# Patient Record
Sex: Female | Born: 1990 | Race: Black or African American | Hispanic: No | Marital: Married | Smoking: Never smoker
Health system: Southern US, Community
[De-identification: ages and names within clinical notes are randomized; demographics above are authoritative.]

## PROBLEM LIST (undated history)

## (undated) ENCOUNTER — Emergency Department: Admission: EM | Payer: Managed Care, Other (non HMO) | Source: Home / Self Care

## (undated) DIAGNOSIS — I10 Essential (primary) hypertension: Secondary | ICD-10-CM

## (undated) DIAGNOSIS — F32A Depression, unspecified: Secondary | ICD-10-CM

## (undated) DIAGNOSIS — F329 Major depressive disorder, single episode, unspecified: Secondary | ICD-10-CM

## (undated) DIAGNOSIS — N390 Urinary tract infection, site not specified: Secondary | ICD-10-CM

## (undated) DIAGNOSIS — K759 Inflammatory liver disease, unspecified: Secondary | ICD-10-CM

## (undated) HISTORY — DX: Major depressive disorder, single episode, unspecified: F32.9

## (undated) HISTORY — DX: Depression, unspecified: F32.A

## (undated) HISTORY — DX: Essential (primary) hypertension: I10

## (undated) HISTORY — DX: Urinary tract infection, site not specified: N39.0

## (undated) HISTORY — PX: TONSILLECTOMY: SUR1361

---
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
## 2011-12-12 ENCOUNTER — Other Ambulatory Visit (HOSPITAL_COMMUNITY): Payer: Self-pay | Admitting: Obstetrics & Gynecology

## 2011-12-12 DIAGNOSIS — R102 Pelvic and perineal pain: Secondary | ICD-10-CM

## 2011-12-12 DIAGNOSIS — Z975 Presence of (intrauterine) contraceptive device: Secondary | ICD-10-CM

## 2011-12-17 ENCOUNTER — Ambulatory Visit (HOSPITAL_COMMUNITY)
Admission: RE | Admit: 2011-12-17 | Discharge: 2011-12-17 | Disposition: A | Discharge status: Home / Self Care | Payer: BC Managed Care – PPO | Source: Ambulatory Visit | Attending: Obstetrics & Gynecology | Admitting: Obstetrics & Gynecology

## 2011-12-17 DIAGNOSIS — Z975 Presence of (intrauterine) contraceptive device: Secondary | ICD-10-CM

## 2011-12-17 DIAGNOSIS — N949 Unspecified condition associated with female genital organs and menstrual cycle: Secondary | ICD-10-CM | POA: Insufficient documentation

## 2011-12-17 DIAGNOSIS — N83209 Unspecified ovarian cyst, unspecified side: Secondary | ICD-10-CM | POA: Insufficient documentation

## 2011-12-17 DIAGNOSIS — Z30431 Encounter for routine checking of intrauterine contraceptive device: Secondary | ICD-10-CM | POA: Insufficient documentation

## 2011-12-17 DIAGNOSIS — R102 Pelvic and perineal pain: Secondary | ICD-10-CM

## 2012-05-05 ENCOUNTER — Telehealth: Payer: Self-pay | Admitting: *Deleted

## 2012-05-05 NOTE — Telephone Encounter (Signed)
message

## 2012-05-05 NOTE — Telephone Encounter (Signed)
New patient will see you on 05/14/12 to have IUD removed, she had ultrasound done back in dec 2013( result in epic) due to pelvic pain and was told that her IUD was in placed but the strings are in her cervix? Patient is concerned if this will be a  problem with removal?  Please advise

## 2012-05-07 NOTE — Telephone Encounter (Signed)
Telephone call, states has had some problems with elevated blood pressure. Since she's had the IUD she is having more back pain, cramping, acne, and is generally not felt well. Would like removed. Will keep scheduled appointment.

## 2012-05-14 ENCOUNTER — Ambulatory Visit (INDEPENDENT_AMBULATORY_CARE_PROVIDER_SITE_OTHER): Payer: BC Managed Care – PPO | Admitting: Women's Health

## 2012-05-14 ENCOUNTER — Encounter: Payer: Self-pay | Admitting: Women's Health

## 2012-05-14 VITALS — BP 134/94 | Ht 71.0 in | Wt 250.0 lb

## 2012-05-14 DIAGNOSIS — L709 Acne, unspecified: Secondary | ICD-10-CM

## 2012-05-14 DIAGNOSIS — I10 Essential (primary) hypertension: Secondary | ICD-10-CM

## 2012-05-14 DIAGNOSIS — L708 Other acne: Secondary | ICD-10-CM

## 2012-05-14 DIAGNOSIS — Z309 Encounter for contraceptive management, unspecified: Secondary | ICD-10-CM

## 2012-05-14 DIAGNOSIS — Z975 Presence of (intrauterine) contraceptive device: Secondary | ICD-10-CM

## 2012-05-14 MED ORDER — CLINDAMYCIN PHOSPHATE 1 % EX GEL: Freq: Two times a day (BID) | CUTANEOUS | Status: DC

## 2012-05-14 NOTE — Progress Notes (Signed)
Patient ID: Samantha Glass, female   DOB: 10/17/1990, 22 y.o.   MRN: [id number] Presents requesting removal of Mirena due to cramping, back ache, fatigue, moodiness, weight gain and acne since placement in September 2013. Weight has been between 225 and 250 for past 5 years.  Blood pressure had elevations on combination birth control pills and since on Mirena. Started HCTZ one week ago,  some improvement. Sexually active with first partner, he had negative STD screen. Previously on Jolessa for menorrhagia, stopped due to increased blood pressure. Had a ultrasound at primary care that showed a IUD in proper place but strings inside os. Depression- Stress related. Managed by psychiatrist. 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  Exam: appears well, obese. Speculum exam: pink healthy cervix with no lesions, Mirena string visible at os.  Numerous complaints regarding Mirena IUD Depression Obesity Hypertension newly diagnosed  Plan: Discussed contraceptive options, reviewed cannot start combination birth control at this time due to blood pressure. Will continue Mirena until blood pressure is controlled with HCTZ and then consider switching to combination oral contraceptives. Clindamycin 1% gel apply topically twice daily for acne, and schedule appointment with dermatologist. Encouraged healthy diet and exercise for weight loss, blood pressure control, and overall health.  Will continue Wellbutrin XL 300 MG through transition to grad school then consider decreasing dose per psychiatrist.

## 2012-05-14 NOTE — Patient Instructions (Signed)

## 2012-06-04 ENCOUNTER — Ambulatory Visit (INDEPENDENT_AMBULATORY_CARE_PROVIDER_SITE_OTHER): Payer: BC Managed Care – PPO | Admitting: Women's Health

## 2012-06-04 ENCOUNTER — Encounter: Payer: Self-pay | Admitting: Women's Health

## 2012-06-04 VITALS — BP 134/90

## 2012-06-04 DIAGNOSIS — Z309 Encounter for contraceptive management, unspecified: Secondary | ICD-10-CM

## 2012-06-04 NOTE — Progress Notes (Signed)
Patient ID: Samantha Glass, female   DOB: 05/31/1990, 22 y.o.   MRN: [id number] Presents to have IUD removed. States has back pain week prior to her cycle, several days of light spotting with cramping. Has 2 weeks of feeling well. Currently on HCTZ for blood pressure which ranges from 130s/80s to 120s/70s. Today 134/90. One partner negative STD screen. 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  Exam: Appears well,  Contraception management Hypertension  Plan: Options reviewed, progestin only pill, nexplanon, condoms, after discussion in review of possible side effects declined having IUD removed. States may have more problems with other options. Instructed to call if would like to have removed and change contraception. Reviewed risks of blood clots and strokes with combination birth control pills with hypertension. Continue Motrin and Aleve as needed for discomfort.

## 2012-06-13 ENCOUNTER — Ambulatory Visit (INDEPENDENT_AMBULATORY_CARE_PROVIDER_SITE_OTHER): Payer: BC Managed Care – PPO | Admitting: Women's Health

## 2012-06-13 ENCOUNTER — Ambulatory Visit (INDEPENDENT_AMBULATORY_CARE_PROVIDER_SITE_OTHER): Payer: BC Managed Care – PPO

## 2012-06-13 ENCOUNTER — Other Ambulatory Visit: Payer: Self-pay | Admitting: Women's Health

## 2012-06-13 ENCOUNTER — Encounter: Payer: Self-pay | Admitting: Women's Health

## 2012-06-13 DIAGNOSIS — Z30431 Encounter for routine checking of intrauterine contraceptive device: Secondary | ICD-10-CM

## 2012-06-13 DIAGNOSIS — B373 Candidiasis of vulva and vagina: Secondary | ICD-10-CM

## 2012-06-13 DIAGNOSIS — N946 Dysmenorrhea, unspecified: Secondary | ICD-10-CM

## 2012-06-13 DIAGNOSIS — Z975 Presence of (intrauterine) contraceptive device: Secondary | ICD-10-CM

## 2012-06-13 MED ORDER — DOXYCYCLINE HYCLATE 100 MG PO CAPS: 100.0000 mg | ORAL_CAPSULE | Freq: Two times a day (BID) | ORAL | Status: DC

## 2012-06-13 MED ORDER — FLUCONAZOLE 150 MG PO TABS: 150.0000 mg | ORAL_TABLET | Freq: Once | ORAL | Status: DC

## 2012-06-13 MED ORDER — KETOROLAC TROMETHAMINE 30 MG/ML IJ SOLN
30.0000 mg | Freq: Once | INTRAMUSCULAR | Status: AC
  Administered 2012-06-13: 30 mg via INTRAVENOUS

## 2012-06-13 MED ORDER — DOXYCYCLINE HYCLATE 50 MG PO CAPS: 50.0000 mg | ORAL_CAPSULE | Freq: Two times a day (BID) | ORAL | Status: DC

## 2012-06-13 NOTE — Progress Notes (Signed)
Patient ID: Samantha Glass, female   DOB: 06-12-1990, 22 y.o.   MRN: [id number] Presents to have Mirena IUD removed. [facility]rena placed by [facility] and has had cramping, backache and spotting since placement for at least 2 weeks of every month. Hypertension on HCTZ per primary care. Denies vaginal discharge, urinary symptoms, abdominal pain or fever. Plans to use condoms for contraception.  Exam: Appears well, external genitalia within normal limits, has a 3 cm birthmark on right labia/unchanged. Speculum exam, IUD strings not visible attempted to grab strings in cervix unable. Dr. Lily Peer in and with ultrasound guidance removed Mirena IUD intact and discarded. Tolerated well,   Mirena IUD removed  Plan: Toradol 30 g IM for discomfort. Vibramycin 100 mg twice daily for 7 days #14, Diflucan 150 by mouth x1 dose if becomes symptomatic of yeast after antibiotic given. Plan B emergency contraception reviewed for condom failure.

## 2017-12-10 ENCOUNTER — Encounter (INDEPENDENT_AMBULATORY_CARE_PROVIDER_SITE_OTHER): Payer: Self-pay | Admitting: Family Medicine

## 2017-12-10 ENCOUNTER — Ambulatory Visit (FREE_STANDING_LABORATORY_FACILITY): Payer: BLUE CROSS/BLUE SHIELD | Admitting: Family Medicine

## 2017-12-10 VITALS — BP 129/84 | HR 88 | Temp 98.9°F | Ht 71.0 in | Wt 262.0 lb

## 2017-12-10 DIAGNOSIS — E6609 Other obesity due to excess calories: Secondary | ICD-10-CM

## 2017-12-10 DIAGNOSIS — I10 Essential (primary) hypertension: Secondary | ICD-10-CM

## 2017-12-10 DIAGNOSIS — Z6836 Body mass index (BMI) 36.0-36.9, adult: Secondary | ICD-10-CM

## 2017-12-10 DIAGNOSIS — N946 Dysmenorrhea, unspecified: Secondary | ICD-10-CM

## 2017-12-10 DIAGNOSIS — Z0000 Encounter for general adult medical examination without abnormal findings: Secondary | ICD-10-CM

## 2017-12-10 DIAGNOSIS — F329 Major depressive disorder, single episode, unspecified: Secondary | ICD-10-CM

## 2017-12-10 DIAGNOSIS — F32A Depression, unspecified: Secondary | ICD-10-CM

## 2017-12-10 DIAGNOSIS — D500 Iron deficiency anemia secondary to blood loss (chronic): Secondary | ICD-10-CM

## 2017-12-10 LAB — COMPREHENSIVE METABOLIC PANEL
ALT: 10 U/L (ref 0–55)
AST (SGOT): 11 U/L (ref 5–34)
Albumin/Globulin Ratio: 1 (ref 0.9–2.2)
Albumin: 3.7 g/dL (ref 3.5–5.0)
Alkaline Phosphatase: 62 U/L (ref 37–106)
BUN: 6 mg/dL — ABNORMAL LOW (ref 7.0–19.0)
Bilirubin, Total: 0.4 mg/dL (ref 0.2–1.2)
CO2: 27 mEq/L (ref 21–29)
Calcium: 9.3 mg/dL (ref 8.5–10.5)
Chloride: 102 mEq/L (ref 100–111)
Creatinine: 0.8 mg/dL (ref 0.4–1.5)
Globulin: 3.8 g/dL — ABNORMAL HIGH (ref 2.0–3.7)
Glucose: 88 mg/dL (ref 70–100)
Potassium: 3.9 mEq/L (ref 3.5–5.1)
Protein, Total: 7.5 g/dL (ref 6.0–8.3)
Sodium: 139 mEq/L (ref 136–145)

## 2017-12-10 LAB — CBC AND DIFFERENTIAL
Absolute NRBC: 0 10*3/uL (ref 0.00–0.00)
Basophils Absolute Automated: 0.03 10*3/uL (ref 0.00–0.08)
Basophils Automated: 0.3 %
Eosinophils Absolute Automated: 0.09 10*3/uL (ref 0.00–0.44)
Eosinophils Automated: 1 %
Hematocrit: 41.8 % (ref 34.7–43.7)
Hgb: 13.6 g/dL (ref 11.4–14.8)
Immature Granulocytes Absolute: 0.03 10*3/uL (ref 0.00–0.07)
Immature Granulocytes: 0.3 %
Lymphocytes Absolute Automated: 2.14 10*3/uL (ref 0.42–3.22)
Lymphocytes Automated: 24.3 %
MCH: 30 pg (ref 25.1–33.5)
MCHC: 32.5 g/dL (ref 31.5–35.8)
MCV: 92.1 fL (ref 78.0–96.0)
MPV: 9.9 fL (ref 8.9–12.5)
Monocytes Absolute Automated: 0.57 10*3/uL (ref 0.21–0.85)
Monocytes: 6.5 %
Neutrophils Absolute: 5.94 10*3/uL (ref 1.10–6.33)
Neutrophils: 67.6 %
Nucleated RBC: 0 /100 WBC (ref 0.0–0.0)
Platelets: 344 10*3/uL (ref 142–346)
RBC: 4.54 10*6/uL (ref 3.90–5.10)
RDW: 13 % (ref 11–15)
WBC: 8.8 10*3/uL (ref 3.10–9.50)

## 2017-12-10 LAB — HEMOLYSIS INDEX: Hemolysis Index: 3 (ref 0–18)

## 2017-12-10 LAB — TSH: TSH: 1.97 u[IU]/mL (ref 0.35–4.94)

## 2017-12-10 LAB — LIPID PANEL
Cholesterol / HDL Ratio: 2.5
Cholesterol: 165 mg/dL (ref 0–199)
HDL: 66 mg/dL (ref 40–9999)
LDL Calculated: 64 mg/dL (ref 0–99)
Triglycerides: 174 mg/dL — ABNORMAL HIGH (ref 34–149)
VLDL Calculated: 35 mg/dL (ref 10–40)

## 2017-12-10 LAB — GFR: EGFR: >60

## 2017-12-10 MED ORDER — AMLODIPINE BESYLATE 10 MG PO TABS
10.0000 mg | ORAL_TABLET | Freq: Every day | ORAL | 1 refills | Status: DC
Start: 2017-12-10 — End: 2018-05-27

## 2017-12-10 MED ORDER — BUPROPION HCL ER (XL) 300 MG PO TB24
300.00 mg | ORAL_TABLET | Freq: Every morning | ORAL | 1 refills | Status: DC
Start: 2017-12-10 — End: 2018-05-27

## 2017-12-10 MED ORDER — HYDROCHLOROTHIAZIDE 25 MG PO TABS
25.0000 mg | ORAL_TABLET | Freq: Every day | ORAL | 1 refills | Status: DC
Start: 2017-12-10 — End: 2018-05-27

## 2017-12-10 NOTE — Patient Instructions (Signed)
Eating Heart-Healthy Foods  Eating has a big impact on your heart health. In fact, eating healthier can improve several of your heart risks at once. For instance, it helps you manage weight, cholesterol, and blood pressure. Here are ideas to help you make heart-healthy changes without giving up allthe foods and flavors you love.  Getting started   Talk with your healthcare provider about eating plans, such as the DASH or Mediterranean diet. You may also be referred to a dietitian.   Change a few things at a time. Give yourself time to get used to a few eating changes before adding more.   Work to create a tasty, healthy eating plan that you can stick to for the rest of your life.    Goals for healthy eating  Below are some tips to improve your eating habits:   Limit saturated fats and trans fats. Saturated fats raise your levels of cholesterol, so keep these fats to a minimum. They are found in foods such as fatty meats, whole milk, cheese, and palm and coconut oils. Avoid trans fats because they lower good cholesterol as well as raise bad cholesterol. Trans fats are most often found in processed foods.   Reduce sodium (salt) intake. Eating too much salt may increase your blood pressure. Limit your sodium intake to 2,300 milligrams (mg) per day(the amount in 1 teaspoon of salt), or less if your healthcare provider recommends it. Dining out less often and eating fewer processed foods are two great ways to decrease the amount of salt you consume.   Managing calories. A calorie is a unit of energy. Your body burns calories for fuel, but if you eat more calories than your body burns, the extras are stored as fat. Your healthcare provider can help you create a diet plan to manage your calories. This will likely include eating healthier foods as well as exercising regularly. To help you track your progress, keep a diary to record what you eat and how often you exercise.  Choose the right foods  Aim to make these  foods staples of your diet. If you have diabetes, you may have different recommendations than what is listed here:   Fruits and vegetables provide plenty of nutrients without a lot of calories. At meals, fill half your plate with these foods. Split the other half of your plate between whole grains and lean protein.   Whole grains are high in fiber and rich in vitamins and nutrients. Good choices include whole-wheat bread, pasta, and brown rice.   Lean proteins give you nutrition with less fat. Good choices include fish, skinless chicken, and beans.   Low-fat or nonfat dairy provides nutrients without a lot of fat. Try low-fat or nonfat milk, cheese, or yogurt.   Healthy fats can be good for you in small amounts. These are unsaturated fats, such as olive oil, nuts, and fish. Try to have at least 2 servings per week of fatty fish, such as salmon, sardines, mackerel, rainbow trout, and albacore tuna. These contain omega-3 fatty acids, which are good for your heart. Flaxseed is another source of a heart-healthy fat.  More on heart-healthy eating  Read food labels  Healthy eating starts at the grocery store. Be sure to pay attention to food labels on packaged foods. Look for products that are high in fiber and protein, and low in saturated fat, cholesterol, and sodium. Avoid products that contain trans fat. And pay close attention to serving size. For instance, if you plan   to eat two servings, double all the numbers on the label.  Prepare food right  A key part of healthy cooking is cutting down on added fat and salt. Look on the internet for lower-fat, lower-sodium recipes. Also, try these tips:   Remove fat from meat and skin from poultry before cooking.   Skim fat from the surface of soups and sauces.   Broil, boil, bake, steam, grill, and microwave food without added fats.   Choose ingredients that spice up your food without adding calories, fat, or sodium. Try these items: horseradish, hot sauce, lemon,  mustard, nonfat salad dressings, and vinegar. For salt-free herbs and spices, try basil, cilantro, cinnamon, pepper, and rosemary.  [employer]Well last reviewed this educational content on 10/02/2015   2000-2019 [employer] [address], [place] [address]. All rights reserved. This information is not intended as a substitute for professional medical care. Always follow your healthcare professional's instructions.        Aerobic Exercise for a Healthy Heart  Exercise is a lot more than an energy booster and a stress reliever. It also strengthens your heart muscle, lowers your blood pressure and cholesterol, and burns calories. It can also improve your resting muscle tone, and your mood.  Choose an aerobic activity  Choose an activity that makes your heart and lungs work harder than they do when you rest or walk normally. This aerobic exercise can improve the way your heart and other muscles use oxygen. Make it fun by exercising with a friend and choosing an activity you enjoy. Here are some ideas:   Walking   Swimming   Bicycling   Stair climbing   Dancing   Jogging   Gardening  Exercise regularly  If you haven't been exercising regularly, get your doctor's OK first. Then start slowly. Here are some tips:   Begin exercising 3 times a week for 5 to 10minutes at a time.   When you feel comfortable, add a few minutes each session.   Slowly build up to exercising 3 to 4 times each week. Each session should last for 40 minutes, on average, and involve moderate- to vigorous-intensity physical activity.   Your goal should be at least 30 minutes of moderate-intensity aerobic activity at least 5 days per week for a total of 150 or at least 25 minutes of vigorous aerobic activity at least 3 days per week for a total of 75 minutes   If you have been given nitroglycerin, be sure to carry it when you exercise.   If you get chest pain (angina) when you're exercising, stop what you're doing, take  your nitroglycerin, and call your doctor.  StayWell last reviewed this educational content on 06/03/2014   2000-2019 [employer] [address], [place] [address]. All rights reserved. This information is not intended as a substitute for professional medical care. Always follow your healthcare professional's instructions.

## 2017-12-10 NOTE — Progress Notes (Signed)
Have you seen any specialists/other providers since your last visit with [place]?    No    Arm preference verified?   Yes, no preference    Health Maintenance Due   Topic Date Due   . PAP SMEAR  Jun 28, 1990

## 2017-12-10 NOTE — Progress Notes (Signed)
Subjective:      Patient ID: Ashley Brock is a 27 y.o. female.    Chief Complaint:  Chief Complaint   Patient presents with   . Annual Exam     fasting       HPI:  Patient presents to clinic to establish care.     Was previously a Consulting civil engineer at [employer] and was receiving her primary care there.     Her last gynecologist visit was in [place] and was seen in 2016. She has a PMHx of dysmenorrhea since 2011 and eczema that she treats with OTC steroid ointment. She does take birth control and has been for 11 years, to control her menstrual cycle and accompanying pain. She still experiences mild cramping but otherwise her symptoms are controlled with the birth control.       Hypertension   The patient is being seen for a routine follow-up of hypertension. Hypertension is classified as chronic, essential and stage 1 hypertension. Comorbid illnesses include hyperlipidemia, diabetes and obesity. There are no recent interval events.  There is no interval history of chest pain, dyspnea and lower extremity edema.  Current therapy includes calcium channel blockers and diuretics. Patient is compliant with the current regimen.   The patient's blood pressure response to medications is at goal. Lifestyle changes have included improved nutrition. The patient reports exercising 60 min/day, 1-2 days/week. kicking She's been doing low impact exercises the last few weeks since injuring her knee        The following sections were reviewed this encounter by the provider:   Tobacco  Allergies  Meds  Problems  Med Hx  Surg Hx  Fam Hx  Soc Hx          Allergies:  No Known Allergies    Current Medications:  Current Outpatient Medications   Medication Sig Dispense Refill   . amLODIPine (NORVASC) 10 MG tablet Take 1 tablet (10 mg total) by mouth daily 90 tablet 1   . buPROPion XL (WELLBUTRIN XL) 300 MG 24 hr tablet Take 1 tablet (300 mg total) by mouth every morning 90 tablet 1   . hydroCHLOROthiazide (HYDRODIURIL) 25 MG tablet  Take 1 tablet (25 mg total) by mouth daily 90 tablet 1   . iron-vitamin C (VITRON-C) 65-125 MG Tab Take by mouth     . Norgestim-Eth Estrad Triphasic (TRI-LO-MARZIA) 0.18/0.215/0.25 MG-25 MCG Tab        No current facility-administered medications for this visit.        Past Medical History:  Past Medical History:   Diagnosis Date   . Depression    . Hypertension        Past Surgical History:  Past Surgical History:   Procedure Laterality Date   . TONSILLECTOMY  2009   . TONSILLECTOMY Bilateral 2009       Family History:  Family History   Problem Relation Age of Onset   . Hypertension Mother    . Hypertension Father    . No known problems Brother    . No known problems Maternal Grandmother    . Alcohol abuse Maternal Grandfather    . Hyperlipidemia Maternal Grandfather    . Hypertension Paternal Grandmother    . Mental illness Paternal Grandmother    . No known problems Paternal Grandfather        Social History:  Social History     Socioeconomic History   . Marital status: Married     Spouse name: Not on  file   . Number of children: Not on file   . Years of education: 21 years   . Highest education level: Doctorate   Occupational History   . Occupation: Office manager    Social Needs   . Financial resource strain: Not hard at all   . Food insecurity:     Worry: Never true     Inability: Never true   . Transportation needs:     Medical: No     Non-medical: No   Tobacco Use   . Smoking status: Never Smoker   . Smokeless tobacco: Never Used   Substance and Sexual Activity   . Alcohol use: Yes     Alcohol/week: 2.0 standard drinks     Types: 2 Glasses of wine per week   . Drug use: Not Currently   . Sexual activity: Yes     Partners: Male     Birth control/protection: OCP   Lifestyle   . Physical activity:     Days per week: Not on file     Minutes per session: Not on file   . Stress: Not on file   Relationships   . Social connections:     Talks on phone: Not on file     Gets together: Not on file     Attends  religious service: Not on file     Active member of club or organization: Not on file     Attends meetings of clubs or organizations: Not on file     Relationship status: Not on file   . Intimate partner violence:     Fear of current or ex partner: Not on file     Emotionally abused: Not on file     Physically abused: Not on file     Forced sexual activity: Not on file   Other Topics Concern   . Not on file   Social History Narrative    She was doing kick boxing for 1 hr per session. 2 days/week         ROS:  Review of Systems   Constitutional: Negative for activity change, appetite change, fever and unexpected weight change.   HENT: Negative for rhinorrhea.    Respiratory: Negative for cough and shortness of breath.    Cardiovascular: Negative for chest pain.   Genitourinary: Positive for menstrual problem (dysmenorrhea since 2011).   Skin: Negative for rash.   All other systems reviewed and are negative.       Vitals:  BP 129/84 (BP Site: Right arm, Patient Position: Sitting, Cuff Size: Large)   Pulse 88   Temp 98.9 F (37.2 C) (Oral)   Ht 1.803 m (5\' 11" )   Wt 118.8 kg (262 lb)   LMP 11/21/2017   BMI 36.54 kg/m     Objective:     Physical Exam:  Physical Exam  Vitals signs reviewed.   Constitutional:       General: She is not in acute distress.     Appearance: Normal appearance. She is obese.   HENT:      Head: Atraumatic.      Right Ear: Tympanic membrane, ear canal and external ear normal.      Left Ear: Tympanic membrane, ear canal and external ear normal.      Nose: Nose normal.      Mouth/Throat:      Mouth: Mucous membranes are moist.      Pharynx: Oropharynx is clear.   Eyes:  General: No scleral icterus.     Conjunctiva/sclera: Conjunctivae normal.      Pupils: Pupils are equal, round, and reactive to light.   Neck:      Musculoskeletal: Neck supple.   Cardiovascular:      Rate and Rhythm: Normal rate and regular rhythm.      Pulses: Normal pulses.           Radial pulses are 2+ on the right  side and 2+ on the left side.        Dorsalis pedis pulses are 2+ on the right side and 2+ on the left side.      Heart sounds: Normal heart sounds. No murmur.   Pulmonary:      Effort: Pulmonary effort is normal. No respiratory distress.      Breath sounds: Normal breath sounds.   Abdominal:      General: Bowel sounds are normal.      Palpations: Abdomen is soft.      Tenderness: There is no tenderness.   Musculoskeletal:      Right lower leg: No edema.      Left lower leg: No edema.   Skin:     General: Skin is warm and dry.      Comments: Per patient, notes eczema      Neurological:      Mental Status: She is alert and oriented to person, place, and time.      GCS: GCS eye subscore is 4. GCS verbal subscore is 5. GCS motor subscore is 6.   Psychiatric:         Attention and Perception: Attention normal.         Mood and Affect: Mood normal.         Behavior: Behavior normal. Behavior is cooperative.         Assessment:       1. Annual physical exam  - CBC and differential  - Comprehensive Metabolic Panel  - Hemoglobin A1c  - Lipid panel  - TSH    2. Essential hypertension  - amLODIPine (NORVASC) 10 MG tablet; Take 1 tablet (10 mg total) by mouth daily  Dispense: 90 tablet; Refill: 1  - hydroCHLOROthiazide (HYDRODIURIL) 25 MG tablet; Take 1 tablet (25 mg total) by mouth daily  Dispense: 90 tablet; Refill: 1    3. Dysmenorrhea    4. Iron deficiency anemia due to chronic blood loss    5. Depression, unspecified depression type  - buPROPion XL (WELLBUTRIN XL) 300 MG 24 hr tablet; Take 1 tablet (300 mg total) by mouth every morning  Dispense: 90 tablet; Refill: 1    6. Class 2 obesity due to excess calories without serious comorbidity with body mass index (BMI) of 36.0 to 36.9 in adult      Plan:     Cardiovascular risk assessment labs ordered today. She didn't want to have her pap today, she'll return for that in the next few months. She reports a h/o normal pap smears in the past.    New to me, but a chronic  condition. Relatively controlled on amlodipine 10mg  and 25mg  HCTZ daily. Continue heart healthy lifestyle habits and gradually return to moderate intensity exercise as knee pain resolves.     New to me, chronic to patient. Persisting. Continue current OCP regimen (Tri-lo-marzia). She gets this script filled by a remote provider.     New to me, but chronic. CBC ordered to evaluate H/H. Takes vitron C. Continue to  monitor.     New to me, but a chronic condition. Stable and improved on wellbutrin 300mg  once daily.     New to me, but a chronic problem. Persisting. Heart healthy habits and weight loss efforts reviewed. Advised to reduced intake of carbohydrates (starches, sugars), decrease the consumption of sweetened beverages, and monitor portion control. Increase physical activity.     Flu vaccine was declined in office today. Universal precautions to reduce infections reviewed.        Return in about 3 months (around 03/11/2018) for pap smear.    By signing my name below, I, Takia Runyon, Scribe, attest that this documentation has been prepared under the direction and in the presence of Geradine Girt, MD.    I have reviewed the chart and agree that the record accurately reflects my personal performance of the history, physical exam, discussion and plan.

## 2017-12-11 ENCOUNTER — Encounter (INDEPENDENT_AMBULATORY_CARE_PROVIDER_SITE_OTHER): Payer: Self-pay | Admitting: Family Medicine

## 2017-12-11 LAB — HEMOGLOBIN A1C
Average Estimated Glucose: 96.8 mg/dL
Hemoglobin A1C: 5 % (ref 4.6–5.9)

## 2017-12-16 ENCOUNTER — Telehealth (INDEPENDENT_AMBULATORY_CARE_PROVIDER_SITE_OTHER): Payer: Self-pay | Admitting: Family Medicine

## 2017-12-16 NOTE — Telephone Encounter (Signed)
Patient called regarding recent results for labs. Please return call at noted phone number below and okay to leave detailed message with information.      Patient Preferred Callback Number: [phone]

## 2017-12-16 NOTE — Telephone Encounter (Signed)
Sp with Pt. Pt made aware.See result note

## 2018-02-12 ENCOUNTER — Encounter (INDEPENDENT_AMBULATORY_CARE_PROVIDER_SITE_OTHER): Payer: Self-pay | Admitting: Family Medicine

## 2018-02-12 ENCOUNTER — Ambulatory Visit (INDEPENDENT_AMBULATORY_CARE_PROVIDER_SITE_OTHER): Payer: Commercial Managed Care - POS | Admitting: Family Medicine

## 2018-02-12 VITALS — BP 133/84 | HR 106 | Temp 98.4°F | Ht 71.0 in | Wt 262.4 lb

## 2018-02-12 DIAGNOSIS — R1013 Epigastric pain: Secondary | ICD-10-CM

## 2018-02-12 LAB — CBC
Absolute NRBC: 0 10*3/uL (ref 0.00–0.00)
Hematocrit: 42.2 % (ref 34.7–43.7)
Hgb: 13.8 g/dL (ref 11.4–14.8)
MCH: 29.8 pg (ref 25.1–33.5)
MCHC: 32.7 g/dL (ref 31.5–35.8)
MCV: 91.1 fL (ref 78.0–96.0)
MPV: 10.3 fL (ref 8.9–12.5)
Nucleated RBC: 0 /100 WBC (ref 0.0–0.0)
Platelets: 357 10*3/uL — ABNORMAL HIGH (ref 142–346)
RBC: 4.63 10*6/uL (ref 3.90–5.10)
RDW: 12 % (ref 11–15)
WBC: 7.46 10*3/uL (ref 3.10–9.50)

## 2018-02-12 LAB — LIPASE: Lipase: 23 U/L (ref 8–78)

## 2018-02-12 NOTE — Patient Instructions (Signed)
Gastritis (Adult)    Gastritis isinflammation andirritation of the stomach lining. You can have it for a short time (acute) or be long lasting (chronic). Infection with bacteria calledH pylori most often causes gastritis.More than a third of people in the [place] have these bacteria in their bodies. In many cases,H pyloricauses no problems or symptoms. In some people, though, the infection irritates the stomach lining and causes gastritis. H. pylori may be diagnosed through blood, stool, or breath tests, we well as through biopsy during an endoscopy. Other causes of stomach irritation include drinking alcohol, smoking or chewing tobacco, or taking pain-relieving medicines called NSAIDs (such as aspirin or ibuprofen). Certain drugs (such as cocaine) and immune conditions can also cause gastritis.  Symptoms of gastritis can include:   Belly pain or bloating   Feeling full quickly   Loss of appetite   Nausea or vomiting   Vomiting blood or having black stools   Feeling more tired than usual  An inflamed and irritated stomach lining is more likely to develop a sore called an ulcer. To help prevent this, gastritis should be treated.  Home care  If needed, our healthcare provider may prescribe medicines. If you haveH pyloriinfection, treating it will likely relieve your symptoms. Other changes can help reduce stomach irritation and help it heal.   If you have been prescribed medicines forH pyloriinfection, take them as directed. Take all of the medicine until it is finished or your healthcare provider tells you to stop, even if you feel better.   Your healthcare provider may advise you not to take NSAIDs. If you take daily aspirin for your heart or other medical reasons, do not stop without talking to your healthcare provider first.   Don't drink alcohol.   Stop smoking. Smoking can irritate the stomach and delay healing. As much as possible, stay away from second hand smoke.  Follow-up care  Follow up  with your healthcare provider, or as advised by our staff. You may need testing to check for inflammation or an ulcer.  When to seek medical advice  Call your healthcare provider for any of the following:   Stomach pain that gets worse or moves to the lower right belly (appendix area)   Chest pain that appears or gets worse, or spreads to the back, neck, shoulder, or arm   Frequent vomiting (can't keep down liquids)   Blood in the stool or vomit (red or black in color)   Feeling weak or dizzy   Shortness of breath   Unexplained weight loss   Fever of 100.4F (38C) or higher, or as directed by your healthcare provider  [employer]Well last reviewed this educational content on 03/01/2016   2000-2019 [employer] [address], [place] [address]. All rights reserved. This information is not intended as a substitute for professional medical care. Always follow your healthcare professional's instructions.

## 2018-02-12 NOTE — Progress Notes (Signed)
Have you seen any specialists/other providers since your last visit with [place]?    Yes-ortho    Arm preference verified?   Yes, no preference    Health Maintenance Due   Topic Date Due   . PAP SMEAR  1990-09-06   . [facility] CARE PLAN LETTER  11-02-90

## 2018-02-12 NOTE — Progress Notes (Signed)
Subjective:      Patient ID: Ashley Brock is a 28 y.o. female.    Chief Complaint:  Chief Complaint   Patient presents with   . Abdominal Pain     Pt c/o upper mid abd pain x 2 weeks.        HPI:  Abdominal Pain   This is a new problem. Episode onset: 2 weeks ago. Episode frequency: Consistent pain, with slight gaps in between. The problem has been unchanged. The pain is located in the epigastric region. The abdominal pain does not radiate. Associated symptoms include nausea (slight). Pertinent negatives include no diarrhea, fever, hematuria or vomiting. Treatments tried: She has tried OTC treatments like Tums and ginger tea, which has provided mild relief. She reports having a similar episode a couple months ago, and believes her current episode may be due to a recent dietary modification with a lower caloric intake. There is no history of pancreatitis or ulcerative colitis.   Of note she also had an ankle sprain recently and had taken a high-dose ibuprofen regimen five weeks ago. She currently drinks two alcoholic beverages a week, and does not drink coffee.    The following sections were reviewed this encounter by the provider:        Current Medications:  Current Outpatient Medications   Medication Sig Dispense Refill   . amLODIPine (NORVASC) 10 MG tablet Take 1 tablet (10 mg total) by mouth daily 90 tablet 1   . buPROPion XL (WELLBUTRIN XL) 300 MG 24 hr tablet Take 1 tablet (300 mg total) by mouth every morning 90 tablet 1   . hydroCHLOROthiazide (HYDRODIURIL) 25 MG tablet Take 1 tablet (25 mg total) by mouth daily 90 tablet 1   . iron-vitamin C (VITRON-C) 65-125 MG Tab Take by mouth     . Norgestim-Eth Estrad Triphasic (TRI-LO-MARZIA) 0.18/0.215/0.25 MG-25 MCG Tab        No current facility-administered medications for this visit.        Allergies:  No Known Allergies      ROS:  Review of Systems   Constitutional: Negative for activity change, fever and unexpected weight change.   Respiratory: Negative for  cough and shortness of breath.    Cardiovascular: Negative for chest pain and palpitations.   Gastrointestinal: Positive for abdominal distention, abdominal pain, blood in stool (only a few times) and nausea (slight). Negative for diarrhea and vomiting.   Genitourinary: Negative for hematuria.        Objective:     Vitals:  BP 133/84 (BP Site: Left arm, Patient Position: Sitting, Cuff Size: Large)   Pulse (!) 106   Temp 98.4 F (36.9 C) (Oral)   Ht 1.803 m (5\' 11" )   Wt 119 kg (262 lb 6.4 oz)   LMP 01/12/2018   BMI 36.60 kg/m     Physical Exam:  Physical Exam  Vitals signs reviewed.   Constitutional:       Appearance: Normal appearance.   HENT:      Head: Atraumatic.      Nose: Nose normal.   Eyes:      Conjunctiva/sclera: Conjunctivae normal.   Neck:      Musculoskeletal: Neck supple.   Cardiovascular:      Rate and Rhythm: Normal rate and regular rhythm.      Pulses: Normal pulses.      Heart sounds: Normal heart sounds.   Pulmonary:      Effort: Pulmonary effort is normal.  Breath sounds: Normal breath sounds.   Skin:     General: Skin is warm and dry.   Neurological:      Mental Status: She is alert and oriented to person, place, and time.   Psychiatric:         Mood and Affect: Mood normal.         Behavior: Behavior normal.         Assessment:       1. Epigastric abdominal pain  - CBC without differential  - Lipase      Plan:     New. Likely gastritis. Start omeprazole therapy combined with dietary changes. Continue to avoid acidic/spicy foods, alcohol, and coffee to reduce inflammation. Continue taking Tums prn. CBC and lipase labs ordered to evaluate for pancreatitis, infection/inflammation. Continue to monitor.    Return in about 1 month (around 03/13/2018) for if symptoms worsen or fail to improve or sooner.    By signing my name below, I, Kayleanna Lorman Pandora Leiter, Scribe, attest that this documentation has been prepared under the direction and in the presence of Geradine Girt, MD.    I have reviewed the chart  and agree that the record accurately reflects my personal performance of the history, physical exam, discussion and plan.    Emelda Brothers, MD

## 2018-05-25 ENCOUNTER — Other Ambulatory Visit (INDEPENDENT_AMBULATORY_CARE_PROVIDER_SITE_OTHER): Payer: Self-pay | Admitting: Family Medicine

## 2018-05-25 DIAGNOSIS — I10 Essential (primary) hypertension: Secondary | ICD-10-CM

## 2018-05-25 DIAGNOSIS — F32A Depression, unspecified: Secondary | ICD-10-CM

## 2018-06-11 ENCOUNTER — Encounter (INDEPENDENT_AMBULATORY_CARE_PROVIDER_SITE_OTHER): Payer: Self-pay | Admitting: Family Medicine

## 2018-06-11 ENCOUNTER — Telehealth (INDEPENDENT_AMBULATORY_CARE_PROVIDER_SITE_OTHER): Payer: Commercial Managed Care - POS | Admitting: Family Medicine

## 2018-06-11 DIAGNOSIS — R0789 Other chest pain: Secondary | ICD-10-CM

## 2018-06-11 NOTE — Progress Notes (Signed)
Have you seen any specialists/other providers since your last visit with [place]?    No    Do you agree to telemedicine visit?  Yes    Arm preference verified?   No    Health Maintenance Due   Topic Date Due   . PAP SMEAR  08/22/1990   . [facility] CARE PLAN LETTER  1990/02/03

## 2018-06-13 NOTE — Progress Notes (Signed)
Subjective:      Patient ID: Ashley Brock is a 28 y.o. female.    Chief Complaint:  Chief Complaint   Patient presents with   . Arm Pain     Pt c/o left under arm pain x 3 weeks.        HPI:  Verbal consent has been obtained from the patient to conduct a video and telephone visit to minimize exposure to COVID-19: yes.    HPI    Left underam pain  New. Started a few weeks ago. No redness, heat or tenderness to palpation. She denies injury. No pain with range of motion. Hasn't tried anything to make her symptoms go away. She's unable to feel a lymph node.   .   The following sections were reviewed this encounter by the provider:   Allergies  Meds  Problems         Current Medications:  Current Outpatient Medications   Medication Sig Dispense Refill   . amLODIPine (NORVASC) 10 MG tablet TAKE 1 TABLET BY MOUTH EVERY DAY 90 tablet 0   . buPROPion XL (WELLBUTRIN XL) 300 MG 24 hr tablet TAKE 1 TABLET BY MOUTH EVERY DAY IN THE MORNING 90 tablet 0   . hydroCHLOROthiazide (HYDRODIURIL) 25 MG tablet TAKE 1 TABLET BY MOUTH EVERY DAY 90 tablet 0   . iron-vitamin C (VITRON-C) 65-125 MG Tab Take by mouth     . Norgestim-Eth Estrad Triphasic (TRI-LO-MARZIA) 0.18/0.215/0.25 MG-25 MCG Tab        No current facility-administered medications for this visit.        Allergies:  No Known Allergies      ROS:  Review of Systems   Constitutional: Negative for activity change and fever.   Respiratory: Negative for cough and shortness of breath.    Cardiovascular: Negative for chest pain.   Musculoskeletal:        Left axillary pain        Objective:     Vitals:  LMP 06/04/2018     Physical Exam:  Physical Exam  Constitutional:       General: She is not in acute distress.     Appearance: She is not ill-appearing.   HENT:      Right Ear: External ear normal.      Left Ear: External ear normal.   Eyes:      General: No scleral icterus.  Neck:      Musculoskeletal: Neck supple.   Pulmonary:      Effort: Pulmonary effort is normal.  No respiratory distress.   Chest:       Neurological:      Mental Status: She is alert and oriented to person, place, and time.         Assessment:       1. Left-sided chest wall pain      Plan:     New. Etiology unclear. Possibly muscle strain. Recommended she try heating the area, can ice it a swell and alternate it with heat. Take tylenol or ibuprofen prn. If symptoms persist, will have her come into the office, perform breast and chest exam. Imaging may be warranted. Continue to monitor.     Time spent in discussion: 5 minutes    Return in about 1 month (around 07/11/2018) for if symptoms worsen or fail to improve or sooner.    Emelda Brothers, MD

## 2018-08-21 ENCOUNTER — Other Ambulatory Visit (INDEPENDENT_AMBULATORY_CARE_PROVIDER_SITE_OTHER): Payer: Self-pay | Admitting: Family Medicine

## 2018-08-21 DIAGNOSIS — I10 Essential (primary) hypertension: Secondary | ICD-10-CM

## 2018-08-21 DIAGNOSIS — F32A Depression, unspecified: Secondary | ICD-10-CM

## 2018-08-25 ENCOUNTER — Other Ambulatory Visit (INDEPENDENT_AMBULATORY_CARE_PROVIDER_SITE_OTHER): Payer: Self-pay | Admitting: Family Medicine

## 2018-08-25 DIAGNOSIS — I10 Essential (primary) hypertension: Secondary | ICD-10-CM

## 2018-08-25 DIAGNOSIS — F32A Depression, unspecified: Secondary | ICD-10-CM

## 2018-08-26 ENCOUNTER — Other Ambulatory Visit (INDEPENDENT_AMBULATORY_CARE_PROVIDER_SITE_OTHER): Payer: Self-pay | Admitting: Family Medicine

## 2018-08-26 DIAGNOSIS — F32A Depression, unspecified: Secondary | ICD-10-CM

## 2018-08-26 DIAGNOSIS — I10 Essential (primary) hypertension: Secondary | ICD-10-CM

## 2018-11-22 ENCOUNTER — Other Ambulatory Visit (INDEPENDENT_AMBULATORY_CARE_PROVIDER_SITE_OTHER): Payer: Self-pay | Admitting: Family Medicine

## 2018-11-22 DIAGNOSIS — I10 Essential (primary) hypertension: Secondary | ICD-10-CM

## 2018-11-22 DIAGNOSIS — F32A Depression, unspecified: Secondary | ICD-10-CM

## 2018-12-24 ENCOUNTER — Telehealth: Payer: Self-pay | Admitting: Family Medicine

## 2018-12-24 NOTE — Telephone Encounter (Signed)
Pt stated that she took a double dosage of her medication (amlodipine and iron pill) and stated that she feels a drop in blood pressure.

## 2018-12-24 NOTE — Telephone Encounter (Signed)
Spoke with pt, she usually take Amlodipine 10 mg in the evening but this morning she accidentally took one tablet. Pt has been monitoring her BP 125/80, 113/78, most recent reading about 10 min ago 116/75. Per Dr. Everardo Beals pt advised to continue monitoring BP, hydrate well and pt can take her evening dose as normal. Pt verbalized understanding.

## 2018-12-24 NOTE — Telephone Encounter (Signed)
Sign

## 2019-01-02 HISTORY — PX: GALLBLADDER SURGERY: SHX652

## 2019-02-25 ENCOUNTER — Other Ambulatory Visit (INDEPENDENT_AMBULATORY_CARE_PROVIDER_SITE_OTHER): Payer: Self-pay | Admitting: Family Medicine

## 2019-02-25 DIAGNOSIS — I10 Essential (primary) hypertension: Secondary | ICD-10-CM

## 2019-02-25 DIAGNOSIS — F32A Depression, unspecified: Secondary | ICD-10-CM

## 2019-02-27 ENCOUNTER — Other Ambulatory Visit (INDEPENDENT_AMBULATORY_CARE_PROVIDER_SITE_OTHER): Payer: Self-pay | Admitting: Family Medicine

## 2019-02-27 DIAGNOSIS — I10 Essential (primary) hypertension: Secondary | ICD-10-CM

## 2019-03-20 ENCOUNTER — Ambulatory Visit: Payer: BC Managed Care – PPO | Attending: Internal Medicine

## 2019-03-20 DIAGNOSIS — Z23 Encounter for immunization: Secondary | ICD-10-CM

## 2019-03-20 NOTE — Progress Notes (Signed)
   Covid-19 Vaccination Clinic  Name:  JAVIONNA LEDER    MRN: [id number] DOB: 06-18-1990  03/20/2019  Ms. Pecina was observed post Covid-19 immunization for 15 minutes without incident. She was provided with Vaccine Information Sheet and instruction to access the V-Safe system.   Ms. Stormer was instructed to call 911 with any severe reactions post vaccine: Marland Kitchen Difficulty breathing  . Swelling of face and throat  . A fast heartbeat  . A bad rash all over body  . Dizziness and weakness   Immunizations Administered    Name Date Dose VIS Date Route   Pfizer COVID-19 Vaccine 03/20/2019  4:12 PM 0.3 mL 12/12/2018 Intramuscular   Manufacturer: [employer], [employer]   Lot: [id number]   NDC: [address]-7

## 2019-04-01 ENCOUNTER — Other Ambulatory Visit (INDEPENDENT_AMBULATORY_CARE_PROVIDER_SITE_OTHER): Payer: Self-pay | Admitting: Family Medicine

## 2019-04-01 DIAGNOSIS — I10 Essential (primary) hypertension: Secondary | ICD-10-CM

## 2019-04-01 NOTE — Telephone Encounter (Signed)
This electronic refill request was received for Dr. King who appears to be inactive in Epic. Your office have assisted this patient in the past.  Can you please review and reply to the pharmacy for this medication or refer to another provider?    Thank you,  Dolores Ouedraogo  Epic Ambulatory Certified System Analyst, Sr

## 2019-04-14 ENCOUNTER — Encounter: Payer: Self-pay | Admitting: Nurse Practitioner

## 2019-04-14 ENCOUNTER — Other Ambulatory Visit: Payer: Self-pay

## 2019-04-14 ENCOUNTER — Ambulatory Visit: Payer: Managed Care, Other (non HMO) | Admitting: Nurse Practitioner

## 2019-04-14 VITALS — BP 112/80 | HR 102 | Temp 97.9°F | Ht 71.0 in | Wt 245.2 lb

## 2019-04-14 DIAGNOSIS — I10 Essential (primary) hypertension: Secondary | ICD-10-CM

## 2019-04-14 DIAGNOSIS — E669 Obesity, unspecified: Secondary | ICD-10-CM

## 2019-04-14 DIAGNOSIS — E66811 Obesity, class 1: Secondary | ICD-10-CM

## 2019-04-14 DIAGNOSIS — F325 Major depressive disorder, single episode, in full remission: Secondary | ICD-10-CM | POA: Diagnosis not present

## 2019-04-14 DIAGNOSIS — D509 Iron deficiency anemia, unspecified: Secondary | ICD-10-CM

## 2019-04-14 DIAGNOSIS — D500 Iron deficiency anemia secondary to blood loss (chronic): Secondary | ICD-10-CM

## 2019-04-14 HISTORY — DX: Obesity, unspecified: E66.9

## 2019-04-14 HISTORY — DX: Iron deficiency anemia, unspecified: D50.9

## 2019-04-14 HISTORY — DX: Obesity, class 1: E66.811

## 2019-04-14 HISTORY — DX: Major depressive disorder, single episode, in full remission: F32.5

## 2019-04-14 MED ORDER — HYDROCHLOROTHIAZIDE 25 MG PO TABS: 25.0000 mg | ORAL_TABLET | Freq: Every day | ORAL | 3 refills | Status: DC

## 2019-04-14 MED ORDER — BUPROPION HCL ER (XL) 300 MG PO TB24: 300.0000 mg | ORAL_TABLET | Freq: Every day | ORAL | 3 refills | Status: DC

## 2019-04-14 MED ORDER — AMLODIPINE BESYLATE 10 MG PO TABS: 10.0000 mg | ORAL_TABLET | Freq: Every day | ORAL | 3 refills | Status: DC

## 2019-04-14 NOTE — Progress Notes (Addendum)
New Patient Office Visit  Subjective:  Patient ID: Samantha Glass, female    DOB: May 08, 1990  Age: 29 y.o. MRN: [id number]  CC:  Chief Complaint  Patient presents with  . Establish Care    HPI Samantha Glass is a 29 year old who presents to establish care.   HTN: She is a history of hypertension Dx 04/2012 and started on HCTZ. She presents today on HCTZ  25 mg daily, amlodipine 10 mg daily.  She is doing well on this therapy and has had no chest pain, palpitations, ankle swelling, shortness of breath, dizziness or lightheadedness.  Blood pressure is well controlled at home.  BP Readings from Last 3 Encounters:  04/14/19 112/80  06/04/12 134/90  05/14/12 (!) 134/94    Depression: She presents on Wellbutrin XL 300 mg daily.  Not seen Psych in a while but plans to est care- Dx with depression as a sophomore in colleg. She reports she is doing fairly well, but has had some increased stressors recently with moving, new job in [employer] pandemic. Married and lives with her husband and in safe relationship.   IDA: Vitron C- couple years. Hx of heavier menstrual cycles- super tampon in 2-3 hours x 7 days. She tried IUD- gained 30 lbs, hair loss, terrible cramps, punctured the uterus and removed.  She thinks IUD contributed to HTN. Needs PAP test: normal prior  Contraception: [place] birth control on- line  Obesity: BMI 34 Lost 20 lbs goal weight  is 220 using Noom.  Lives with husband.  She feels well, eating a healthy diet.  [place] 03/2017 and gets second vaccine  tomorrow   Past Medical History:  Diagnosis Date  . Depression   . Depression, major, single episode, complete remission ([place]) 04/14/2019  . Hypertension   . IDA (iron deficiency anemia) 04/14/2019  . Obesity (BMI 30.0-34.9) 04/14/2019  . UTI (urinary tract infection)     Past Surgical History:  Procedure Laterality Date  . TONSILLECTOMY  age 17    Family History  Problem Relation Age of Onset  .  Hypertension Mother   . Depression Mother   . Hypertension Maternal Grandmother   . Arthritis Maternal Grandmother   . Diabetes Paternal Grandmother   . Hypertension Paternal Grandmother   . Hyperlipidemia Paternal Grandmother   . Hypertension Paternal Grandfather     Social History   Socioeconomic History  . Marital status: Married    Spouse name: Not on file  . Number of children: Not on file  . Years of education: Not on file  . Highest education level: Not on file  Occupational History  . Not on file  Tobacco Use  . Smoking status: Never Smoker  . Smokeless tobacco: Never Used  Substance and Sexual Activity  . Alcohol use: Yes    Comment: occassioanlly  . Drug use: No  . Sexual activity: Yes  Other Topics Concern  . Not on file  Social History Narrative  . Not on file   Social Determinants of Health   Financial Resource Strain:   . Difficulty of Paying Living Expenses:   Food Insecurity:   . Worried About Charity fundraiser in the Last Year:   . Arboriculturist in the Last Year:   Transportation Needs:   . Film/video editor (Medical):   Marland Kitchen Lack of Transportation (Non-Medical):   Physical Activity:   . Days of Exercise per Week:   . Minutes of Exercise per Session:  Stress:   . Feeling of Stress :   Social Connections:   . Frequency of Communication with Friends and Family:   . Frequency of Social Gatherings with Friends and Family:   . Attends Religious Services:   . Active Member of Clubs or Organizations:   . Attends Archivist Meetings:   Marland Kitchen Marital Status:   Intimate Partner Violence:   . Fear of Current or Ex-Partner:   . Emotionally Abused:   Marland Kitchen Physically Abused:   . Sexually Abused:     ROS Review of Systems  Constitutional: Negative for chills and fever.  HENT: Negative for congestion and sore throat.   Eyes: Negative.   Respiratory: Negative for cough and shortness of breath.   Cardiovascular: Negative for chest pain,  palpitations and leg swelling.  Gastrointestinal: Negative for abdominal pain, blood in stool, constipation, diarrhea and nausea.  Endocrine: Negative for cold intolerance and heat intolerance.  Genitourinary: Negative for difficulty urinating, frequency and urgency.  Musculoskeletal: Negative.  Negative for arthralgias.  Skin: Negative.   Allergic/Immunologic: Positive for environmental allergies.       Occasional - no treatment needed.   Neurological: Positive for headaches. Negative for dizziness and light-headedness.       Bad HA once a month never Dx with migraines. She turns off lights and closes her eyes. Not noticed a/w menses, maybe a/w computer.   Hematological: Negative for adenopathy. Does not bruise/bleed easily.  Psychiatric/Behavioral:       Some depression feelings of sadness, loss of interest, wakes up that way-x1-3 times a month, trouble focusing on work. No SI or HI.     Objective:   Today's Vitals: BP 112/80   Pulse (!) 102   Temp 97.9 F (36.6 C) (Temporal)   Ht '5\' 11"'  (1.803 m)   Wt 245 lb 3.2 oz (111.2 kg)   BMI 34.20 kg/m   Physical Exam Vitals reviewed.  Constitutional:      Appearance: Normal appearance.  HENT:     Head: Normocephalic and atraumatic.  Eyes:     Extraocular Movements: Extraocular movements intact.     Conjunctiva/sclera: Conjunctivae normal.     Pupils: Pupils are equal, round, and reactive to light.  Cardiovascular:     Rate and Rhythm: Normal rate and regular rhythm.     Pulses: Normal pulses.     Heart sounds: Normal heart sounds.  Pulmonary:     Effort: Pulmonary effort is normal.     Breath sounds: Normal breath sounds.  Abdominal:     Palpations: Abdomen is soft.     Tenderness: There is no abdominal tenderness.  Musculoskeletal:        General: Normal range of motion.     Cervical back: Normal range of motion and neck supple.  Skin:    General: Skin is warm and dry.  Neurological:     General: No focal deficit  present.     Mental Status: She is alert and oriented to person, place, and time.  Psychiatric:        Attention and Perception: Attention normal.        Mood and Affect: Mood and affect normal.        Thought Content: Thought content normal.        Cognition and Memory: Cognition normal.        Judgment: Judgment normal.     Comments: PHQ-9:4      Assessment & Plan:   Problem List Items Addressed This  Visit      Cardiovascular and Mediastinum   Essential hypertension, benign - Primary   Relevant Medications   amLODipine (NORVASC) 10 MG tablet   hydrochlorothiazide (HYDRODIURIL) 25 MG tablet   Other Relevant Orders   Comp Met (CMET)   Lipid Profile     Other   Depression, major, single episode, complete remission (HCC)   Relevant Medications   buPROPion (WELLBUTRIN XL) 300 MG 24 hr tablet   Other Relevant Orders   Ambulatory referral to Psychiatry   Obesity (BMI 30.0-34.9)   Relevant Orders   TSH   Lipid Profile   HgB A1c   IDA (iron deficiency anemia)   Relevant Orders   CBC with Differential/Platelet   IBC + Ferritin      Outpatient Encounter Medications as of 04/14/2019  Medication Sig  . amLODipine (NORVASC) 10 MG tablet Take 1 tablet (10 mg total) by mouth daily.  Marland Kitchen buPROPion (WELLBUTRIN XL) 300 MG 24 hr tablet Take 1 tablet (300 mg total) by mouth daily.  . hydrochlorothiazide (HYDRODIURIL) 25 MG tablet Take 1 tablet (25 mg total) by mouth daily.  . Norgestimate-Ethinyl Estradiol Triphasic (TRI-SPRINTEC) 0.18/0.215/0.25 MG-35 MCG tablet Take 1 tablet by mouth daily.  . [DISCONTINUED] amLODipine (NORVASC) 10 MG tablet Take 10 mg by mouth daily.  . [DISCONTINUED] buPROPion (WELLBUTRIN XL) 300 MG 24 hr tablet Take 300 mg by mouth daily.  . [DISCONTINUED] hydrochlorothiazide (HYDRODIURIL) 25 MG tablet Take 25 mg by mouth daily.  . [DISCONTINUED] doxycycline (VIBRAMYCIN) 100 MG capsule Take 1 capsule (100 mg total) by mouth 2 (two) times daily. (Patient not taking:  Reported on 04/14/2019)  . [DISCONTINUED] fluconazole (DIFLUCAN) 150 MG tablet Take 1 tablet (150 mg total) by mouth once. (Patient not taking: Reported on 04/14/2019)  . [DISCONTINUED] hydrochlorothiazide (MICROZIDE) 12.5 MG capsule Take 12.5 mg by mouth daily.   No facility-administered encounter medications on file as of 04/14/2019.  I have refilled the medications that we discussed.  Please go to the lab today for routine blood work.   I placed a referral into Dr. Cherene Altes in psychiatry.  Her office will contact you for an appointment.  Let me know if you have not heard back from them in a week.  Good job with weight loss and continue with Noom.  Healthy eating and activity.  Follow-up in June for preventative healthcare including Pap Test.  Follow-up: Return in about 2 months (around 06/14/2019).   Denice Paradise, NP

## 2019-04-14 NOTE — Patient Instructions (Addendum)
It was a pleasure to meet you today.  I have refilled the medications that we discussed.  Please go to the lab today for routine blood work.   I placed a referral into Dr. Arvilla Market in psychiatry.  Her office will contact you for an appointment.  Let me know if you have not heard back from them in a week.  Good job with weight loss and continue with Noom.  Healthy eating and activity.  Follow-up in June for preventative healthcare including Pap Test.

## 2019-04-14 NOTE — Telephone Encounter (Signed)
hydroCHLOROthiazide (HYDRODIURIL) 25 MG tablet    Last Refilled: 03/04/19 (30 day supply)  Last Appt: 06/11/2018    Called s/w patient who stated that she [place] and has sinced found a new PCP.

## 2019-04-15 ENCOUNTER — Encounter: Payer: Self-pay | Admitting: Nurse Practitioner

## 2019-04-15 ENCOUNTER — Ambulatory Visit: Payer: Managed Care, Other (non HMO) | Attending: Internal Medicine

## 2019-04-15 DIAGNOSIS — Z23 Encounter for immunization: Secondary | ICD-10-CM

## 2019-04-15 LAB — COMPREHENSIVE METABOLIC PANEL
ALT: 14 U/L (ref 0–35)
AST: 14 U/L (ref 0–37)
Albumin: 4.4 g/dL (ref 3.5–5.2)
Alkaline Phosphatase: 52 U/L (ref 39–117)
BUN: 7 mg/dL (ref 6–23)
CO2: 28 mEq/L (ref 19–32)
Calcium: 9.8 mg/dL (ref 8.4–10.5)
Chloride: 99 mEq/L (ref 96–112)
Creatinine, Ser: 0.76 mg/dL (ref 0.40–1.20)
GFR: 109.06 mL/min (ref >60.00)
Glucose, Bld: 78 mg/dL (ref 70–99)
Potassium: 3.4 mEq/L — ABNORMAL LOW (ref 3.5–5.1)
Sodium: 136 mEq/L (ref 135–145)
Total Bilirubin: 0.6 mg/dL (ref 0.2–1.2)
Total Protein: 7.8 g/dL (ref 6.0–8.3)

## 2019-04-15 LAB — CBC WITH DIFFERENTIAL/PLATELET
Basophils Absolute: 0.1 10*3/uL (ref 0.0–0.1)
Basophils Relative: 0.8 % (ref 0.0–3.0)
Eosinophils Absolute: 0.1 10*3/uL (ref 0.0–0.7)
Eosinophils Relative: 0.9 % (ref 0.0–5.0)
HCT: 40.6 % (ref 36.0–46.0)
Hemoglobin: 13.6 g/dL (ref 12.0–15.0)
Lymphocytes Relative: 32.7 % (ref 12.0–46.0)
Lymphs Abs: 2.6 10*3/uL (ref 0.7–4.0)
MCHC: 33.4 g/dL (ref 30.0–36.0)
MCV: 91.8 fl (ref 78.0–100.0)
Monocytes Absolute: 0.6 10*3/uL (ref 0.1–1.0)
Monocytes Relative: 7 % (ref 3.0–12.0)
Neutro Abs: 4.7 10*3/uL (ref 1.4–7.7)
Neutrophils Relative %: 58.6 % (ref 43.0–77.0)
Platelets: 365 10*3/uL (ref 150.0–400.0)
RBC: 4.42 Mil/uL (ref 3.87–5.11)
RDW: 12.7 % (ref 11.5–15.5)
WBC: 8 10*3/uL (ref 4.0–10.5)

## 2019-04-15 LAB — LIPID PANEL
Cholesterol: 149 mg/dL (ref 0–200)
HDL: 58.3 mg/dL (ref >39.00)
LDL Cholesterol: 71 mg/dL (ref 0–99)
NonHDL: 90.97
Total CHOL/HDL Ratio: 3
Triglycerides: 98 mg/dL (ref 0.0–149.0)
VLDL: 19.6 mg/dL (ref 0.0–40.0)

## 2019-04-15 LAB — TSH: TSH: 1.95 u[IU]/mL (ref 0.35–4.50)

## 2019-04-15 LAB — IBC + FERRITIN
Ferritin: 25 ng/mL (ref 10.0–291.0)
Iron: 105 ug/dL (ref 42–145)
Saturation Ratios: 19.6 % — ABNORMAL LOW (ref 20.0–50.0)
Transferrin: 382 mg/dL — ABNORMAL HIGH (ref 212.0–360.0)

## 2019-04-15 LAB — HEMOGLOBIN A1C: Hgb A1c MFr Bld: 5.1 % (ref 4.6–6.5)

## 2019-04-15 NOTE — Progress Notes (Signed)
   Covid-19 Vaccination Clinic  Name:  Samantha Glass    MRN: [id number] DOB: 07-04-1990  04/15/2019  [name] was observed post Covid-19 immunization for 15 minutes without incident. She was provided with Vaccine Information Sheet and instruction to access the V-Safe system.   [name] was instructed to call 911 with any severe reactions post vaccine: Marland Kitchen Difficulty breathing  . Swelling of face and throat  . A fast heartbeat  . A bad rash all over body  . Dizziness and weakness   Immunizations Administered    Name Date Dose VIS Date Route   Pfizer COVID-19 Vaccine 04/15/2019  4:04 PM 0.3 mL 12/12/2018 Intramuscular   Manufacturer: [employer], [employer]   Lot: [id number]   NDC: [address]-1

## 2019-04-15 NOTE — Assessment & Plan Note (Addendum)
Check labs. No longer having heavy menses. Gets Contraception: [facility] birth control on- line Follow-up in June for preventative healthcare including Pap Test.

## 2019-04-15 NOTE — Assessment & Plan Note (Signed)
  Good job with weight loss and continue with Noom.  Healthy eating and activity.

## 2019-04-15 NOTE — Assessment & Plan Note (Signed)
Referral to psychiatry.  She is Wellbutrin XL 300 mg, still has some depression feelings, loss of interest,  trouble focusing at work.  Negative SI or HI.

## 2019-04-15 NOTE — Assessment & Plan Note (Signed)
Check labs and continue same medications HCTZ 25 mg and amlodipine 10 mg daily.  She has been working on lifestyle changes.  She has lost weight.  Continue with healthy lifestyle and exercise.

## 2019-05-01 ENCOUNTER — Other Ambulatory Visit (INDEPENDENT_AMBULATORY_CARE_PROVIDER_SITE_OTHER): Payer: Self-pay | Admitting: Family Medicine

## 2019-05-07 ENCOUNTER — Other Ambulatory Visit: Payer: Self-pay | Admitting: Nurse Practitioner

## 2019-06-09 ENCOUNTER — Other Ambulatory Visit: Payer: Self-pay

## 2019-06-09 ENCOUNTER — Encounter: Payer: Self-pay | Admitting: Nurse Practitioner

## 2019-06-09 ENCOUNTER — Ambulatory Visit: Payer: Managed Care, Other (non HMO) | Admitting: Nurse Practitioner

## 2019-06-09 VITALS — BP 118/82 | HR 96 | Temp 98.0°F | Ht 71.0 in | Wt 244.2 lb

## 2019-06-09 DIAGNOSIS — R233 Spontaneous ecchymoses: Secondary | ICD-10-CM | POA: Diagnosis not present

## 2019-06-09 NOTE — Progress Notes (Signed)
Established Patient Office Visit  Subjective:  Patient ID: Samantha Glass, female    DOB: 14-Feb-1990  Age: 29 y.o. MRN: [id number]  CC:  Chief Complaint  Patient presents with  . Acute Visit    red dots on feet and ankles    HPI Samantha Glass presents for tiny red spots that started on her bilat feet and ankles and has climbed up to her lower shins. Onset about 1 month ago. They are not itchy or raised. Over the month, they have spread up the legs to lower calf. A large patch developed on the right lower anterior leg-see photos. The spots are faint.   She has had no tick exposure past or present  and does not participate in outdoor activities.  No pets. No fevers or chills, melalgias, or joint pain. She has chronic headaches normally. Currently, by the end of the day, she will have a mild frontal headache that she thinks is from looking at the computer.  These may be just a little more prominent than normal, and sometimes she gets little temporal ache.  She does not have migraine headaches and does not have a headache now.  She does not have a history of sinus problems, or allergic rhinitis.   Past Medical History:  Diagnosis Date  . Depression   . Depression, major, single episode, complete remission ([place]) 04/14/2019  . Hypertension   . IDA (iron deficiency anemia) 04/14/2019  . Obesity (BMI 30.0-34.9) 04/14/2019  . UTI (urinary tract infection)     Past Surgical History:  Procedure Laterality Date  . TONSILLECTOMY  age 80    Family History  Problem Relation Age of Onset  . Hypertension Mother   . Depression Mother   . Hypertension Maternal Grandmother   . Arthritis Maternal Grandmother   . Diabetes Paternal Grandmother   . Hypertension Paternal Grandmother   . Hyperlipidemia Paternal Grandmother   . Hypertension Paternal Grandfather     Social History   Socioeconomic History  . Marital status: Married    Spouse name: Not on file  . Number of children: Not on  file  . Years of education: Not on file  . Highest education level: Not on file  Occupational History  . Not on file  Tobacco Use  . Smoking status: Never Smoker  . Smokeless tobacco: Never Used  Substance and Sexual Activity  . Alcohol use: Yes    Comment: occassioanlly  . Drug use: No  . Sexual activity: Yes  Other Topics Concern  . Not on file  Social History Narrative  . Not on file   Social Determinants of Health   Financial Resource Strain:   . Difficulty of Paying Living Expenses:   Food Insecurity:   . Worried About Charity fundraiser in the Last Year:   . Arboriculturist in the Last Year:   Transportation Needs:   . Film/video editor (Medical):   Marland Kitchen Lack of Transportation (Non-Medical):   Physical Activity:   . Days of Exercise per Week:   . Minutes of Exercise per Session:   Stress:   . Feeling of Stress :   Social Connections:   . Frequency of Communication with Friends and Family:   . Frequency of Social Gatherings with Friends and Family:   . Attends Religious Services:   . Active Member of Clubs or Organizations:   . Attends Archivist Meetings:   Marland Kitchen Marital Status:   Intimate Production manager  Violence:   . Fear of Current or Ex-Partner:   . Emotionally Abused:   Marland Kitchen Physically Abused:   . Sexually Abused:     Outpatient Medications Prior to Visit  Medication Sig Dispense Refill  . amLODipine (NORVASC) 10 MG tablet Take 1 tablet (10 mg total) by mouth daily. 30 tablet 3  . buPROPion (WELLBUTRIN XL) 300 MG 24 hr tablet TAKE 1 TABLET BY MOUTH EVERY DAY 90 tablet 2  . ferrous sulfate 325 (65 FE) MG tablet Take 325 mg by mouth daily with breakfast.    . hydrochlorothiazide (HYDRODIURIL) 25 MG tablet Take 1 tablet (25 mg total) by mouth daily. 30 tablet 3  . loratadine (CLARITIN) 10 MG tablet Take 10 mg by mouth daily.    . Norgestimate-Ethinyl Estradiol Triphasic (TRI-SPRINTEC) 0.18/0.215/0.25 MG-35 MCG tablet Take 1 tablet by mouth daily.     No  facility-administered medications prior to visit.    No Known Allergies     Review of systems: Pertinent positives as noted in history of present illness otherwise negative.  Objective:    Physical Exam  Constitutional: She is oriented to person, place, and time. She appears well-developed and well-nourished.  Cardiovascular: Normal rate and regular rhythm.  Pulmonary/Chest: Effort normal and breath sounds normal.  Musculoskeletal:        General: Normal range of motion.     Cervical back: Normal range of motion.  Neurological: She is alert and oriented to person, place, and time.  Skin: Skin is warm and dry. Rash noted.  Fine petechiae rash noted bilateral feet, ankles, lower calf region.  Skin was surveyed, and no other rash noted on the rest of the body. See photos.  Her hand placement shows the extent of the rash on the legs as it is below her fingertips.   Vitals reviewed.   BP 118/82 (BP Location: Left Arm, Patient Position: Sitting, Cuff Size: Large)   Pulse 96   Temp 98 F (36.7 C) (Skin)   Ht '5\' 11"'  (1.803 m)   Wt 244 lb 3.2 oz (110.8 kg)   SpO2 99%   BMI 34.06 kg/m  Wt Readings from Last 3 Encounters:  06/09/19 244 lb 3.2 oz (110.8 kg)  04/14/19 245 lb 3.2 oz (111.2 kg)  05/14/12 250 lb (113.4 kg)        Health Maintenance Due  Topic Date Due  . Hepatitis C Screening  Never done  . HIV Screening  Never done  . TETANUS/TDAP  Never done  . PAP-Cervical Cytology Screening  Never done  . PAP SMEAR-Modifier  Never done    There are no preventive care reminders to display for this patient.  Lab Results  Component Value Date   TSH 1.95 04/14/2019   Lab Results  Component Value Date   WBC 7.8 06/10/2019   HGB 13.4 06/10/2019   HCT 40.4 06/10/2019   MCV 91.4 06/10/2019   PLT 338 06/10/2019   Lab Results  Component Value Date   NA 137 06/10/2019   K 3.7 06/10/2019   CO2 28 06/10/2019   GLUCOSE 92 06/10/2019   BUN 6 06/10/2019   CREATININE 0.79  06/10/2019   BILITOT 0.6 06/10/2019   ALKPHOS 48 06/10/2019   AST 15 06/10/2019   ALT 17 06/10/2019   PROT 7.9 06/10/2019   ALBUMIN 4.1 06/10/2019   CALCIUM 9.3 06/10/2019   ANIONGAP 9 06/10/2019   GFR 109.06 04/14/2019   Lab Results  Component Value Date   CHOL 149 04/14/2019  Lab Results  Component Value Date   HDL 58.30 04/14/2019   Lab Results  Component Value Date   LDLCALC 71 04/14/2019   Lab Results  Component Value Date   TRIG 98.0 04/14/2019   Lab Results  Component Value Date   CHOLHDL 3 04/14/2019   Lab Results  Component Value Date   HGBA1C 5.1 04/14/2019      Assessment & Plan:   Problem List Items Addressed This Visit    None    Visit Diagnoses    Petechiae    -  Primary   Relevant Orders   CBC with Differential/Platelet (Completed)   Comp Met (CMET)   PT and PTT   Urinalysis, Routine w reflex microscopic (Completed)     Patient has lower extremity fine, faint-red,  non pruritic, petechiae.  It involves the bilateral feet, ankles to the lower calf in symmetrical distribution.  Right lower anterior shin area has a more dense cluster than on the left.  She has no rash on her palms, or anywhere else on her body.  There has been no tick exposure, and she does not  participate in any outdoor activity.  No indoor pets. She denies any fevers or myalgias.  She has chronic mild headaches at the end of the day on the computer which may be  more frequent than her baseline, but not unusual.  She does not feel ill.  Question whether she had worn tight leggings or support socks that had stopped at this level.  Patient  denies.    We will check basic laboratory studies.   Further recommendations pending results.  06/10/2019: Addendum: Laboratory studies returned unremarkable.  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  We repeat the urine in a month.  Referral to dermatology placed in telephone message.   No orders of the defined types were placed in this  encounter.  Follow-up: No follow-ups on file.  This visit occurred during the SARS-CoV-2 public health emergency.  Safety protocols were in place, including screening questions prior to the visit, additional usage of staff PPE, and extensive cleaning of exam room while observing appropriate contact time as indicated for disinfecting solutions.    Denice Paradise, NP

## 2019-06-09 NOTE — Patient Instructions (Signed)
Please go to the [facility] entrance to the outpatient lab.  I will call you when the labs result.

## 2019-06-10 ENCOUNTER — Other Ambulatory Visit: Payer: Self-pay | Admitting: Nurse Practitioner

## 2019-06-10 ENCOUNTER — Other Ambulatory Visit
Admission: RE | Admit: 2019-06-10 | Discharge: 2019-06-10 | Disposition: A | Discharge status: Home / Self Care | Payer: Managed Care, Other (non HMO) | Source: Ambulatory Visit | Attending: *Deleted | Admitting: *Deleted

## 2019-06-10 ENCOUNTER — Encounter: Payer: Self-pay | Admitting: Nurse Practitioner

## 2019-06-10 DIAGNOSIS — R233 Spontaneous ecchymoses: Secondary | ICD-10-CM | POA: Diagnosis present

## 2019-06-10 DIAGNOSIS — R821 Myoglobinuria: Secondary | ICD-10-CM

## 2019-06-10 LAB — COMPREHENSIVE METABOLIC PANEL
ALT: 17 U/L (ref 0–44)
AST: 15 U/L (ref 15–41)
Albumin: 4.1 g/dL (ref 3.5–5.0)
Alkaline Phosphatase: 48 U/L (ref 38–126)
Anion gap: 9 (ref 5–15)
BUN: 6 mg/dL (ref 6–20)
CO2: 28 mmol/L (ref 22–32)
Calcium: 9.3 mg/dL (ref 8.9–10.3)
Chloride: 100 mmol/L (ref 98–111)
Creatinine, Ser: 0.79 mg/dL (ref 0.44–1.00)
GFR calc Af Amer: >60 mL/min (ref >60)
GFR calc non Af Amer: >60 mL/min (ref >60)
Glucose, Bld: 92 mg/dL (ref 70–99)
Potassium: 3.7 mmol/L (ref 3.5–5.1)
Sodium: 137 mmol/L (ref 135–145)
Total Bilirubin: 0.6 mg/dL (ref 0.3–1.2)
Total Protein: 7.9 g/dL (ref 6.5–8.1)

## 2019-06-10 LAB — CBC WITH DIFFERENTIAL/PLATELET
Abs Immature Granulocytes: 0.01 10*3/uL (ref 0.00–0.07)
Basophils Absolute: 0 10*3/uL (ref 0.0–0.1)
Basophils Relative: 0 %
Eosinophils Absolute: 0.2 10*3/uL (ref 0.0–0.5)
Eosinophils Relative: 3 %
HCT: 40.4 % (ref 36.0–46.0)
Hemoglobin: 13.4 g/dL (ref 12.0–15.0)
Immature Granulocytes: 0 %
Lymphocytes Relative: 27 %
Lymphs Abs: 2.1 10*3/uL (ref 0.7–4.0)
MCH: 30.3 pg (ref 26.0–34.0)
MCHC: 33.2 g/dL (ref 30.0–36.0)
MCV: 91.4 fL (ref 80.0–100.0)
Monocytes Absolute: 0.5 10*3/uL (ref 0.1–1.0)
Monocytes Relative: 7 %
Neutro Abs: 4.9 10*3/uL (ref 1.7–7.7)
Neutrophils Relative %: 63 %
Platelets: 338 10*3/uL (ref 150–400)
RBC: 4.42 MIL/uL (ref 3.87–5.11)
RDW: 12.2 % (ref 11.5–15.5)
WBC: 7.8 10*3/uL (ref 4.0–10.5)
nRBC: 0 % (ref 0.0–0.2)

## 2019-06-10 LAB — URINALYSIS, ROUTINE W REFLEX MICROSCOPIC
Bacteria, UA: NONE SEEN
Bilirubin Urine: NEGATIVE
Glucose, UA: NEGATIVE mg/dL
Ketones, ur: NEGATIVE mg/dL
Leukocytes,Ua: NEGATIVE
Nitrite: NEGATIVE
Protein, ur: NEGATIVE mg/dL
Specific Gravity, Urine: 1.003 — ABNORMAL LOW (ref 1.005–1.030)
pH: 7 (ref 5.0–8.0)

## 2019-06-10 LAB — APTT: aPTT: 29 seconds (ref 24–36)

## 2019-06-10 LAB — PROTIME-INR
INR: 1 (ref 0.8–1.2)
Prothrombin Time: 12.8 seconds (ref 11.4–15.2)

## 2019-06-10 NOTE — Progress Notes (Signed)
Ambul referral to [facility] placed for petechial rash.

## 2019-06-10 NOTE — Progress Notes (Unsigned)
Needs UA sometime next month to f/up on one event of myoglobinuria

## 2019-06-11 NOTE — Progress Notes (Signed)
Patient will scheduled when she comes in on 06/19/19

## 2019-06-19 ENCOUNTER — Ambulatory Visit (INDEPENDENT_AMBULATORY_CARE_PROVIDER_SITE_OTHER): Payer: Managed Care, Other (non HMO) | Admitting: Nurse Practitioner

## 2019-06-19 ENCOUNTER — Other Ambulatory Visit: Payer: Self-pay

## 2019-06-19 ENCOUNTER — Encounter: Payer: Self-pay | Admitting: Nurse Practitioner

## 2019-06-19 ENCOUNTER — Other Ambulatory Visit (HOSPITAL_COMMUNITY)
Admission: RE | Admit: 2019-06-19 | Discharge: 2019-06-19 | Disposition: A | Discharge status: Home / Self Care | Payer: Managed Care, Other (non HMO) | Source: Ambulatory Visit | Attending: Nurse Practitioner | Admitting: Nurse Practitioner

## 2019-06-19 VITALS — BP 142/100 | HR 94 | Temp 98.0°F | Ht 71.0 in | Wt 244.0 lb

## 2019-06-19 DIAGNOSIS — I10 Essential (primary) hypertension: Secondary | ICD-10-CM

## 2019-06-19 DIAGNOSIS — Z0000 Encounter for general adult medical examination without abnormal findings: Secondary | ICD-10-CM | POA: Insufficient documentation

## 2019-06-19 DIAGNOSIS — R233 Spontaneous ecchymoses: Secondary | ICD-10-CM

## 2019-06-19 DIAGNOSIS — Z7689 Persons encountering health services in other specified circumstances: Secondary | ICD-10-CM | POA: Insufficient documentation

## 2019-06-19 DIAGNOSIS — F325 Major depressive disorder, single episode, in full remission: Secondary | ICD-10-CM | POA: Diagnosis not present

## 2019-06-19 NOTE — Progress Notes (Signed)
Established Patient Office Visit  Subjective:  Patient ID: Samantha Glass, female    DOB: 1990-10-03  Age: 29 y.o. MRN: [id number]  CC:  Chief Complaint  Patient presents with  . Annual Exam    CPE with pap    HPI Samantha Glass is a 29 yo who presents for CPE.   Essential HTN: Her hypertension was diagnosed in  2014 and she presented on HCTZ 25 mg daily and amlodipine 10 mg daily.  She has done well on the medication.  She has noted no chest pain, lightheadedness, dizziness, ankle swelling and reports her blood pressure is well controlled at home.  She feels anxious today about getting her Pap test because she dislikes GYN exams after a bad experience in the past with IUD .   BP Readings from Last 3 Encounters:  06/19/19 (!) 142/100  06/09/19 118/82  04/14/19 112/80    Obesity BMI: 34.03. Noom still working on diet/exercise- lost 20 lbs and not re gaining. Home gym and is more active.   Wt Readings from Last 3 Encounters:  06/19/19 244 lb (110.7 kg)  06/09/19 244 lb 3.2 oz (110.8 kg)  04/14/19 245 lb 3.2 oz (111.2 kg)   Depression: Doing well on Wellbutrin XL 300 mg daily.  Diagnosed with depression as a sophomore in college. Today: PHQ-9: 4 and GAD-7: 2   Iron deficiency anemia: Hgb 13.4 She has been on iron supplements for a couple years.  Heavy menstrual cycles.  She is on Tri-Sprintec birth control and is thinking about coming off of birth control and using condoms. She would like to get pregnant in a year or so.   HA are better with glasses- gets eye strain HA at end of the day. Blue light protection.  Petechiae rash bilateral feet/ankles- DERM appt is not until Sept. No changes to the rash. Lab work-up with normal Pt-INR/PTT, CBC, CMet, UA- positive small Hgb- to recheck in July.   Immunizations:  Covid 03/20/19 and 04/15/19.Needs TDap. Recommend wait 3 mos after last Covid vaccine.  Diet:healthy Exercise: yes Pap Smear: today Mammogram: Average risk at this  time- to begin age 18 Dentist: UTD Vision: UTD Seatbelt: always Sunscreen: Yes  Past Medical History:  Diagnosis Date  . Depression   . 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 Hypertension   . IDA (iron deficiency anemia) 04/14/2019  . Obesity (BMI 30.0-34.9) 04/14/2019  . UTI (urinary tract infection)     Past Surgical History:  Procedure Laterality Date  . TONSILLECTOMY  age 36    Family History  Problem Relation Age of Onset  . Hypertension Mother   . Depression Mother   . Hypertension Maternal Grandmother   . Arthritis Maternal Grandmother   . Diabetes Paternal Grandmother   . Hypertension Paternal Grandmother   . Hyperlipidemia Paternal Grandmother   . Hypertension Paternal Grandfather     Social History   Socioeconomic History  . Marital status: Married    Spouse name: Not on file  . Number of children: Not on file  . Years of education: Not on file  . Highest education level: Not on file  Occupational History  . Not on file  Tobacco Use  . Smoking status: Never Smoker  . Smokeless tobacco: Never Used  Substance and Sexual Activity  . Alcohol use: Yes    Comment: occassioanlly  . Drug use: No  . Sexual activity: Yes  Other Topics Concern  . Not on file  Social History Narrative  . Not on file   Social Determinants of Health   Financial Resource Strain:   . Difficulty of Paying Living Expenses:   Food Insecurity:   . Worried About Programme researcher, broadcasting/film/video in the Last Year:   . Barista in the Last Year:   Transportation Needs:   . Freight forwarder (Medical):   Marland Kitchen Lack of Transportation (Non-Medical):   Physical Activity:   . Days of Exercise per Week:   . Minutes of Exercise per Session:   Stress:   . Feeling of Stress :   Social Connections:   . Frequency of Communication with Friends and Family:   . Frequency of Social Gatherings with Friends and Family:   . Attends Religious Services:   . Active  Member of Clubs or Organizations:   . Attends Banker Meetings:   Marland Kitchen Marital Status:   Intimate Partner Violence:   . Fear of Current or Ex-Partner:   . Emotionally Abused:   Marland Kitchen Physically Abused:   . Sexually Abused:     Outpatient Medications Prior to Visit  Medication Sig Dispense Refill  . amLODipine (NORVASC) 10 MG tablet Take 1 tablet (10 mg total) by mouth daily. 30 tablet 3  . buPROPion (WELLBUTRIN XL) 300 MG 24 hr tablet TAKE 1 TABLET BY MOUTH EVERY DAY 90 tablet 2  . ferrous sulfate 325 (65 FE) MG tablet Take 325 mg by mouth daily with breakfast.    . hydrochlorothiazide (HYDRODIURIL) 25 MG tablet Take 1 tablet (25 mg total) by mouth daily. 30 tablet 3  . loratadine (CLARITIN) 10 MG tablet Take 10 mg by mouth daily.    . Norgestimate-Ethinyl Estradiol Triphasic (TRI-SPRINTEC) 0.18/0.215/0.25 MG-35 MCG tablet Take 1 tablet by mouth daily.     No facility-administered medications prior to visit.    No Known Allergies  ROS Review of Systems  Constitutional: Negative for chills and fever.  HENT: Negative for congestion, sinus pressure and sore throat.   Respiratory: Negative for cough and shortness of breath.   Cardiovascular: Negative for chest pain and leg swelling.  Gastrointestinal: Negative for abdominal pain and blood in stool.  Endocrine: Negative.   Genitourinary: Negative for difficulty urinating, dysuria, frequency, hematuria, menstrual problem and pelvic pain.  Musculoskeletal: Negative for back pain.  Skin: Positive for rash.       petechaia rash unchanged   Allergic/Immunologic: Positive for environmental allergies.       Claritin started  Neurological: Negative.   Hematological: Negative.   Psychiatric/Behavioral:       No concerns about depression today and stable on the meds. She has reached out to Dr. Maryruth Bun.       Objective:    Physical Exam Vitals reviewed.  Constitutional:      Appearance: Normal appearance. She is obese.  HENT:       Head: Normocephalic and atraumatic.  Cardiovascular:     Rate and Rhythm: Normal rate and regular rhythm.     Pulses: Normal pulses.     Heart sounds: Normal heart sounds.  Pulmonary:     Effort: Pulmonary effort is normal.     Breath sounds: Normal breath sounds.  Abdominal:     General: Bowel sounds are normal.     Palpations: Abdomen is soft.     Tenderness: There is no abdominal tenderness.  Genitourinary:    General: Normal vulva.     Vagina: Normal.     Cervix: Normal.  Uterus: Normal.      Adnexa: Right adnexa normal and left adnexa normal.     Rectum: Normal.  Musculoskeletal:        General: Normal range of motion.     Cervical back: Normal range of motion and neck supple.  Skin:    General: Skin is warm and dry.  Neurological:     General: No focal deficit present.     Mental Status: She is alert and oriented to person, place, and time.  Psychiatric:        Behavior: Behavior normal.        Thought Content: Thought content normal.        Judgment: Judgment normal.     Comments: Mildly anxious at first, and then more relaxed after the pap test was over.      BP (!) 142/100 (BP Location: Left Arm, Patient Position: Sitting, Cuff Size: Normal)   Pulse 94   Temp 98 F (36.7 C) (Skin)   Ht 5\' 11"  (1.803 m)   Wt 244 lb (110.7 kg)   SpO2 97%   BMI 34.03 kg/m  Wt Readings from Last 3 Encounters:  06/19/19 244 lb (110.7 kg)  06/09/19 244 lb 3.2 oz (110.8 kg)  04/14/19 245 lb 3.2 oz (111.2 kg)    Health Maintenance Due  Topic Date Due  . Hepatitis C Screening  Never done  . HIV Screening  Never done  . PAP-Cervical Cytology Screening  Never done  . PAP SMEAR-Modifier  Never done  . TETANUS/TDAP  06/15/2018    There are no preventive care reminders to display for this patient.  Lab Results  Component Value Date   TSH 1.95 04/14/2019   Lab Results  Component Value Date   WBC 7.8 06/10/2019   HGB 13.4 06/10/2019   HCT 40.4 06/10/2019   MCV  91.4 06/10/2019   PLT 338 06/10/2019   Lab Results  Component Value Date   NA 137 06/10/2019   K 3.7 06/10/2019   CO2 28 06/10/2019   GLUCOSE 92 06/10/2019   BUN 6 06/10/2019   CREATININE 0.79 06/10/2019   BILITOT 0.6 06/10/2019   ALKPHOS 48 06/10/2019   AST 15 06/10/2019   ALT 17 06/10/2019   PROT 7.9 06/10/2019   ALBUMIN 4.1 06/10/2019   CALCIUM 9.3 06/10/2019   ANIONGAP 9 06/10/2019   GFR 109.06 04/14/2019   Lab Results  Component Value Date   CHOL 149 04/14/2019   Lab Results  Component Value Date   HDL 58.30 04/14/2019   Lab Results  Component Value Date   LDLCALC 71 04/14/2019   Lab Results  Component Value Date   TRIG 98.0 04/14/2019   Lab Results  Component Value Date   CHOLHDL 3 04/14/2019   Lab Results  Component Value Date   HGBA1C 5.1 04/14/2019      Assessment & Plan:   Problem List Items Addressed This Visit      Cardiovascular and Mediastinum   Essential hypertension     Other   Depression, major, single episode, complete remission ([place])   Routine physical examination - Primary   Relevant Orders   Cytology - PAP( [place])   Petechiae     Her blood pressure is always well controlled.  Today, she reports feeling stressed.  Consider changing her blood pressure medicine from amlodipine in case the vasodilation is playing a role in her intermittent headache and petechiae rash.  She has resting tachycardia, and may benefit from  a beta-blocker.  I will check with GYN regarding their preferred beta blocker anti hypertensive agents so that she does not have to switch when she becomes pregnant.   Petechia rash feet/ankles: No change in a month. Will try to get a sooner DERM appt. She needs a urine check next month to follow up on the myoglobinuria.   Anticipatory guidance: Patient counseled regarding regular dental exams q6 months, eye exams yearly. She would like to stop oral contraception and try to become pregnant next year. Advised to use  condoms so the pregnancy will be planned like she is desiring. She will need HCV, HIV screen  next blood draw.  She is due for a Tdap next month.   Risk factor reduction:  Advised patient of need for regular exercise and diet rich and fruits and vegetables  And she is already doing this. She is a non smoker.   No orders of the defined types were placed in this encounter.  Follow-up: Return in about 6 months (around 12/19/2019).  This visit occurred during the SARS-CoV-2 public health emergency.  Safety protocols were in place, including screening questions prior to the visit, additional usage of staff PPE, and extensive cleaning of exam room while observing appropriate contact time as indicated for disinfecting solutions.   Amedeo Kinsman, NP

## 2019-06-19 NOTE — Patient Instructions (Addendum)
Immunization Schedule, 29-29 Years Old  Vaccines are usually given at various ages, according to a schedule. Your health care provider will recommend vaccines for you based on your age, medical history, and lifestyle or other factors, such as travel or where you work. You may receive vaccines as individual doses or as more than one vaccine together in one shot (combination vaccines). Talk with your health care provider about the risks and benefits of combination vaccines. Recommended immunizations for 29-26 years old Influenza vaccine  You should get a dose of the influenza vaccine every year. Tetanus, diphtheria, and pertussis vaccine A vaccine that protects against tetanus, diphtheria, and pertussis is known as the Tdap vaccine. A vaccine that protects against tetanus and diphtheria is known as the Td vaccine.  You should only get the Td vaccine if you have had at least 1 dose of the Tdap vaccine.  You should get 1 dose of the Td or Tdap vaccine every 10 years, or you should get 1 dose of the Tdap vaccine if: ? You have not previously gotten a Tdap vaccine. ? You do not know if you have ever gotten a Tdap vaccine. ? You are between 27 and [redacted] weeks pregnant. Measles, mumps, and rubella vaccine This is also known as the MMR vaccine. You may need to get the MMR vaccine if:  You need to catch up on doses you missed in the past.  You have not been given the vaccine before.  You do not have evidence of immunity (by a blood test). Pregnant women should not get the MMR vaccine during pregnancy because it may be harmful to the unborn baby. However, if you are not immune to measles, mumps, or rubella, you should get a dose of MMR vaccine one month or more before pregnancy or within days after delivery. Varicella vaccine This is also known as the VAR vaccine. You may need to get the VAR vaccine if you were born in 1980 or later and:  You need to catch up on doses you missed in the past.  You  have not been given the vaccine before.  You do not have evidence of immunity (by a blood test).  You have certain high-risk conditions, such as HIV or AIDS. Pregnant women should not get the VAR vaccine during pregnancy because it may be harmful to the unborn baby. However, if you are not immune to chickenpox (varicella), you should get a dose of the VAR vaccine within days after delivery. Human papillomavirus vaccine This is also known as the HPV vaccine. If you have not gotten the vaccine before or you missed doses in the past, talk to your health care provider about whether it is appropriate for you to get the HPV vaccine. Pneumococcal conjugate vaccine This is also known as the PCV13 vaccine. You should get the PCV13 vaccine as recommended if you have certain high-risk conditions. These include:  Diabetes.  Chronic conditions of the heart, lungs, or liver.  Conditions that affect the body's disease-fighting system (immune system). Pneumococcal polysaccharide vaccine This is also known as the PPSV23 vaccine. You should get the PPSV23 vaccine as recommended if you have certain high-risk conditions. These include:  Diabetes.  Chronic conditions of the heart, lungs, or liver.  Conditions that affect the immune system. Hepatitis A vaccine This is also known as the HepA vaccine. If you did not get the HepA vaccine previously, you should get it if:  You are at risk for a hepatitis A infection. You  may be at risk for infection if you: ? Have chronic liver disease. ? Have HIV or AIDS. ? Are a man who has sex with men. ? Use drugs. ? Are homeless. ? May be exposed to hepatitis A through work. ? Travel to countries where hepatitis A is common. ? Are pregnant. ? Have or will have close contact with someone who was adopted from another country.  You are not at risk for infection but want protection from hepatitis A. Hepatitis B vaccine This is also known as the HepB vaccine. If you  did not get the HepB vaccine previously, you should get it if:  You are at risk for hepatitis B infection. You are at risk if you: ? Have chronic liver disease. ? Have HIV or AIDS. ? Have sex with a partner who has hepatitis B, or:  You have multiple sex partners.  You are a man who has sex with men. ? Use drugs. ? May be exposed to hepatitis B through work. ? Live with someone who has hepatitis B. ? Receive dialysis treatment. ? Have diabetes. ? Travel to countries where hepatitis B is common. ? Are pregnant.  You are not at risk of infection but want protection from hepatitis B. Meningococcal conjugate vaccine This is also known as the MenACWY vaccine. You may need to get the MenACWY vaccine if you:  Have not been given the vaccine before.  Need to catch up on doses you missed in the past. This vaccine is especially important if you:  Do not have a spleen.  Have sickle cell disease.  Have HIV.  Take medicines that suppress your immune system.  Travel to countries where meningococcal disease is common.  Are exposed to Neisseria meningitidis at work. Serogroup B meningococcal vaccine This is also known as the MenB vaccine. You may need to get the MenB vaccine if you:  Have not been given the vaccine before.  Need to catch up on doses you missed in the past. This vaccine is especially important if you:  Do not have a spleen.  Have sickle cell disease.  Take medicines that suppress your immune system.  Are exposed to Neisseria meningitidis at work. Haemophilus influenzae type b vaccine This is also known as the Hib vaccine. Anyone older than 29 years of age is usually not given the Hib vaccine. However, if you have certain high-risk conditions, you may need to get this vaccine. These conditions include:  Not having a spleen.  Having received a stem cell transplant. Before you get a vaccine: Talk with your health care provider about which vaccines are right for  you. This is especially important if:  You previously had a reaction after getting a vaccine.  You have a weakened immune system. You may have a weakened immune system if you: ? Are taking medicines that reduce (suppress) the activity of your immune system. ? Are taking medicines to treat cancer (chemotherapy). ? Have HIV or AIDS.  You work in an environment where you may be exposed to a disease.  You plan to travel outside of the country.  You have a chronic illness, such as heart disease, kidney disease, diabetes, or lung disease.  You are pregnant, think you may be pregnant, or are planning to become pregnant. Summary  Before you get a vaccine, tell your health care provider if you have reacted to vaccines in the past or have a condition that weakens your immune system.  At 27-49 years, you should get  a dose of the influenza vaccine every year and a dose of the Td or Tdap vaccine every 10 years.  Depending on your medical history and your risk factors, you may need other vaccines. Ask your health care provider whether you are up to date on all your vaccines.  Women who are pregnant may not receive certain vaccines. Ask your health care provider whether you should receive any vaccines soon after you deliver your baby. This information is not intended to replace advice given to you by your health care provider. Make sure you discuss any questions you have with your health care provider. Document Revised: 10/15/2018 Document Reviewed: 10/15/2018 Elsevier Patient Education  2020 [employer].  Preparing for Pregnancy If you are considering becoming pregnant, make an appointment to see your regular health care provider to learn how to prepare for a safe and healthy pregnancy (preconception care). During a preconception care visit, your health care provider will:  Do a complete physical exam, including a Pap test.  Take a complete medical history.  Give you information, answer your  questions, and help you resolve problems. Preconception checklist Medical history  Tell your health care provider about any current or past medical conditions. Your pregnancy or your ability to become pregnant may be affected by chronic conditions, such as diabetes, chronic hypertension, and thyroid problems.  Include your family's medical history as well as your partner's medical history.  Tell your health care provider about any history of STIs (sexually transmitted infections).These can affect your pregnancy. In some cases, they can be passed to your baby. Discuss any concerns that you have about STIs.  If indicated, discuss the benefits of genetic testing. This testing will show whether there are any genetic conditions that may be passed from you or your partner to your baby.  Tell your health care provider about: ? Any problems you have had with conception or pregnancy. ? Any medicines you take. These include vitamins, herbal supplements, and over-the-counter medicines. ? Your history of immunizations. Discuss any vaccinations that you may need. Diet  Ask your health care provider what to include in a healthy diet that has a balance of nutrients. This is especially important when you are pregnant or preparing to become pregnant.  Ask your health care provider to help you reach a healthy weight before pregnancy. ? If you are overweight, you may be at higher risk for certain complications, such as high blood pressure, diabetes, and preterm birth. ? If you are underweight, you are more likely to have a baby who has a low birth weight. Lifestyle, work, and home  Let your health care provider know: ? About any lifestyle habits that you have, such as alcohol use, drug use, or smoking. ? About recreational activities that may put you at risk during pregnancy, such as downhill skiing and certain exercise programs. ? Tell your health care provider about any international travel, especially  any travel to places with an active [place] virus outbreak. ? About harmful substances that you may be exposed to at work or at home. These include chemicals, pesticides, radiation, or even litter boxes. ? If you do not feel safe at home. Mental health  Tell your health care provider about: ? Any history of mental health conditions, including feelings of depression, sadness, or anxiety. ? Any medicines that you take for a mental health condition. These include herbs and supplements. Home instructions to prepare for pregnancy Lifestyle   Eat a balanced diet. This includes fresh fruits  and vegetables, whole grains, lean meats, low-fat dairy products, healthy fats, and foods that are high in fiber. Ask to meet with a nutritionist or registered dietitian for assistance with meal planning and goals.  Get regular exercise. Try to be active for at least 30 minutes a day on most days of the week. Ask your health care provider which activities are safe during pregnancy.  Do not use any products that contain nicotine or tobacco, such as cigarettes and e-cigarettes. If you need help quitting, ask your health care provider.  Do not drink alcohol.  Do not take illegal drugs.  Maintain a healthy weight. Ask your health care provider what weight range is right for you. General instructions  Keep an accurate record of your menstrual periods. This makes it easier for your health care provider to determine your baby's due date.  Begin taking prenatal vitamins and folic acid supplements daily as directed by your health care provider.  Manage any chronic conditions, such as high blood pressure and diabetes, as told by your health care provider. This is important. How do I know that I am pregnant? You may be pregnant if you have been sexually active and you miss your period. Symptoms of early pregnancy include:  Mild cramping.  Very light vaginal bleeding (spotting).  Feeling unusually tired.  Nausea  and vomiting (morning sickness). If you have any of these symptoms and you suspect that you might be pregnant, you can take a home pregnancy test. These tests check for a hormone in your urine (human chorionic gonadotropin, or hCG). A woman's body begins to make this hormone during early pregnancy. These tests are very accurate. Wait until at least the first day after you miss your period to take one. If the test shows that you are pregnant (you get a positive result), call your health care provider to make an appointment for prenatal care. What should I do if I become pregnant?      Make an appointment with your health care provider as soon as you suspect you are pregnant.  Do not use any products that contain nicotine, such as cigarettes, chewing tobacco, and e-cigarettes. If you need help quitting, ask your health care provider.  Do not drink alcoholic beverages. Alcohol is related to a number of birth defects.  Avoid toxic odors and chemicals.  You may continue to have sexual intercourse if it does not cause pain or other problems, such as vaginal bleeding. This information is not intended to replace advice given to you by your health care provider. Make sure you discuss any questions you have with your health care provider. Document Revised: 12/20/2016 Document Reviewed: 07/10/2015 Elsevier Patient Education  2020 [employer] 55-12 Years Old, Female Preventive care refers to visits with your health care provider and lifestyle choices that can promote health and wellness. This includes:  A yearly physical exam. This may also be called an annual well check.  Regular dental visits and eye exams.  Immunizations.  Screening for certain conditions.  Healthy lifestyle choices, such as eating a healthy diet, getting regular exercise, not using drugs or products that contain nicotine and tobacco, and limiting alcohol use. What can I expect for my preventive care  visit? Physical exam Your health care provider will check your:  Height and weight. This may be used to calculate body mass index (BMI), which tells if you are at a healthy weight.  Heart rate and blood pressure.  Skin for abnormal spots.  Counseling Your health care provider may ask you questions about your:  Alcohol, tobacco, and drug use.  Emotional well-being.  Home and relationship well-being.  Sexual activity.  Eating habits.  Work and work Astronomer.  Method of birth control.  Menstrual cycle.  Pregnancy history. What immunizations do I need?  Influenza (flu) vaccine  This is recommended every year. Tetanus, diphtheria, and pertussis (Tdap) vaccine  You may need a Td booster every 10 years. Varicella (chickenpox) vaccine  You may need this if you have not been vaccinated. Human papillomavirus (HPV) vaccine  If recommended by your health care provider, you may need three doses over 6 months. Measles, mumps, and rubella (MMR) vaccine  You may need at least one dose of MMR. You may also need a second dose. Meningococcal conjugate (MenACWY) vaccine  One dose is recommended if you are age 57-21 years and a first-year college student living in a residence hall, or if you have one of several medical conditions. You may also need additional booster doses. Pneumococcal conjugate (PCV13) vaccine  You may need this if you have certain conditions and were not previously vaccinated. Pneumococcal polysaccharide (PPSV23) vaccine  You may need one or two doses if you smoke cigarettes or if you have certain conditions. Hepatitis A vaccine  You may need this if you have certain conditions or if you travel or work in places where you may be exposed to hepatitis A. Hepatitis B vaccine  You may need this if you have certain conditions or if you travel or work in places where you may be exposed to hepatitis B. Haemophilus influenzae type b (Hib) vaccine  You may need  this if you have certain conditions. You may receive vaccines as individual doses or as more than one vaccine together in one shot (combination vaccines). Talk with your health care provider about the risks and benefits of combination vaccines. What tests do I need?  Blood tests  Lipid and cholesterol levels. These may be checked every 5 years starting at age 45.  Hepatitis C test.  Hepatitis B test. Screening  Diabetes screening. This is done by checking your blood sugar (glucose) after you have not eaten for a while (fasting).  Sexually transmitted disease (STD) testing.  BRCA-related cancer screening. This may be done if you have a family history of breast, ovarian, tubal, or peritoneal cancers.  Pelvic exam and Pap test. This may be done every 3 years starting at age 69. Starting at age 76, this may be done every 5 years if you have a Pap test in combination with an HPV test. Talk with your health care provider about your test results, treatment options, and if necessary, the need for more tests. Follow these instructions at home: Eating and drinking   Eat a diet that includes fresh fruits and vegetables, whole grains, lean protein, and low-fat dairy.  Take vitamin and mineral supplements as recommended by your health care provider.  Do not drink alcohol if: ? Your health care provider tells you not to drink. ? You are pregnant, may be pregnant, or are planning to become pregnant.  If you drink alcohol: ? Limit how much you have to 0-1 drink a day. ? Be aware of how much alcohol is in your drink. In the [place] one drink equals one 12 oz bottle of beer (355 mL), one 5 oz glass of wine (148 mL), or one 1 oz glass of hard liquor (44 mL). Lifestyle  Take daily care of your  teeth and gums.  Stay active. Exercise for at least 30 minutes on 5 or more days each week.  Do not use any products that contain nicotine or tobacco, such as cigarettes, e-cigarettes, and chewing tobacco.  If you need help quitting, ask your health care provider.  If you are sexually active, practice safe sex. Use a condom or other form of birth control (contraception) in order to prevent pregnancy and STIs (sexually transmitted infections). If you plan to become pregnant, see your health care provider for a preconception visit. What's next?  Visit your health care provider once a year for a well check visit.  Ask your health care provider how often you should have your eyes and teeth checked.  Stay up to date on all vaccines. This information is not intended to replace advice given to you by your health care provider. Make sure you discuss any questions you have with your health care provider. Document Revised: 08/29/2017 Document Reviewed: 08/29/2017 Elsevier Patient Education  2020 [employer].

## 2019-06-21 ENCOUNTER — Telehealth: Payer: Self-pay | Admitting: Nurse Practitioner

## 2019-06-21 DIAGNOSIS — R821 Myoglobinuria: Secondary | ICD-10-CM

## 2019-06-21 DIAGNOSIS — R233 Spontaneous ecchymoses: Secondary | ICD-10-CM | POA: Insufficient documentation

## 2019-06-21 NOTE — Telephone Encounter (Signed)
1. Please arrange a lab appt for UA to recheck for myoglobinuria.   2. It looks like her last Td was 06/14/2008. If that is correct, she is due for a Tdap and can get that in July (3 mos after her last Covid vaccine).   She can get the Tdap and the UA on the same day here in the clinic.  I'm not sure how to order a future Tdap for the clinic-may have to be placed when she comes.   3. Can you place a call into [facility] and see if her appt can be with someone other than Dr. Gwen Pounds to get it moved up sooner than Sept for petechiae rash feet/ankles?  Thank you.

## 2019-06-22 NOTE — Telephone Encounter (Signed)
Left message for patient to call back to scheduled lab appt and nurse visit appt for vaccine. Tried to call [facility] twice to see if appt can be moved but no one has answered the phone; will try again later

## 2019-06-22 NOTE — Telephone Encounter (Signed)
Patient returned office phone call. Patient stated she saw Arvilla Market on 06/19/2019. Arvilla Market told her to wait 3 months after getting the Covid vaccine before getting Tdap, patient will call office back to reschedule. Patient just had a urine lab On 06/10/2019, and Arvilla Market did not say anything about having another one. Patient is aware that office is trying to call [facility] for her.

## 2019-06-23 NOTE — Telephone Encounter (Signed)
LMTCB

## 2019-06-23 NOTE — Telephone Encounter (Signed)
When we did her  petechiae work-up, we had a urinalysis done.  It showed a little bit of blood in the urine, and we need to repeat the urinalysis to make sure this is resolved.  We do not want to do the UA anywhere close to menstrual cycle.  I forgot to talk to her about this at our last office visit.  Instead of waiting until  September to see [facility], perhaps Samantha Glass would like to try calling different dermatology offices in the area and see if she can get a sooner app.

## 2019-06-23 NOTE — Telephone Encounter (Signed)
I finally was able to speak to someone at [facility] and no one can see patient before sept. States they are down a doctor and all scheduled are completely full. Also patient is unsure about why she needs another UA done?

## 2019-06-24 NOTE — Telephone Encounter (Signed)
Spoke with patient and explained why another UA is needed; patient agreed and scheduled appt for 06/26/19 at 3:45pm. Patient will call other Derm practices to see if she can get in somewhere else. I advised patient to let us know if she needs a referral to see them.

## 2019-06-24 NOTE — Telephone Encounter (Signed)
LMTCB

## 2019-06-25 LAB — CYTOLOGY - PAP
Comment: NEGATIVE
Diagnosis: NEGATIVE
High risk HPV: NEGATIVE

## 2019-06-26 ENCOUNTER — Other Ambulatory Visit: Payer: Self-pay

## 2019-06-26 ENCOUNTER — Other Ambulatory Visit (INDEPENDENT_AMBULATORY_CARE_PROVIDER_SITE_OTHER): Payer: Managed Care, Other (non HMO)

## 2019-06-26 ENCOUNTER — Other Ambulatory Visit: Payer: Managed Care, Other (non HMO)

## 2019-06-26 DIAGNOSIS — R821 Myoglobinuria: Secondary | ICD-10-CM | POA: Diagnosis not present

## 2019-06-27 ENCOUNTER — Encounter: Payer: Self-pay | Admitting: Nurse Practitioner

## 2019-06-27 LAB — URINALYSIS, ROUTINE W REFLEX MICROSCOPIC
Bilirubin, UA: NEGATIVE
Glucose, UA: NEGATIVE
Ketones, UA: NEGATIVE
Nitrite, UA: NEGATIVE
Protein,UA: NEGATIVE
RBC, UA: NEGATIVE
Specific Gravity, UA: 1.007 (ref 1.005–1.030)
Urobilinogen, Ur: 0.2 mg/dL (ref 0.2–1.0)
pH, UA: 7.5 (ref 5.0–7.5)

## 2019-06-27 LAB — MICROSCOPIC EXAMINATION: Casts: NONE SEEN /lpf

## 2019-06-29 ENCOUNTER — Telehealth: Payer: Self-pay | Admitting: Nurse Practitioner

## 2019-06-29 ENCOUNTER — Other Ambulatory Visit: Payer: Self-pay

## 2019-06-29 ENCOUNTER — Other Ambulatory Visit (INDEPENDENT_AMBULATORY_CARE_PROVIDER_SITE_OTHER): Payer: Managed Care, Other (non HMO)

## 2019-06-29 DIAGNOSIS — R300 Dysuria: Secondary | ICD-10-CM

## 2019-06-29 LAB — URINALYSIS, ROUTINE W REFLEX MICROSCOPIC
Bilirubin Urine: NEGATIVE
Ketones, ur: NEGATIVE
Leukocytes,Ua: NEGATIVE
Nitrite: NEGATIVE
Specific Gravity, Urine: 1.01 (ref 1.000–1.030)
Total Protein, Urine: NEGATIVE
Urine Glucose: NEGATIVE
Urobilinogen, UA: 0.2 (ref 0.0–1.0)
WBC, UA: NONE SEEN (ref 0–?)
pH: 7 (ref 5.0–8.0)

## 2019-06-29 NOTE — Telephone Encounter (Signed)
Pt wants a call before visit on Wednesday about her lab results

## 2019-06-29 NOTE — Telephone Encounter (Signed)
Patient is scheduled to come in today for UA and culture

## 2019-06-29 NOTE — Telephone Encounter (Signed)
Ask her to come in today to give a  UA and urine culture.

## 2019-06-30 LAB — URINE CULTURE
MICRO NUMBER:: 10641931
SPECIMEN QUALITY:: ADEQUATE

## 2019-07-01 ENCOUNTER — Telehealth: Payer: Self-pay | Admitting: Nurse Practitioner

## 2019-07-01 ENCOUNTER — Other Ambulatory Visit: Payer: Self-pay

## 2019-07-01 ENCOUNTER — Ambulatory Visit: Payer: Managed Care, Other (non HMO) | Admitting: Nurse Practitioner

## 2019-07-01 ENCOUNTER — Encounter: Payer: Self-pay | Admitting: Nurse Practitioner

## 2019-07-01 VITALS — BP 122/84 | HR 94 | Temp 97.9°F | Ht 71.0 in | Wt 247.0 lb

## 2019-07-01 DIAGNOSIS — R1032 Left lower quadrant pain: Secondary | ICD-10-CM | POA: Diagnosis not present

## 2019-07-01 DIAGNOSIS — R7982 Elevated C-reactive protein (CRP): Secondary | ICD-10-CM | POA: Diagnosis not present

## 2019-07-01 DIAGNOSIS — R233 Spontaneous ecchymoses: Secondary | ICD-10-CM | POA: Diagnosis not present

## 2019-07-01 DIAGNOSIS — R821 Myoglobinuria: Secondary | ICD-10-CM

## 2019-07-01 LAB — POCT URINE PREGNANCY: Preg Test, Ur: NEGATIVE

## 2019-07-01 NOTE — Patient Instructions (Addendum)
To the lab today.   Will call with results and make further recommendations.  For now, cut out popcorn, nuts/seeds/raw garden veggies, Kombucha for 1-2 weeks and see if it helps. In case this is irritable bowel.  Referral to Urology for microscopic red blood cells in urine.      Abdominal Pain, Adult Pain in the abdomen (abdominal pain) can be caused by many things. Often, abdominal pain is not serious and it gets better with no treatment or by being treated at home. However, sometimes abdominal pain is serious. Your health care provider will ask questions about your medical history and do a physical exam to try to determine the cause of your abdominal pain. Follow these instructions at home:  Medicines  Take over-the-counter and prescription medicines only as told by your health care provider.  Do not take a laxative unless told by your health care provider. General instructions  Watch your condition for any changes.  Drink enough fluid to keep your urine pale yellow.  Keep all follow-up visits as told by your health care provider. This is important. Contact a health care provider if:  Your abdominal pain changes or gets worse.  You are not hungry or you lose weight without trying.  You are constipated or have diarrhea for more than 2-3 days.  You have pain when you urinate or have a bowel movement.  Your abdominal pain wakes you up at night.  Your pain gets worse with meals, after eating, or with certain foods.  You are vomiting and cannot keep anything down.  You have a fever.  You have blood in your urine. Get help right away if:  Your pain does not go away as soon as your health care provider told you to expect.  You cannot stop vomiting.  Your pain is only in areas of the abdomen, such as the right side or the left lower portion of the abdomen. Pain on the right side could be caused by appendicitis.  You have bloody or black stools, or stools that look  like tar.  You have severe pain, cramping, or bloating in your abdomen.  You have signs of dehydration, such as: ? Dark urine, very little urine, or no urine. ? Cracked lips. ? Dry mouth. ? Sunken eyes. ? Sleepiness. ? Weakness.  You have trouble breathing or chest pain. Summary  Often, abdominal pain is not serious and it gets better with no treatment or by being treated at home. However, sometimes abdominal pain is serious.  Watch your condition for any changes.  Take over-the-counter and prescription medicines only as told by your health care provider.  Contact a health care provider if your abdominal pain changes or gets worse.  Get help right away if you have severe pain, cramping, or bloating in your abdomen. This information is not intended to replace advice given to you by your health care provider. Make sure you discuss any questions you have with your health care provider. Document Revised: 04/28/2018 Document Reviewed: 04/28/2018 Elsevier Patient Education  2020 [employer].

## 2019-07-01 NOTE — Telephone Encounter (Signed)
Dr. Richardo Hanks reviewed the referral and determined that we did not need to see this patient. Please be advised that no referral needed for dipstick positive blood, needs to be 3 or greater RBCs on micro to be considered microscopic hematuria, thanks  msg from [facility] urology

## 2019-07-01 NOTE — Progress Notes (Signed)
Established Patient Office Visit  Subjective:  Patient ID: Samantha Glass, female    DOB: Aug 18, 1990  Age: 29 y.o. MRN: [id number]  CC:  Chief Complaint  Patient presents with  . Acute Visit    lower left abdominal pain   HPI Samantha Glass is a healthy 29 year old who reports new onset intermittent fleeting left lower quadrant discomfort that started one week ago.  She describes this as a dull ache or pressure rated at 3.5 out of 10 scale that lasts a few minutes and resolves spontaneously.  She is pain free in between random spells. She has found no triggers.  She does note that if she sits up straight it seems to be worse than when she is slouched over.  She will get a random sharp pain that lasts a second and then resolves.  She has normal bowel habits: formed once a day without blood or melena.  She eats a diet heavy in greens, popcorn, and has never had a problem with irritable bowel, diarrhea, diverticulitis, and has noted no vaginal complaints.  She experiences discomfort yesterday at 3 PM while working at her desk and lasted a few minutes and resolved.  She was pain-free until after supper when she was cleaning the dishes and felt a twinge again that lasted less than 5 minutes and resolved.  She presents today pain-free.  She has noted no urgency, frequency burning.  Recent urinalysis still showed trace blood, culture was negative.  She is at the end of her birth control packet and is going to stop them after this month. She would like to get pregnant next year. She has had no vaginal complaints her pelvic exam was normal on 06/19/2019. LMP- due this week. Periods on the pill and regular.In the past several months -added probiotic- Kefir, Kombucha, - no UGI upset. She is waiting on a DERM appt for fine petechiae bilat ankles and feet. Rash unchanged from April with normal  plts, Hgb-13.4, PT/INR, PTT, iron sat is a little low 19.6 (20-50%), TIBC 382 Serum iron 105.    Past Medical  History:  Diagnosis Date  . Depression   . Depression, major, single episode, complete remission ([place]) 04/14/2019  . Hypertension   . IDA (iron deficiency anemia) 04/14/2019  . Obesity (BMI 30.0-34.9) 04/14/2019  . UTI (urinary tract infection)     Past Surgical History:  Procedure Laterality Date  . TONSILLECTOMY  age 28    Family History  Problem Relation Age of Onset  . Hypertension Mother   . Depression Mother   . Hypertension Maternal Grandmother   . Arthritis Maternal Grandmother   . Diabetes Paternal Grandmother   . Hypertension Paternal Grandmother   . Hyperlipidemia Paternal Grandmother   . Hypertension Paternal Grandfather     Social History   Socioeconomic History  . Marital status: Married    Spouse name: Not on file  . Number of children: Not on file  . Years of education: Not on file  . Highest education level: Not on file  Occupational History  . Not on file  Tobacco Use  . Smoking status: Never Smoker  . Smokeless tobacco: Never Used  Substance and Sexual Activity  . Alcohol use: Yes    Comment: occassioanlly  . Drug use: No  . Sexual activity: Yes  Other Topics Concern  . Not on file  Social History Narrative  . Not on file   Social Determinants of Health   Financial Resource Strain:   .  Difficulty of Paying Living Expenses:   Food Insecurity:   . Worried About Charity fundraiser in the Last Year:   . Arboriculturist in the Last Year:   Transportation Needs:   . Film/video editor (Medical):   Marland Kitchen Lack of Transportation (Non-Medical):   Physical Activity:   . Days of Exercise per Week:   . Minutes of Exercise per Session:   Stress:   . Feeling of Stress :   Social Connections:   . Frequency of Communication with Friends and Family:   . Frequency of Social Gatherings with Friends and Family:   . Attends Religious Services:   . Active Member of Clubs or Organizations:   . Attends Archivist Meetings:   Marland Kitchen Marital Status:     Intimate Partner Violence:   . Fear of Current or Ex-Partner:   . Emotionally Abused:   Marland Kitchen Physically Abused:   . Sexually Abused:     Outpatient Medications Prior to Visit  Medication Sig Dispense Refill  . amLODipine (NORVASC) 10 MG tablet Take 1 tablet (10 mg total) by mouth daily. 30 tablet 3  . buPROPion (WELLBUTRIN XL) 300 MG 24 hr tablet TAKE 1 TABLET BY MOUTH EVERY DAY 90 tablet 2  . ferrous sulfate 325 (65 FE) MG tablet Take 325 mg by mouth daily with breakfast.    . hydrochlorothiazide (HYDRODIURIL) 25 MG tablet Take 1 tablet (25 mg total) by mouth daily. 30 tablet 3  . loratadine (CLARITIN) 10 MG tablet Take 10 mg by mouth daily.    . Norgestimate-Ethinyl Estradiol Triphasic (TRI-SPRINTEC) 0.18/0.215/0.25 MG-35 MCG tablet Take 1 tablet by mouth daily.     No facility-administered medications prior to visit.    No Known Allergies  ROS Review of Systems  Constitutional: Negative for chills and fever.  HENT: Negative.   Eyes: Negative.   Respiratory: Negative.   Cardiovascular: Negative.   Gastrointestinal: Positive for abdominal pain. Negative for abdominal distention, blood in stool, constipation, diarrhea, nausea and vomiting.  Skin: Negative.   Allergic/Immunologic: Negative.   Neurological: Negative.   Hematological: Does not bruise/bleed easily.      Objective:    Physical Exam Vitals reviewed.  Constitutional:      Appearance: Normal appearance.  Cardiovascular:     Rate and Rhythm: Normal rate and regular rhythm.     Pulses: Normal pulses.     Heart sounds: Normal heart sounds.  Pulmonary:     Effort: Pulmonary effort is normal.     Breath sounds: Normal breath sounds.  Abdominal:     General: There is no distension.     Palpations: Abdomen is soft. There is no mass.     Tenderness: There is no abdominal tenderness. There is no right CVA tenderness, left CVA tenderness, guarding or rebound.     Hernia: No hernia is present.  Musculoskeletal:         General: Normal range of motion.     Cervical back: Normal range of motion.  Skin:    General: Skin is warm and dry.     Findings: Rash present.     Comments: Same ankle/feet faint tiny petechiae rash as noted in April.   Neurological:     General: No focal deficit present.     Mental Status: She is alert and oriented to person, place, and time.  Psychiatric:        Mood and Affect: Mood normal.  Behavior: Behavior normal.     BP 122/84 (BP Location: Left Arm, Patient Position: Sitting, Cuff Size: Normal)   Pulse 94   Temp 97.9 F (36.6 C) (Skin)   Ht _0  (1.803 m)   Wt 247 lb (112 kg)   SpO2 97%   BMI 34.45 kg/m  Wt Readings from Last 3 Encounters:  07/01/19 247 lb (112 kg)  06/19/19 244 lb (110.7 kg)  06/09/19 244 lb 3.2 oz (110.8 kg)     Health Maintenance Due  Topic Date Due  . Hepatitis C Screening  Never done  . HIV Screening  Never done  . TETANUS/TDAP  06/15/2018    There are no preventive care reminders to display for this patient.  Lab Results  Component Value Date   TSH 1.95 04/14/2019   Lab Results  Component Value Date   WBC 8.2 07/01/2019   HGB 13.9 07/01/2019   HCT 41.1 07/01/2019   MCV 91.1 07/01/2019   PLT 378 07/01/2019   Lab Results  Component Value Date   NA 137 06/10/2019   K 3.7 06/10/2019   CO2 28 06/10/2019   GLUCOSE 92 06/10/2019   BUN 6 06/10/2019   CREATININE 0.79 06/10/2019   BILITOT 0.6 06/10/2019   ALKPHOS 48 06/10/2019   AST 15 06/10/2019   ALT 17 06/10/2019   PROT 7.9 06/10/2019   ALBUMIN 4.1 06/10/2019   CALCIUM 9.3 06/10/2019   ANIONGAP 9 06/10/2019   GFR 109.06 04/14/2019   Lab Results  Component Value Date   CHOL 149 04/14/2019   Lab Results  Component Value Date   HDL 58.30 04/14/2019   Lab Results  Component Value Date   LDLCALC 71 04/14/2019   Lab Results  Component Value Date   TRIG 98.0 04/14/2019   Lab Results  Component Value Date   CHOLHDL 3 04/14/2019   Lab Results   Component Value Date   HGBA1C 5.1 04/14/2019      Assessment & Plan:   Problem List Items Addressed This Visit      Other   Petechiae   Relevant Orders   CT Abdomen Pelvis W Contrast    Other Visit Diagnoses    Colicky LLQ abdominal pain    -  Primary   Relevant Orders   CBC with Differential/Platelet (Completed)   C-reactive protein (Completed)   Pregnancy, urine   Sedimentation rate (Completed)   POCT urine pregnancy (Completed)   CT Abdomen Pelvis W Contrast   Myoglobinuria       Relevant Orders   Ambulatory referral to Urology   Elevated C-reactive protein (CRP)       Relevant Orders   CT Abdomen Pelvis W Contrast     Will call with results and make further recommendations.  For now, cut out popcorn, nuts/seeds/raw garden veggies, Kombucha for 1-2 weeks and see if it helps. In case this is irritable bowel.  Referral to Urology for microscopic red blood cells in urine.   Addendum:  Order placed for consult with Urology declined by Dr. Diamantina Providence as he reviewed her labs and notes that she does not have enough blood in her urine to warrant Urology evaluation.  He reports that he would see her if she had microscopic RBCs at greater than 3/hpf to be called microscopic hematuria.  Labs: elevated CRP- normal ESR PLAN:  CT abd pelvis for LLQ intermittent pain Petechiae:  will try to move up the DERM appt.    No orders of the defined  types were placed in this encounter.  Follow-up: Return in about 2 weeks (around 07/15/2019), or if symptoms worsen or fail to improve.   This visit occurred during the SARS-CoV-2 public health emergency.  Safety protocols were in place, including screening questions prior to the visit, additional usage of staff PPE, and extensive cleaning of exam room while observing appropriate contact time as indicated for disinfecting solutions.   Denice Paradise, NP

## 2019-07-02 LAB — CBC WITH DIFFERENTIAL/PLATELET
Absolute Monocytes: 492 cells/uL (ref 200–950)
Basophils Absolute: 33 cells/uL (ref 0–200)
Basophils Relative: 0.4 %
Eosinophils Absolute: 230 cells/uL (ref 15–500)
Eosinophils Relative: 2.8 %
HCT: 41.1 % (ref 35.0–45.0)
Hemoglobin: 13.9 g/dL (ref 11.7–15.5)
Lymphs Abs: 2583 cells/uL (ref 850–3900)
MCH: 30.8 pg (ref 27.0–33.0)
MCHC: 33.8 g/dL (ref 32.0–36.0)
MCV: 91.1 fL (ref 80.0–100.0)
MPV: 9.9 fL (ref 7.5–12.5)
Monocytes Relative: 6 %
Neutro Abs: 4863 cells/uL (ref 1500–7800)
Neutrophils Relative %: 59.3 %
Platelets: 378 10*3/uL (ref 140–400)
RBC: 4.51 10*6/uL (ref 3.80–5.10)
RDW: 11.8 % (ref 11.0–15.0)
Total Lymphocyte: 31.5 %
WBC: 8.2 10*3/uL (ref 3.8–10.8)

## 2019-07-02 LAB — C-REACTIVE PROTEIN: CRP: 16.4 mg/L — ABNORMAL HIGH (ref <8.0)

## 2019-07-02 LAB — SEDIMENTATION RATE: Sed Rate: 17 mm/h (ref 0–20)

## 2019-07-07 ENCOUNTER — Other Ambulatory Visit: Payer: Self-pay | Admitting: Nurse Practitioner

## 2019-07-13 ENCOUNTER — Other Ambulatory Visit: Payer: Self-pay

## 2019-07-13 ENCOUNTER — Other Ambulatory Visit
Admission: RE | Admit: 2019-07-13 | Discharge: 2019-07-13 | Disposition: A | Discharge status: Home / Self Care | Payer: Managed Care, Other (non HMO) | Source: Home / Self Care | Attending: Nurse Practitioner | Admitting: Nurse Practitioner

## 2019-07-13 ENCOUNTER — Ambulatory Visit
Admission: RE | Admit: 2019-07-13 | Discharge: 2019-07-13 | Disposition: A | Discharge status: Home / Self Care | Payer: Managed Care, Other (non HMO) | Source: Ambulatory Visit | Attending: Nurse Practitioner | Admitting: Nurse Practitioner

## 2019-07-13 ENCOUNTER — Other Ambulatory Visit: Payer: Self-pay | Admitting: Nurse Practitioner

## 2019-07-13 DIAGNOSIS — R1032 Left lower quadrant pain: Secondary | ICD-10-CM | POA: Insufficient documentation

## 2019-07-13 DIAGNOSIS — R233 Spontaneous ecchymoses: Secondary | ICD-10-CM

## 2019-07-13 DIAGNOSIS — I10 Essential (primary) hypertension: Secondary | ICD-10-CM

## 2019-07-13 DIAGNOSIS — R7982 Elevated C-reactive protein (CRP): Secondary | ICD-10-CM | POA: Insufficient documentation

## 2019-07-13 LAB — BASIC METABOLIC PANEL
Anion gap: 9 (ref 5–15)
BUN: 7 mg/dL (ref 6–20)
CO2: 26 mmol/L (ref 22–32)
Calcium: 9.3 mg/dL (ref 8.9–10.3)
Chloride: 103 mmol/L (ref 98–111)
Creatinine, Ser: 0.78 mg/dL (ref 0.44–1.00)
GFR calc Af Amer: >60 mL/min (ref >60)
GFR calc non Af Amer: >60 mL/min (ref >60)
Glucose, Bld: 97 mg/dL (ref 70–99)
Potassium: 3.9 mmol/L (ref 3.5–5.1)
Sodium: 138 mmol/L (ref 135–145)

## 2019-07-13 MED ORDER — IOHEXOL 300 MG/ML  SOLN
100.0000 mL | Freq: Once | INTRAMUSCULAR | Status: AC | PRN
  Administered 2019-07-13: 100 mL via INTRAVENOUS

## 2019-07-13 NOTE — Addendum Note (Signed)
Addended by: Amedeo Kinsman A on: 07/13/2019 05:47 PM   Modules accepted: Orders

## 2019-07-14 ENCOUNTER — Telehealth: Payer: Self-pay | Admitting: Nurse Practitioner

## 2019-07-14 ENCOUNTER — Encounter: Payer: Self-pay | Admitting: Nurse Practitioner

## 2019-07-14 NOTE — Telephone Encounter (Signed)
Patient will take the appt with [facility]; gave patient the phone number to call them to schedule. Do you need to send a new referral as well?

## 2019-07-14 NOTE — Telephone Encounter (Signed)
All taken care of by Rasheedah

## 2019-07-14 NOTE — Telephone Encounter (Signed)
Can get her into [facility] in Aug 18- would she take that instead of DERM in Sept?

## 2019-07-16 ENCOUNTER — Ambulatory Visit: Payer: Managed Care, Other (non HMO) | Admitting: Nurse Practitioner

## 2019-07-17 ENCOUNTER — Ambulatory Visit: Payer: Managed Care, Other (non HMO) | Admitting: Nurse Practitioner

## 2019-07-20 ENCOUNTER — Ambulatory Visit: Payer: Managed Care, Other (non HMO) | Admitting: Nurse Practitioner

## 2019-07-21 ENCOUNTER — Ambulatory Visit: Payer: Managed Care, Other (non HMO)

## 2019-09-06 ENCOUNTER — Encounter: Payer: Self-pay | Admitting: Nurse Practitioner

## 2019-09-08 ENCOUNTER — Other Ambulatory Visit: Payer: Self-pay

## 2019-09-08 MED ORDER — AMLODIPINE BESYLATE 10 MG PO TABS: 10.0000 mg | ORAL_TABLET | Freq: Every day | ORAL | 3 refills | Status: DC

## 2019-09-21 ENCOUNTER — Ambulatory Visit: Payer: Managed Care, Other (non HMO) | Admitting: Dermatology

## 2019-10-05 ENCOUNTER — Telehealth: Payer: Self-pay | Admitting: Nurse Practitioner

## 2019-10-05 NOTE — Telephone Encounter (Signed)
Rejection Reason - Patient Declined" [facility] said on Oct 05, 2019 11:44 AM

## 2019-10-16 ENCOUNTER — Other Ambulatory Visit: Payer: Self-pay

## 2019-10-16 ENCOUNTER — Encounter: Payer: Self-pay | Admitting: Emergency Medicine

## 2019-10-16 ENCOUNTER — Encounter: Payer: Self-pay | Admitting: Nurse Practitioner

## 2019-10-16 ENCOUNTER — Telehealth: Payer: Self-pay | Admitting: Obstetrics and Gynecology

## 2019-10-16 ENCOUNTER — Ambulatory Visit: Payer: Managed Care, Other (non HMO) | Admitting: Nurse Practitioner

## 2019-10-16 VITALS — BP 112/84 | HR 94 | Temp 98.5°F | Ht 71.0 in | Wt 258.0 lb

## 2019-10-16 DIAGNOSIS — Z6835 Body mass index (BMI) 35.0-35.9, adult: Secondary | ICD-10-CM | POA: Diagnosis not present

## 2019-10-16 DIAGNOSIS — E611 Iron deficiency: Secondary | ICD-10-CM | POA: Diagnosis not present

## 2019-10-16 DIAGNOSIS — K8050 Calculus of bile duct without cholangitis or cholecystitis without obstruction: Secondary | ICD-10-CM | POA: Diagnosis not present

## 2019-10-16 DIAGNOSIS — F325 Major depressive disorder, single episode, in full remission: Secondary | ICD-10-CM | POA: Diagnosis not present

## 2019-10-16 DIAGNOSIS — R7982 Elevated C-reactive protein (CRP): Secondary | ICD-10-CM | POA: Insufficient documentation

## 2019-10-16 DIAGNOSIS — Z79899 Other long term (current) drug therapy: Secondary | ICD-10-CM | POA: Diagnosis not present

## 2019-10-16 DIAGNOSIS — I10 Essential (primary) hypertension: Secondary | ICD-10-CM | POA: Diagnosis not present

## 2019-10-16 DIAGNOSIS — L817 Pigmented purpuric dermatosis: Secondary | ICD-10-CM

## 2019-10-16 DIAGNOSIS — R109 Unspecified abdominal pain: Secondary | ICD-10-CM | POA: Diagnosis present

## 2019-10-16 DIAGNOSIS — K8020 Calculus of gallbladder without cholecystitis without obstruction: Secondary | ICD-10-CM | POA: Insufficient documentation

## 2019-10-16 DIAGNOSIS — Z3189 Encounter for other procreative management: Secondary | ICD-10-CM

## 2019-10-16 LAB — CBC
HCT: 38.6 % (ref 36.0–46.0)
Hemoglobin: 13.3 g/dL (ref 12.0–15.0)
MCH: 30.7 pg (ref 26.0–34.0)
MCHC: 34.5 g/dL (ref 30.0–36.0)
MCV: 89.1 fL (ref 80.0–100.0)
Platelets: 357 10*3/uL (ref 150–400)
RBC: 4.33 MIL/uL (ref 3.87–5.11)
RDW: 12.2 % (ref 11.5–15.5)
WBC: 13.9 10*3/uL — ABNORMAL HIGH (ref 4.0–10.5)
nRBC: 0 % (ref 0.0–0.2)

## 2019-10-16 LAB — COMPREHENSIVE METABOLIC PANEL
ALT: 21 U/L (ref 0–44)
AST: 28 U/L (ref 15–41)
Albumin: 4.5 g/dL (ref 3.5–5.0)
Alkaline Phosphatase: 56 U/L (ref 38–126)
Anion gap: 13 (ref 5–15)
BUN: 13 mg/dL (ref 6–20)
CO2: 25 mmol/L (ref 22–32)
Calcium: 9.7 mg/dL (ref 8.9–10.3)
Chloride: 98 mmol/L (ref 98–111)
Creatinine, Ser: 0.73 mg/dL (ref 0.44–1.00)
GFR, Estimated: >60 mL/min (ref >60)
Glucose, Bld: 117 mg/dL — ABNORMAL HIGH (ref 70–99)
Potassium: 3 mmol/L — ABNORMAL LOW (ref 3.5–5.1)
Sodium: 136 mmol/L (ref 135–145)
Total Bilirubin: 0.7 mg/dL (ref 0.3–1.2)
Total Protein: 8.3 g/dL — ABNORMAL HIGH (ref 6.5–8.1)

## 2019-10-16 LAB — C-REACTIVE PROTEIN: CRP: 1.1 mg/dL (ref 0.5–20.0)

## 2019-10-16 LAB — LIPASE, BLOOD: Lipase: 36 U/L (ref 11–51)

## 2019-10-16 LAB — IBC + FERRITIN
Ferritin: 23 ng/mL (ref 10.0–291.0)
Iron: 80 ug/dL (ref 42–145)
Saturation Ratios: 17.2 % — ABNORMAL LOW (ref 20.0–50.0)
Transferrin: 332 mg/dL (ref 212.0–360.0)

## 2019-10-16 LAB — POCT PREGNANCY, URINE: Preg Test, Ur: NEGATIVE

## 2019-10-16 MED ORDER — FENTANYL CITRATE (PF) 100 MCG/2ML IJ SOLN
50.0000 ug | INTRAMUSCULAR | Status: DC | PRN
  Administered 2019-10-16: 50 ug via NASAL
  Filled 2019-10-16: qty 2

## 2019-10-16 NOTE — Patient Instructions (Addendum)
Please stop your iron pills.   You will get iron in your pre-natal vitamins.  Check iron panel today.    Check CRP today as well.    I placed referral into [facility] with Dr. Thomasene Mohair if you agree to discuss your prepregnancy plans with medication management.  Follow-up office visit in 6 mos. If pregnant- will see you back after delivery as your OB/GYN will assume your health care.       Preparing for Pregnancy If you are considering becoming pregnant, make an appointment to see your regular health care provider to learn how to prepare for a safe and healthy pregnancy (preconception care). During a preconception care visit, your health care provider will:  Do a complete physical exam, including a Pap test.  Take a complete medical history.  Give you information, answer your questions, and help you resolve problems. Preconception checklist Medical history  Tell your health care provider about any current or past medical conditions. Your pregnancy or your ability to become pregnant may be affected by chronic conditions, such as diabetes, chronic hypertension, and thyroid problems.  Include your family's medical history as well as your partner's medical history.  Tell your health care provider about any history of STIs (sexually transmitted infections).These can affect your pregnancy. In some cases, they can be passed to your baby. Discuss any concerns that you have about STIs.  If indicated, discuss the benefits of genetic testing. This testing will show whether there are any genetic conditions that may be passed from you or your partner to your baby.  Tell your health care provider about: ? Any problems you have had with conception or pregnancy. ? Any medicines you take. These include vitamins, herbal supplements, and over-the-counter medicines. ? Your history of immunizations. Discuss any vaccinations that you may need. Diet  Ask your health care provider what  to include in a healthy diet that has a balance of nutrients. This is especially important when you are pregnant or preparing to become pregnant.  Ask your health care provider to help you reach a healthy weight before pregnancy. ? If you are overweight, you may be at higher risk for certain complications, such as high blood pressure, diabetes, and preterm birth. ? If you are underweight, you are more likely to have a baby who has a low birth weight. Lifestyle, work, and home  Let your health care provider know: ? About any lifestyle habits that you have, such as alcohol use, drug use, or smoking. ? About recreational activities that may put you at risk during pregnancy, such as downhill skiing and certain exercise programs. ? Tell your health care provider about any international travel, especially any travel to places with an active [place] virus outbreak. ? About harmful substances that you may be exposed to at work or at home. These include chemicals, pesticides, radiation, or even litter boxes. ? If you do not feel safe at home. Mental health  Tell your health care provider about: ? Any history of mental health conditions, including feelings of depression, sadness, or anxiety. ? Any medicines that you take for a mental health condition. These include herbs and supplements. Home instructions to prepare for pregnancy Lifestyle   Eat a balanced diet. This includes fresh fruits and vegetables, whole grains, lean meats, low-fat dairy products, healthy fats, and foods that are high in fiber. Ask to meet with a nutritionist or registered dietitian for assistance with meal planning and goals.  Get regular exercise. Try to  be active for at least 30 minutes a day on most days of the week. Ask your health care provider which activities are safe during pregnancy.  Do not use any products that contain nicotine or tobacco, such as cigarettes and e-cigarettes. If you need help quitting, ask your health  care provider.  Do not drink alcohol.  Do not take illegal drugs.  Maintain a healthy weight. Ask your health care provider what weight range is right for you. General instructions  Keep an accurate record of your menstrual periods. This makes it easier for your health care provider to determine your baby's due date.  Begin taking prenatal vitamins and folic acid supplements daily as directed by your health care provider.  Manage any chronic conditions, such as high blood pressure and diabetes, as told by your health care provider. This is important. How do I know that I am pregnant? You may be pregnant if you have been sexually active and you miss your period. Symptoms of early pregnancy include:  Mild cramping.  Very light vaginal bleeding (spotting).  Feeling unusually tired.  Nausea and vomiting (morning sickness). If you have any of these symptoms and you suspect that you might be pregnant, you can take a home pregnancy test. These tests check for a hormone in your urine (human chorionic gonadotropin, or hCG). A woman's body begins to make this hormone during early pregnancy. These tests are very accurate. Wait until at least the first day after you miss your period to take one. If the test shows that you are pregnant (you get a positive result), call your health care provider to make an appointment for prenatal care. What should I do if I become pregnant?      Make an appointment with your health care provider as soon as you suspect you are pregnant.  Do not use any products that contain nicotine, such as cigarettes, chewing tobacco, and e-cigarettes. If you need help quitting, ask your health care provider.  Do not drink alcoholic beverages. Alcohol is related to a number of birth defects.  Avoid toxic odors and chemicals.  You may continue to have sexual intercourse if it does not cause pain or other problems, such as vaginal bleeding. This information is not intended  to replace advice given to you by your health care provider. Make sure you discuss any questions you have with your health care provider. Document Revised: 12/20/2016 Document Reviewed: 07/10/2015 Elsevier Patient Education  2020 [employer].

## 2019-10-16 NOTE — ED Triage Notes (Signed)
Pt arrives via [facility] with c/o central abdominal pain which started around 2215. Pt appears in pain at this time but is in NAD.

## 2019-10-16 NOTE — Progress Notes (Signed)
Established Patient Office Visit  Subjective:  Patient ID: Samantha Glass, female    DOB: 14-Sep-1990  Age: 29 y.o. MRN: [id number]  CC:  Chief Complaint  Patient presents with  . Acute Visit    discuss pre preg health and stopping medication    HPI Nakisha R Savarese presents to discuss coming off her amlodipine before she gets pregnant.  She has had longstanding hypertension and is also on HCTZ.  She will need substitute medication for hypertension.   In addition, she is on longstanding Wellbutrin for depression well controlled over 9 years.    She saw dermatology for her ankle petechiae and was diagnosed with pigmented purpuric dermatosis and is applying the steroid cream that seems to help it fade.  The cause  of her lower ankle capillaritis could be from history of Covid infection, iron supplementation.  Dermatology who tells her dermatologist tells her this is a benign process and will take time to resolve.  No further intervention needed.    Patient is also on iron therapy for history of iron deficiency without anemia.  She has not started prenatal vitamins yet.  We will check iron panel.    Past Medical History:  Diagnosis Date  . Depression   . 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 Hypertension   . IDA (iron deficiency anemia) 04/14/2019  . Obesity (BMI 30.0-34.9) 04/14/2019  . UTI (urinary tract infection)     Past Surgical History:  Procedure Laterality Date  . TONSILLECTOMY  age 59    Family History  Problem Relation Age of Onset  . Hypertension Mother   . Depression Mother   . Hypertension Maternal Grandmother   . Arthritis Maternal Grandmother   . Diabetes Paternal Grandmother   . Hypertension Paternal Grandmother   . Hyperlipidemia Paternal Grandmother   . Hypertension Paternal Grandfather     Social History   Socioeconomic History  . Marital status: Married    Spouse name: Not on file  . Number of children: Not  on file  . Years of education: Not on file  . Highest education level: Not on file  Occupational History  . Not on file  Tobacco Use  . Smoking status: Never Smoker  . Smokeless tobacco: Never Used  Substance and Sexual Activity  . Alcohol use: Yes    Comment: occassioanlly  . Drug use: No  . Sexual activity: Yes  Other Topics Concern  . Not on file  Social History Narrative  . Not on file   Social Determinants of Health   Financial Resource Strain:   . Difficulty of Paying Living Expenses: Not on file  Food Insecurity:   . Worried About Programme researcher, broadcasting/film/video in the Last Year: Not on file  . Ran Out of Food in the Last Year: Not on file  Transportation Needs:   . Lack of Transportation (Medical): Not on file  . Lack of Transportation (Non-Medical): Not on file  Physical Activity:   . Days of Exercise per Week: Not on file  . Minutes of Exercise per Session: Not on file  Stress:   . Feeling of Stress : Not on file  Social Connections:   . Frequency of Communication with Friends and Family: Not on file  . Frequency of Social Gatherings with Friends and Family: Not on file  . Attends Religious Services: Not on file  . Active Member of Clubs or Organizations: Not on file  . Attends Club or  Organization Meetings: Not on file  . Marital Status: Not on file  Intimate Partner Violence:   . Fear of Current or Ex-Partner: Not on file  . Emotionally Abused: Not on file  . Physically Abused: Not on file  . Sexually Abused: Not on file    Outpatient Medications Prior to Visit  Medication Sig Dispense Refill  . amLODipine (NORVASC) 10 MG tablet Take 1 tablet (10 mg total) by mouth daily. 30 tablet 3  . buPROPion (WELLBUTRIN XL) 300 MG 24 hr tablet TAKE 1 TABLET BY MOUTH EVERY DAY 90 tablet 2  . ferrous sulfate 325 (65 FE) MG tablet Take 325 mg by mouth daily with breakfast.    . hydrochlorothiazide (HYDRODIURIL) 25 MG tablet TAKE 1 TABLET BY MOUTH EVERY DAY 90 tablet 1  .  loratadine (CLARITIN) 10 MG tablet Take 10 mg by mouth daily.    . Norgestimate-Ethinyl Estradiol Triphasic (TRI-SPRINTEC) 0.18/0.215/0.25 MG-35 MCG tablet Take 1 tablet by mouth daily.     No facility-administered medications prior to visit.    No Known Allergies  Review of Systems  Constitutional: Negative.   Respiratory: Negative.   Cardiovascular: Negative.       Objective:    Physical Exam Vitals reviewed.  Constitutional:      Appearance: Normal appearance.  HENT:     Head: Normocephalic.  Cardiovascular:     Rate and Rhythm: Normal rate and regular rhythm.     Pulses: Normal pulses.     Heart sounds: Normal heart sounds.  Pulmonary:     Effort: Pulmonary effort is normal.     Breath sounds: Normal breath sounds.  Skin:    General: Skin is warm and dry.     Findings: Rash present.     Comments: Lower extremity bilateral ankle  petechiae is fading. No new rash.   Neurological:     Mental Status: She is alert.     BP 112/84 (BP Location: Left Arm, Patient Position: Sitting, Cuff Size: Normal)   Pulse 94   Temp 98.5 F (36.9 C) (Oral)   Ht 5\' 11"  (1.803 m)   Wt 258 lb (117 kg)   SpO2 98%   BMI 35.98 kg/m  Wt Readings from Last 3 Encounters:  10/16/19 258 lb (117 kg)  07/01/19 247 lb (112 kg)  06/19/19 244 lb (110.7 kg)     Health Maintenance Due  Topic Date Due  . Hepatitis C Screening  Never done  . HIV Screening  Never done  . TETANUS/TDAP  06/15/2018    There are no preventive care reminders to display for this patient.  Lab Results  Component Value Date   TSH 1.95 04/14/2019   Lab Results  Component Value Date   WBC 8.2 07/01/2019   HGB 13.9 07/01/2019   HCT 41.1 07/01/2019   MCV 91.1 07/01/2019   PLT 378 07/01/2019   Lab Results  Component Value Date   NA 138 07/13/2019   K 3.9 07/13/2019   CO2 26 07/13/2019   GLUCOSE 97 07/13/2019   BUN 7 07/13/2019   CREATININE 0.78 07/13/2019   BILITOT 0.6 06/10/2019   ALKPHOS 48  06/10/2019   AST 15 06/10/2019   ALT 17 06/10/2019   PROT 7.9 06/10/2019   ALBUMIN 4.1 06/10/2019   CALCIUM 9.3 07/13/2019   ANIONGAP 9 07/13/2019   GFR 109.06 04/14/2019   Lab Results  Component Value Date   CHOL 149 04/14/2019   Lab Results  Component Value Date  HDL 58.30 04/14/2019   Lab Results  Component Value Date   LDLCALC 71 04/14/2019   Lab Results  Component Value Date   TRIG 98.0 04/14/2019   Lab Results  Component Value Date   CHOLHDL 3 04/14/2019   Lab Results  Component Value Date   HGBA1C 5.1 04/14/2019      Assessment & Plan:   Problem List Items Addressed This Visit      Cardiovascular and Mediastinum   Essential hypertension   Pigmented purpuric dermatosis     Other   Depression, major, single episode, complete remission (HCC)   IDA (iron deficiency anemia) - Primary   BMI 35.0-35.9,adult   Elevated C-reactive protein (CRP)      No orders of the defined types were placed in this encounter.  Please stop your iron pills.   You will get iron in your pre-natal vitamins.  Check iron panel today.    Check CRP today as well.    I placed referral into [facility] with Dr. Thomasene Mohair if you agree to discuss your prepregnancy plans with medication management. .  Follow-up: No follow-ups on file.  This visit occurred during the SARS-CoV-2 public health emergency.  Safety protocols were in place, including screening questions prior to the visit, additional usage of staff PPE, and extensive cleaning of exam room while observing appropriate contact time as indicated for disinfecting solutions.    Amedeo Kinsman, NP

## 2019-10-16 NOTE — Telephone Encounter (Signed)
[facility] referring for Pre conception counseling. Called and left voicemail for patient to call back to be scheduled.

## 2019-10-17 ENCOUNTER — Emergency Department
Admission: EM | Admit: 2019-10-17 | Discharge: 2019-10-17 | Disposition: A | Discharge status: Home / Self Care | Payer: Managed Care, Other (non HMO) | Attending: Emergency Medicine | Admitting: Emergency Medicine

## 2019-10-17 ENCOUNTER — Emergency Department: Payer: Managed Care, Other (non HMO)

## 2019-10-17 DIAGNOSIS — K8020 Calculus of gallbladder without cholecystitis without obstruction: Secondary | ICD-10-CM

## 2019-10-17 DIAGNOSIS — K8050 Calculus of bile duct without cholangitis or cholecystitis without obstruction: Secondary | ICD-10-CM

## 2019-10-17 LAB — URINALYSIS, COMPLETE (UACMP) WITH MICROSCOPIC
Bacteria, UA: NONE SEEN
Bilirubin Urine: NEGATIVE
Glucose, UA: NEGATIVE mg/dL
Hgb urine dipstick: NEGATIVE
Ketones, ur: NEGATIVE mg/dL
Nitrite: NEGATIVE
Protein, ur: NEGATIVE mg/dL
Specific Gravity, Urine: 1.005 (ref 1.005–1.030)
pH: 7 (ref 5.0–8.0)

## 2019-10-17 MED ORDER — ONDANSETRON 4 MG PO TBDP: ORAL_TABLET | ORAL | 0 refills | Status: DC

## 2019-10-17 MED ORDER — HYDROCODONE-ACETAMINOPHEN 5-325 MG PO TABS: 2.0000 | ORAL_TABLET | Freq: Four times a day (QID) | ORAL | 0 refills | Status: DC | PRN

## 2019-10-17 NOTE — ED Provider Notes (Signed)
[facility] Emergency Department Provider Note  ____________________________________________   First MD Initiated Contact with Patient 10/17/19 0327     (approximate)  I have reviewed the triage vital signs and the nursing notes.   HISTORY  Chief Complaint Abdominal Pain    HPI Samantha Glass is a 29 y.o. female who presents for evaluation of acute onset and severe dull aching and occasionally sharp abdominal pain that was acute in onset about 2 hours after eating dinner. She reports that she and her husband had a normal dinner and then they were sitting on the couch a couple of hours after eating and suddenly she had acute onset severe pain in the center of her upper abdomen that radiated through to the back. The pain was constant for several hours and nothing in particular made it better or worse. She has some mild nausea but no vomiting and no diarrhea. No lower abdominal pain. She has never had anything like this happen in the past. Nothing in particular made it worse or better. She received fentanyl upon arrival to the emergency department and that helped a little bit and then over time symptoms went away. Now she feels a little bit "tender" but most of the symptoms have completely resolved and she feels essentially back to normal. She has had a tonsillectomy but has never had an appendectomy nor cholecystectomy. She has not been told that she has gallstones. She had 1 glass of wine with dinner but does not drink regularly.         Past Medical History:  Diagnosis Date  . Depression   . 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 Hypertension   . IDA (iron deficiency anemia) 04/14/2019  . Obesity (BMI 30.0-34.9) 04/14/2019  . UTI (urinary tract infection)     Patient Active Problem List   Diagnosis Date Noted  . BMI 35.0-35.9,adult 10/16/2019  . Elevated C-reactive protein (CRP) 10/16/2019  . 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 Petechiae 06/21/2019  . Routine physical examination 06/19/2019  . 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 Obesity (BMI 30.0-34.9) 04/14/2019  . IDA (iron deficiency anemia) 04/14/2019  . Essential hypertension 05/14/2012    Past Surgical History:  Procedure Laterality Date  . TONSILLECTOMY  age 76    Prior to Admission medications   Medication Sig Start Date End Date Taking? Authorizing Provider  amLODipine (NORVASC) 10 MG tablet Take 1 tablet (10 mg total) by mouth daily. 09/08/19   Theadore Nan, NP  buPROPion (WELLBUTRIN XL) 300 MG 24 hr tablet TAKE 1 TABLET BY MOUTH EVERY DAY 05/07/19   Theadore Nan, NP  ferrous sulfate 325 (65 FE) MG tablet Take 325 mg by mouth daily with breakfast.    [provider]  hydrochlorothiazide (HYDRODIURIL) 25 MG tablet TAKE 1 TABLET BY MOUTH EVERY DAY 07/07/19   Theadore Nan, NP  HYDROcodone-acetaminophen (NORCO/VICODIN) 5-325 MG tablet Take 2 tablets by mouth every 6 (six) hours as needed for moderate pain or severe pain. 10/17/19   Loleta Rose, MD  ondansetron (ZOFRAN ODT) 4 MG disintegrating tablet Allow 1-2 tablets to dissolve in your mouth every 8 hours as needed for nausea/vomiting 10/17/19   Loleta Rose, MD    Allergies Patient has no known allergies.  Family History  Problem Relation Age of Onset  . Hypertension Mother   . Depression Mother   . Hypertension Maternal Grandmother   . Arthritis Maternal Grandmother   .  Diabetes Paternal Grandmother   . Hypertension Paternal Grandmother   . Hyperlipidemia Paternal Grandmother   . Hypertension Paternal Grandfather     Social History Social History   Tobacco Use  . Smoking status: Never Smoker  . Smokeless tobacco: Never Used  Substance Use Topics  . Alcohol use: Yes    Comment: occassioanlly  . Drug use: No    Review of Systems Constitutional: No fever/chills Eyes: No visual changes. ENT: No sore  throat. Cardiovascular: Denies chest pain. Respiratory: Denies shortness of breath. Gastrointestinal: Middle upper abdominal pain with mild nausea. Genitourinary: Negative for dysuria. Musculoskeletal: Negative for neck pain.  Negative for back pain. Integumentary: Negative for rash. Neurological: Negative for headaches, focal weakness or numbness.   ____________________________________________   PHYSICAL EXAM:  VITAL SIGNS: ED Triage Vitals  Enc Vitals Group     BP 10/16/19 2255 124/81     Pulse Rate 10/16/19 2255 92     Resp 10/16/19 2255 18     Temp 10/16/19 2255 98.2 F (36.8 C)     Temp Source 10/16/19 2255 Oral     SpO2 10/16/19 2255 100 %     Weight 10/16/19 2248 111.1 kg (245 lb)     Height 10/16/19 2248 1.803 m (5\' 11" )     Head Circumference --      Peak Flow --      Pain Score 10/16/19 2248 8     Pain Loc --      Pain Edu? --      Excl. in GC? --     Constitutional: Alert and oriented.  Eyes: Conjunctivae are normal.  Head: Atraumatic. Nose: No congestion/rhinnorhea. Mouth/Throat: Patient is wearing a mask. Neck: No stridor.  No meningeal signs.   Cardiovascular: Normal rate, regular rhythm. Good peripheral circulation. Grossly normal heart sounds. Respiratory: Normal respiratory effort.  No retractions. Gastrointestinal: Soft with tenderness to palpation of the epigastrium and less tenderness to the right upper quadrant although it is still present. Equivocal Murphy sign. No lower abdominal tenderness with no rebound and no guarding. Musculoskeletal: No lower extremity tenderness nor edema. No gross deformities of extremities. Neurologic:  Normal speech and language. No gross focal neurologic deficits are appreciated.  Skin:  Skin is warm, dry and intact. Psychiatric: Mood and affect are normal. Speech and behavior are normal.  ____________________________________________   LABS (all labs ordered are listed, but only abnormal results are  displayed)  Labs Reviewed  COMPREHENSIVE METABOLIC PANEL - Abnormal; Notable for the following components:      Result Value   Potassium 3.0 (*)    Glucose, Bld 117 (*)    Total Protein 8.3 (*)    All other components within normal limits  CBC - Abnormal; Notable for the following components:   WBC 13.9 (*)    All other components within normal limits  URINALYSIS, COMPLETE (UACMP) WITH MICROSCOPIC - Abnormal; Notable for the following components:   Color, Urine STRAW (*)    APPearance CLEAR (*)    Leukocytes,Ua TRACE (*)    All other components within normal limits  LIPASE, BLOOD  POC URINE PREG, ED  POCT PREGNANCY, URINE   ____________________________________________  EKG  ED ECG REPORT I, 10/18/19, the attending physician, personally viewed and interpreted this ECG.  Date: 10/16/2019 EKG Time: 22: 38 Rate: 92 Rhythm: normal sinus rhythm QRS Axis: normal Intervals: normal ST/T Wave abnormalities: normal Narrative Interpretation: no evidence of acute ischemia  ____________________________________________  RADIOLOGY 10/18/2019, personally viewed  and evaluated these images (plain radiographs) as part of my medical decision making, as well as reviewing the written report by the radiologist.  ED MD interpretation: Right upper quadrant ultrasound demonstrates cholelithiasis without cholecystitis.  Normal common bile duct.  Official radiology report(s): US ABDOMEN LIMITED RUQ (LIVER/GB)  Result Date: 10/17/2019 CLINICAL DATA:  Right upper quadrant abdominal pain, nausea EXAM: ULTRASOUND ABDOMEN LIMITED RIGHT UPPER QUADRANT COMPARISON:  None. FINDINGS: Gallbladder: Multiple gallstones, measuring up to 14 mm. No gallbladder wall thickening or pericholecystic fluid. Negative sonographic Murphy's sign. Common bile duct: Diameter: 3 mm Liver: Within the upper limits of normal for parenchymal echogenicity. No focal hepatic lesion is seen. Portal vein is patent on color  Doppler imaging with normal direction of blood flow towards the liver. Other: None. IMPRESSION: Cholelithiasis, without associated sonographic findings to suggest acute cholecystitis. Electronically Signed   By: Charline Bills M.D.   On: 10/17/2019 05:13    ____________________________________________   PROCEDURES   Procedure(s) performed (including Critical Care):  Procedures   ____________________________________________   INITIAL IMPRESSION / MDM / ASSESSMENT AND PLAN / ED COURSE  As part of my medical decision making, I reviewed the following data within the electronic MEDICAL RECORD NUMBER History obtained from family, Nursing notes reviewed and incorporated, Labs reviewed , EKG interpreted , Old chart reviewed, Notes from prior ED visits and [place] Controlled Substance Database   Differential diagnosis includes, but is not limited to, biliary colic, acute cholecystitis, acid reflux/GERD, pancreatitis, less likely aortic dissection, pneumonia, PE.  The patient is well-appearing and in no distress with normal vital signs at this time. Based on the acute onset of severe postprandial upper abdominal discomfort and some residual tenderness to palpation on exam now even though most of her pain is resolved, I suspect she has gallstones but doubt she is suffering from acute cholecystitis. Lab work is notable for mild leukocytosis which could be a stress reaction 2 hours of pain that is now resolved. Comprehensive metabolic panel is essentially normal other than hypokalemia of 3.0 but I suspect this is primarily dietary because she has not had any vomiting or diarrhea. Urine pregnancy test is negative and urinalysis is unremarkable.  We will obtain a right upper quadrant ultrasound to help the patient with a specific diagnosis, to rule out cholecystitis, and to help provide additional follow-up planning. Patient and husband understand and agree.       Clinical Course as of Oct 17 542  Sat Oct 17, 2019  0543 Cholelithiasis, not cholecystitis.  Patient without pain at this time.  Stable vitals.  Updated her and her husband about the results and discussed outpatient symptom management and recommendations for outpatient follow-up.  The patient and her husband understand and agree with the plan.  Prescriptions as indicated below.  US ABDOMEN LIMITED RUQ (LIVER/GB) [CF]    Clinical Course User Index [CF] Loleta Rose, MD     ____________________________________________  FINAL CLINICAL IMPRESSION(S) / ED DIAGNOSES  Final diagnoses:  Biliary colic  Calculus of gallbladder without cholecystitis without obstruction     MEDICATIONS GIVEN DURING THIS VISIT:  Medications  fentaNYL (SUBLIMAZE) injection 50 mcg (50 mcg Nasal Given 10/16/19 2257)     ED Discharge Orders         Ordered    HYDROcodone-acetaminophen (NORCO/VICODIN) 5-325 MG tablet  Every 6 hours PRN        10/17/19 0527    ondansetron (ZOFRAN ODT) 4 MG disintegrating tablet  10/17/19 0527          *Please note:  Angelena SoleJillian R Linthicum was evaluated in Emergency Department on 10/17/2019 for the symptoms described in the history of present illness. She was evaluated in the context of the global COVID-19 pandemic, which necessitated consideration that the patient might be at risk for infection with the SARS-CoV-2 virus that causes COVID-19. Institutional protocols and algorithms that pertain to the evaluation of patients at risk for COVID-19 are in a state of rapid change based on information released by regulatory bodies including the CDC and federal and state organizations. These policies and algorithms were followed during the patient's care in the ED.  Some ED evaluations and interventions may be delayed as a result of limited staffing during and after the pandemic.*  Note:  This document was prepared using Dragon voice recognition software and may include unintentional dictation errors.   Loleta RoseForbach, Chayce Rullo,  MD 10/17/19 [id number]

## 2019-10-17 NOTE — ED Notes (Signed)
DC reviewed by provider, denies needs or concerns. Ambulated out of ED with visitor.

## 2019-10-17 NOTE — ED Notes (Signed)
Assumed care of pt at this time. 2/10 pain. Awaiting [place] results. Family at bedside. AO x4

## 2019-10-17 NOTE — Discharge Instructions (Addendum)
You have been seen in the Emergency Department (ED) for abdominal pain.  Your evaluation suggests that your pain is caused by gallstones.  Fortunately you do not need immediate surgery at this time, but it is important that you follow up with a surgeon as an outpatient; typically surgical removal of the gallbladder is the only thing that will definitively fix your issue.  Read through the included information about a bland diet, and use any prescribed medications as instructed.  Avoid smoking and alcohol use. ? ?Please follow up as instructed above regarding today?s emergent visit and the symptoms that are bothering you. ? ?Take Norco as prescribed. Do not drink alcohol, drive or participate in any other potentially dangerous activities while taking this medication as it may make you sleepy. Do not take this medication with any other sedating medications, either prescription or over-the-counter. If you were prescribed Percocet or Vicodin, do not take these with acetaminophen (Tylenol) as it is already contained within these medications. ?  ?This medication is an opiate (or narcotic) pain medication and can be habit forming.  Use it as little as possible to achieve adequate pain control.  Do not use or use it with extreme caution if you have a history of opiate abuse or dependence.  If you are on a pain contract with your primary care doctor or a pain specialist, be sure to let them know you were prescribed this medication today from the [facility].  This medication is intended for your use only - do not give any to anyone else and keep it in a secure place where nobody else, especially children, have access to it.  It will also cause or worsen constipation, so you may want to consider taking an over-the-counter stool softener while you are taking this medication. ? ?Return to the ED if your abdominal pain worsens or fails to improve, you develop bloody vomiting, bloody diarrhea, you are  unable to tolerate fluids due to vomiting, fever greater than 101, or other symptoms that concern you. ? ?

## 2019-10-19 ENCOUNTER — Ambulatory Visit: Payer: Managed Care, Other (non HMO) | Admitting: Nurse Practitioner

## 2019-10-19 ENCOUNTER — Encounter: Payer: Self-pay | Admitting: Nurse Practitioner

## 2019-10-27 ENCOUNTER — Other Ambulatory Visit: Payer: Self-pay

## 2019-10-27 ENCOUNTER — Ambulatory Visit (INDEPENDENT_AMBULATORY_CARE_PROVIDER_SITE_OTHER): Payer: Managed Care, Other (non HMO) | Admitting: Obstetrics and Gynecology

## 2019-10-27 ENCOUNTER — Encounter: Payer: Self-pay | Admitting: Obstetrics and Gynecology

## 2019-10-27 VITALS — BP 126/88 | HR 89 | Ht 71.0 in | Wt 254.7 lb

## 2019-10-27 DIAGNOSIS — Z3169 Encounter for other general counseling and advice on procreation: Secondary | ICD-10-CM

## 2019-10-27 NOTE — Progress Notes (Signed)
HPI:      Samantha Glass is a 29 y.o. G0P0 who LMP was Patient's last menstrual period was 10/05/2019 (exact date).  Subjective:   She presents today to establish care and discuss preconceptual counseling.  Patient is taking multiple medications and has some concerns regarding which ones she should modify or discontinue prior to getting pregnant.  (Blood pressure and antidepressant) in addition she has recently been diagnosed with cholelithiasis and is considering surgery. She was using birth control until proximately June when she stopped.  Since that time she has had normal regular monthly cycles. She takes iron and prenatal vitamins daily at this time.  She would like to conceive " within the next year".    Hx: The following portions of the patient's history were reviewed and updated as appropriate:             She  has a past medical history of Depression, Depression, major, single episode, complete remission (HCC) (04/14/2019), Hypertension, IDA (iron deficiency anemia) (04/14/2019), Obesity (BMI 30.0-34.9) (04/14/2019), and UTI (urinary tract infection). She does not have any pertinent problems on file. She  has a past surgical history that includes Tonsillectomy (age 59). Her family history includes Arthritis in her maternal grandmother; Depression in her mother; Diabetes in her paternal grandmother; Hyperlipidemia in her paternal grandmother; Hypertension in her maternal grandmother, mother, paternal grandfather, and paternal grandmother. She  reports that she has never smoked. She has never used smokeless tobacco. She reports current alcohol use. She reports that she does not use drugs. She has a current medication list which includes the following prescription(s): amlodipine, bupropion, ferrous sulfate, hydrochlorothiazide, hydrocodone-acetaminophen, and ondansetron. She has No Known Allergies.       Review of Systems:  Review of Systems  Constitutional: Denied constitutional  symptoms, night sweats, recent illness, fatigue, fever, insomnia and weight loss.  Eyes: Denied eye symptoms, eye pain, photophobia, vision change and visual disturbance.  Ears/Nose/Throat/Neck: Denied ear, nose, throat or neck symptoms, hearing loss, nasal discharge, sinus congestion and sore throat.  Cardiovascular: Denied cardiovascular symptoms, arrhythmia, chest pain/pressure, edema, exercise intolerance, orthopnea and palpitations.  Respiratory: Denied pulmonary symptoms, asthma, pleuritic pain, productive sputum, cough, dyspnea and wheezing.  Gastrointestinal: Denied, gastro-esophageal reflux, melena, nausea and vomiting.  Genitourinary: Denied genitourinary symptoms including symptomatic vaginal discharge, pelvic relaxation issues, and urinary complaints.  Musculoskeletal: Denied musculoskeletal symptoms, stiffness, swelling, muscle weakness and myalgia.  Dermatologic: Denied dermatology symptoms, rash and scar.  Neurologic: Denied neurology symptoms, dizziness, headache, neck pain and syncope.  Psychiatric: Denied psychiatric symptoms, anxiety and depression.  Endocrine: Denied endocrine symptoms including hot flashes and night sweats.   Meds:   Current Outpatient Medications on File Prior to Visit  Medication Sig Dispense Refill  . amLODipine (NORVASC) 10 MG tablet Take 1 tablet (10 mg total) by mouth daily. 30 tablet 3  . buPROPion (WELLBUTRIN XL) 300 MG 24 hr tablet TAKE 1 TABLET BY MOUTH EVERY DAY 90 tablet 2  . ferrous sulfate 325 (65 FE) MG tablet Take 325 mg by mouth daily with breakfast.    . hydrochlorothiazide (HYDRODIURIL) 25 MG tablet TAKE 1 TABLET BY MOUTH EVERY DAY 90 tablet 1  . HYDROcodone-acetaminophen (NORCO/VICODIN) 5-325 MG tablet Take 2 tablets by mouth every 6 (six) hours as needed for moderate pain or severe pain. (Patient not taking: Reported on 10/27/2019) 16 tablet 0  . ondansetron (ZOFRAN ODT) 4 MG disintegrating tablet Allow 1-2 tablets to dissolve in your  mouth every 8 hours as needed  for nausea/vomiting (Patient not taking: Reported on 10/27/2019) 30 tablet 0   No current facility-administered medications on file prior to visit.       [facility] - 10/27/19 1327      Pregnancy Intention Screening   Does the patient want to become pregnant in the next year? Yes    Does the patient's partner want to become pregnant in the next year? Yes    Would the patient like to discuss contraceptive options today? No      Contraception Wrap Up   Current Method No Contraceptive Precautions    End Method No Contraception Precautions    Contraception Counseling Provided No          The pregnancy intention screening data noted above was reviewed. Potential methods of contraception were discussed. The patient elected to proceed with Female Condom.     Objective:     Vitals:   10/27/19 1325  BP: 126/88  Pulse: 89   Filed Weights   10/27/19 1325  Weight: 254 lb 11.2 oz (115.5 kg)                Assessment:    G0P0 Patient Active Problem List   Diagnosis Date Noted  . BMI 35.0-35.9,adult 10/16/2019  . Elevated C-reactive protein (CRP) 10/16/2019  . 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 Petechiae 06/21/2019  . Routine physical examination 06/19/2019  . 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 Obesity (BMI 30.0-34.9) 04/14/2019  . IDA (iron deficiency anemia) 04/14/2019  . Essential hypertension 05/14/2012     1. Encounter for preconception consultation        Plan:            1.  We have discussed safe medications during pregnancy.  We have gone through her medication list and I have recommended that she change her blood pressure medications to one that is more acceptable during pregnancy.  In this way if her blood pressure is controlled prior to pregnancy we can continue that medication during her pregnancy.   The risks and benefits of Wellbutrin use during pregnancy have been discussed.  2.   Cholelithiasis and possible surgery was discussed.  My recommendation is to have the surgery prior to becoming pregnant as pregnancy often exacerbates gallbladder symptoms.  It is also more difficult to have surgery during pregnancy.  3.  Continued use of prenatal vitamins and iron discussed especially preconception.  4.  Timing of intercourse using menstrual cycle, use of ovulation predictor kits discussed. (Patient is likely ovulatory with her normal regular monthly menses)  Orders No orders of the defined types were placed in this encounter.   No orders of the defined types were placed in this encounter.     F/U  Return in about 3 months (around 01/27/2020), or if symptoms worsen or fail to improve, for Annual Physical. I spent 31 minutes involved in the care of this patient preparing to see the patient by obtaining and reviewing her medical history (including labs, imaging tests and prior procedures), documenting clinical information in the electronic health record (EHR), counseling and coordinating care plans, writing and sending prescriptions, ordering tests or procedures and directly communicating with the patient by discussing pertinent items from her history and physical exam as well as detailing my assessment and plan as noted above so that she has an informed understanding.  All of her questions were answered.  Elonda Husky, M.D. 10/27/2019 4:22 PM

## 2019-10-30 ENCOUNTER — Encounter: Payer: Self-pay | Admitting: Nurse Practitioner

## 2019-10-30 ENCOUNTER — Other Ambulatory Visit: Payer: Self-pay

## 2019-10-30 ENCOUNTER — Ambulatory Visit: Payer: Managed Care, Other (non HMO) | Admitting: Nurse Practitioner

## 2019-10-30 VITALS — BP 124/80 | HR 101 | Temp 98.8°F | Ht 71.0 in | Wt 252.0 lb

## 2019-10-30 DIAGNOSIS — I10 Essential (primary) hypertension: Secondary | ICD-10-CM | POA: Diagnosis not present

## 2019-10-30 DIAGNOSIS — E876 Hypokalemia: Secondary | ICD-10-CM | POA: Diagnosis not present

## 2019-10-30 DIAGNOSIS — D509 Iron deficiency anemia, unspecified: Secondary | ICD-10-CM

## 2019-10-30 DIAGNOSIS — K8020 Calculus of gallbladder without cholecystitis without obstruction: Secondary | ICD-10-CM | POA: Diagnosis not present

## 2019-10-30 NOTE — Patient Instructions (Addendum)
Continue with your plans to talk about gallbladder surgery before pregnancy with the surgeon on Monday.  Continue with the low fat diet you are consuming.   Please call back with your surgery plans and we can decide when to switch your BP meds to labetalol.  Labs today and will call with results. Continue with the iron supplements as recommended.   I wish you the very best!   Cholelithiasis  Cholelithiasis is a form of gallbladder disease in which gallstones form in the gallbladder. The gallbladder is an organ that stores bile. Bile is made in the liver, and it helps to digest fats. Gallstones begin as small crystals and slowly grow into stones. They may cause no symptoms until the gallbladder tightens (contracts) and a gallstone is blocking the duct (gallbladder attack), which can cause pain. Cholelithiasis is also referred to as gallstones. There are two main types of gallstones:  Cholesterol stones. These are made of hardened cholesterol and are usually yellow-green in color. They are the most common type of gallstone. Cholesterol is a white, waxy, fat-like substance that is made in the liver.  Pigment stones. These are dark in color and are made of a red-yellow substance that forms when hemoglobin from red blood cells breaks down (bilirubin). What are the causes? This condition may be caused by an imbalance in the substances that bile is made of. This can happen if the bile:  Has too much bilirubin.  Has too much cholesterol.  Does not have enough bile salts. These salts help the body absorb and digest fats. In some cases, this condition can also be caused by the gallbladder not emptying completely or often enough. What increases the risk? The following factors may make you more likely to develop this condition:  Being female.  Having multiple pregnancies. Health care providers sometimes advise removing diseased gallbladders before future pregnancies.  Eating a diet that is heavy  in fried foods, fat, and refined carbohydrates, like white bread and white rice.  Being obese.  Being older than age 52.  Prolonged use of medicines that contain female hormones (estrogen).  Having diabetes mellitus.  Rapidly losing weight.  Having a family history of gallstones.  Being of American [place] or [place] descent.  Having an intestinal disease such as Crohn disease.  Having metabolic syndrome.  Having cirrhosis.  Having severe types of anemia such as sickle cell anemia. What are the signs or symptoms? In most cases, there are no symptoms. These are known as silent gallstones. If a gallstone blocks the bile ducts, it can cause a gallbladder attack. The main symptom of a gallbladder attack is sudden pain in the upper right abdomen. The pain usually comes at night or after eating a large meal. The pain can last for one or several hours and can spread to the right shoulder or chest. If the bile duct is blocked for more than a few hours, it can cause infection or inflammation of the gallbladder, liver, or pancreas, which may cause:  Nausea.  Vomiting.  Abdominal pain that lasts for 5 hours or more.  Fever or chills.  Yellowing of the skin or the whites of the eyes (jaundice).  Dark urine.  Light-colored stools. How is this diagnosed? This condition may be diagnosed based on:  A physical exam.  Your medical history.  An ultrasound of your gallbladder.  CT scan.  MRI.  Blood tests to check for signs of infection or inflammation.  A scan of your gallbladder and bile  ducts (biliary system) using non harmful radioactive material and special cameras that can see the radioactive material (cholescintigram). This test checks to see how your gallbladder contracts and whether bile ducts are blocked.  Inserting a small tube with a camera on the end (endoscope) through your mouth to inspect bile ducts and check for blockages (endoscopic retrograde  cholangiopancreatogram). How is this treated? Treatment for gallstones depends on the severity of the condition. Silent gallstones do not need treatment. If the gallstones cause a gallbladder attack or other symptoms, treatment may be required. Options for treatment include:  Surgery to remove the gallbladder (cholecystectomy). This is the most common treatment.  Medicines to dissolve gallstones. These are most effective at treating small gallstones. You may need to take medicines for up to 6-12 months.  Shock wave treatment (extracorporeal biliary lithotripsy). In this treatment, an ultrasound machine sends shock waves to the gallbladder to break gallstones into smaller pieces. These pieces can then be passed into the intestines or be dissolved by medicine. This is rarely used.  Removing gallstones through endoscopic retrograde cholangiopancreatogram. A small basket can be attached to the endoscope and used to capture and remove gallstones. Follow these instructions at home:  Take over-the-counter and prescription medicines only as told by your health care provider.  Maintain a healthy weight and follow a healthy diet. This includes: ? Reducing fatty foods, such as fried food. ? Reducing refined carbohydrates, like white bread and white rice. ? Increasing fiber. Aim for foods like almonds, fruit, and beans.  Keep all follow-up visits as told by your health care provider. This is important. Contact a health care provider if:  You think you have had a gallbladder attack.  You have been diagnosed with silent gallstones and you develop abdominal pain or indigestion. Get help right away if:  You have pain from a gallbladder attack that lasts for more than 2 hours.  You have abdominal pain that lasts for more than 5 hours.  You have a fever or chills.  You have persistent nausea and vomiting.  You develop jaundice.  You have dark urine or light-colored  stools. Summary  Cholelithiasis (also called gallstones) is a form of gallbladder disease in which gallstones form in the gallbladder.  This condition is caused by an imbalance in the substances that make up bile. This can happen if the bile has too much cholesterol, too much bilirubin, or not enough bile salts.  You are more likely to develop this condition if you are female, pregnant, using medicines with estrogen, obese, older than age 10, or have a family history of gallstones. You may also develop gallstones if you have diabetes, an intestinal disease, cirrhosis, or metabolic syndrome.  Treatment for gallstones depends on the severity of the condition. Silent gallstones do not need treatment.  If gallstones cause a gallbladder attack or other symptoms, treatment may be needed. The most common treatment is surgery to remove the gallbladder. This information is not intended to replace advice given to you by your health care provider. Make sure you discuss any questions you have with your health care provider. Document Revised: 11/30/2016 Document Reviewed: 09/04/2015 Elsevier Patient Education  2020 [employer].

## 2019-10-30 NOTE — Progress Notes (Signed)
Established Patient Office Visit  Subjective:  Patient ID: Samantha Glass, female    DOB: 04/02/1990  Age: 29 y.o. MRN: [id number]  CC:  Chief Complaint  Patient presents with  . Acute Visit    discuss BP medication     HPI Samantha Glass is a 29 yo who presents to discuss recent ER visit for right upper quadrant pain and gallstones with plan moving forward.  The emergency room physician recommended removing the gallbladder before she attempts pregnancy. She has an appt with a general surgeon on Monday. Also, she wants to discuss what blood pressure medication she should be on before she gets pregnant.  [name] had one severe episode of dull aching to sharp pain epigastric area that radiated into her back 2 hours after eating dinner with one glass of wine. This pain lasted several hours and describes it was very severe.  She had associated nausea, no vomiting.  Patient states she never wants to have an experience of pain like this again. She recalls that she has had several milder episodes of this same type of pain.   She presented to the emergency room, received nasal fentanyl which immediately helped her pain.  She will had a severe bout of right upper quadrant pain, and states she never wants to have pain like that again. RUQ Ultrasound showed multiple gallstones measuring up to 14 mm.  No gallbladder wall thickening or pericholecystic fluid.  Negative sonographic Murphy sign.  Common bile duct normal 3 mm.  Liver was within the upper limits of normal for parenchymal echogenicity.  No focal hepatic lesions seen.  Portal vein patent on color Doppler with normal flow toward the liver.  Impression was cholelithiasis without associated sonographic findings to suggest acute cholecystitis.   She has been eating a very low fate diet and has no further pain episodes.  Laboratory studies: 10/16/2019: CBC- WBC 13.9, Hgb 13.3,  lipase 36, K 3.0, Na 136, AST 28, ALT 21, Alk phos 56, T bili 0.7.  Pregnancy test neg  Essential  HTN: Patient presents on amlodipine 10 mg daily and HCTZ 25 mg daily.  BP Readings from Last 3 Encounters:  10/30/19 124/80  10/27/19 126/88  10/17/19 117/79     She also has seen her OB/GYN, Dr. Amalia Hailey regarding pregnancy safe hypertension medications. He recommends eventual switch to  labetalol. Also, that she needs to stay on oral iron supplement plus her prenatals. She is using condoms now for birth control.  She may continue with her Wellbutrin during pregnancy.   Past Medical History:  Diagnosis Date  . Depression   . Depression, major, single episode, complete remission ([place]) 04/14/2019  . Hypertension   . IDA (iron deficiency anemia) 04/14/2019  . Obesity (BMI 30.0-34.9) 04/14/2019  . UTI (urinary tract infection)     Past Surgical History:  Procedure Laterality Date  . TONSILLECTOMY  age 21    Family History  Problem Relation Age of Onset  . Hypertension Mother   . Depression Mother   . Hypertension Maternal Grandmother   . Arthritis Maternal Grandmother   . Diabetes Paternal Grandmother   . Hypertension Paternal Grandmother   . Hyperlipidemia Paternal Grandmother   . Hypertension Paternal Grandfather     Social History   Socioeconomic History  . Marital status: Married    Spouse name: Not on file  . Number of children: Not on file  . Years of education: Not on file  . Highest education level: Not on  file  Occupational History  . Not on file  Tobacco Use  . Smoking status: Never Smoker  . Smokeless tobacco: Never Used  Substance and Sexual Activity  . Alcohol use: Yes    Comment: occassioanlly  . Drug use: No  . Sexual activity: Yes  Other Topics Concern  . Not on file  Social History Narrative  . Not on file   Social Determinants of Health   Financial Resource Strain:   . Difficulty of Paying Living Expenses: Not on file  Food Insecurity:   . Worried About Charity fundraiser in the Last Year: Not on file  . Ran  Out of Food in the Last Year: Not on file  Transportation Needs:   . Lack of Transportation (Medical): Not on file  . Lack of Transportation (Non-Medical): Not on file  Physical Activity:   . Days of Exercise per Week: Not on file  . Minutes of Exercise per Session: Not on file  Stress:   . Feeling of Stress : Not on file  Social Connections:   . Frequency of Communication with Friends and Family: Not on file  . Frequency of Social Gatherings with Friends and Family: Not on file  . Attends Religious Services: Not on file  . Active Member of Clubs or Organizations: Not on file  . Attends Archivist Meetings: Not on file  . Marital Status: Not on file  Intimate Partner Violence:   . Fear of Current or Ex-Partner: Not on file  . Emotionally Abused: Not on file  . Physically Abused: Not on file  . Sexually Abused: Not on file    Outpatient Medications Prior to Visit  Medication Sig Dispense Refill  . amLODipine (NORVASC) 10 MG tablet Take 1 tablet (10 mg total) by mouth daily. 30 tablet 3  . buPROPion (WELLBUTRIN XL) 300 MG 24 hr tablet TAKE 1 TABLET BY MOUTH EVERY DAY 90 tablet 2  . ferrous sulfate 325 (65 FE) MG tablet Take 325 mg by mouth daily with breakfast.    . hydrochlorothiazide (HYDRODIURIL) 25 MG tablet TAKE 1 TABLET BY MOUTH EVERY DAY 90 tablet 1  . HYDROcodone-acetaminophen (NORCO/VICODIN) 5-325 MG tablet Take 2 tablets by mouth every 6 (six) hours as needed for moderate pain or severe pain. (Patient not taking: Reported on 10/30/2019) 16 tablet 0  . ondansetron (ZOFRAN ODT) 4 MG disintegrating tablet Allow 1-2 tablets to dissolve in your mouth every 8 hours as needed for nausea/vomiting (Patient not taking: Reported on 10/30/2019) 30 tablet 0   No facility-administered medications prior to visit.    No Known Allergies  Review of Systems  Constitutional: Negative for chills, fatigue, fever and unexpected weight change.  HENT: Negative.   Respiratory:  Negative.   Cardiovascular: Negative.   Gastrointestinal: Positive for abdominal pain and nausea. Negative for blood in stool, constipation, diarrhea and vomiting.  Endocrine: Negative.   Genitourinary: Negative for difficulty urinating.  Musculoskeletal: Negative.   Skin: Negative.       Objective:    Physical Exam Vitals reviewed.  Eyes:     Conjunctiva/sclera: Conjunctivae normal.     Pupils: Pupils are equal, round, and reactive to light.  Cardiovascular:     Rate and Rhythm: Normal rate and regular rhythm.     Pulses: Normal pulses.     Heart sounds: Normal heart sounds.  Pulmonary:     Effort: Pulmonary effort is normal.     Breath sounds: Normal breath sounds.  Abdominal:  Palpations: Abdomen is soft.     Tenderness: There is no abdominal tenderness.  Musculoskeletal:        General: Normal range of motion.     Cervical back: Normal range of motion and neck supple.  Skin:    General: Skin is warm and dry.  Neurological:     General: No focal deficit present.     Mental Status: She is alert and oriented to person, place, and time.  Psychiatric:        Mood and Affect: Mood normal.        Behavior: Behavior normal.        Thought Content: Thought content normal.        Judgment: Judgment normal.     BP 124/80 (BP Location: Left Arm, Patient Position: Sitting, Cuff Size: Normal)   Pulse (!) 101   Temp 98.8 F (37.1 C) (Oral)   Ht _0  (1.803 m)   Wt 252 lb (114.3 kg)   LMP 10/05/2019 (Exact Date)   SpO2 98%   BMI 35.15 kg/m  Wt Readings from Last 3 Encounters:  10/30/19 252 lb (114.3 kg)  10/27/19 254 lb 11.2 oz (115.5 kg)  10/16/19 245 lb (111.1 kg)   Pulse Readings from Last 3 Encounters:  10/30/19 (!) 101  10/27/19 89  10/17/19 87    BP Readings from Last 3 Encounters:  10/30/19 124/80  10/27/19 126/88  10/17/19 117/79    Lab Results  Component Value Date   CHOL 149 04/14/2019   HDL 58.30 04/14/2019   LDLCALC 71 04/14/2019   TRIG  98.0 04/14/2019   CHOLHDL 3 04/14/2019      Health Maintenance Due  Topic Date Due  . Hepatitis C Screening  Never done  . HIV Screening  Never done  . TETANUS/TDAP  06/15/2018    There are no preventive care reminders to display for this patient.  Lab Results  Component Value Date   TSH 1.95 04/14/2019   Lab Results  Component Value Date   WBC 8.0 10/30/2019   HGB 14.1 10/30/2019   HCT 41.2 10/30/2019   MCV 91.2 10/30/2019   PLT 323 10/30/2019   Lab Results  Component Value Date   NA 138 10/30/2019   K 3.6 10/30/2019   CO2 25 10/30/2019   GLUCOSE 89 10/30/2019   BUN 10 10/30/2019   CREATININE 0.83 10/30/2019   BILITOT 0.6 10/30/2019   ALKPHOS 56 10/16/2019   AST 14 10/30/2019   ALT 15 10/30/2019   PROT 7.9 10/30/2019   ALBUMIN 4.5 10/16/2019   CALCIUM 10.0 10/30/2019   ANIONGAP 13 10/16/2019   GFR 109.06 04/14/2019   Lab Results  Component Value Date   CHOL 149 04/14/2019   Lab Results  Component Value Date   HDL 58.30 04/14/2019   Lab Results  Component Value Date   LDLCALC 71 04/14/2019   Lab Results  Component Value Date   TRIG 98.0 04/14/2019   Lab Results  Component Value Date   CHOLHDL 3 04/14/2019   Lab Results  Component Value Date   HGBA1C 5.1 04/14/2019      Assessment & Plan:   Problem List Items Addressed This Visit      Cardiovascular and Mediastinum   Essential hypertension - Primary     Digestive   Gallstones     Other   IDA (iron deficiency anemia)   Relevant Orders   B12 and Folate Panel (Completed)   CBC with Differential/Platelet (Completed)  Hypokalemia   Relevant Orders   Comp Met (CMET) (Completed)      No orders of the defined types were placed in this encounter. Advised:   Would not change any of her BP meds now before surgery and even during post op time. Her BP is stable and must remain so for possible upcoming cholecystectomy. There is no rush to switch her over to labetalol 100 mg BID now. No  immediate plans for pregnancy.   Continue with your plans to talk about gallbladder surgery before pregnancy with the surgeon on Monday.  Continue with the low fat diet you are consuming.   Please call back with your surgery plans .  Labs today and will call with results. Continue with the iron supplements as recommended.   Follow-up: Return TBD.   This visit occurred during the SARS-CoV-2 public health emergency.  Safety protocols were in place, including screening questions prior to the visit, additional usage of staff PPE, and extensive cleaning of exam room while observing appropriate contact time as indicated for disinfecting solutions.    Denice Paradise, NP

## 2019-10-31 LAB — COMPREHENSIVE METABOLIC PANEL
AG Ratio: 1.4 (calc) (ref 1.0–2.5)
ALT: 15 U/L (ref 6–29)
AST: 14 U/L (ref 10–30)
Albumin: 4.6 g/dL (ref 3.6–5.1)
Alkaline phosphatase (APISO): 54 U/L (ref 31–125)
BUN: 10 mg/dL (ref 7–25)
CO2: 25 mmol/L (ref 20–32)
Calcium: 10 mg/dL (ref 8.6–10.2)
Chloride: 101 mmol/L (ref 98–110)
Creat: 0.83 mg/dL (ref 0.50–1.10)
Globulin: 3.3 g/dL (calc) (ref 1.9–3.7)
Glucose, Bld: 89 mg/dL (ref 65–99)
Potassium: 3.6 mmol/L (ref 3.5–5.3)
Sodium: 138 mmol/L (ref 135–146)
Total Bilirubin: 0.6 mg/dL (ref 0.2–1.2)
Total Protein: 7.9 g/dL (ref 6.1–8.1)

## 2019-10-31 LAB — B12 AND FOLATE PANEL
Folate: 16 ng/mL
Vitamin B-12: 381 pg/mL (ref 200–1100)

## 2019-10-31 LAB — CBC WITH DIFFERENTIAL/PLATELET
Absolute Monocytes: 608 cells/uL (ref 200–950)
Basophils Absolute: 32 cells/uL (ref 0–200)
Basophils Relative: 0.4 %
Eosinophils Absolute: 64 cells/uL (ref 15–500)
Eosinophils Relative: 0.8 %
HCT: 41.2 % (ref 35.0–45.0)
Hemoglobin: 14.1 g/dL (ref 11.7–15.5)
Lymphs Abs: 2416 cells/uL (ref 850–3900)
MCH: 31.2 pg (ref 27.0–33.0)
MCHC: 34.2 g/dL (ref 32.0–36.0)
MCV: 91.2 fL (ref 80.0–100.0)
MPV: 10.4 fL (ref 7.5–12.5)
Monocytes Relative: 7.6 %
Neutro Abs: 4880 cells/uL (ref 1500–7800)
Neutrophils Relative %: 61 %
Platelets: 323 10*3/uL (ref 140–400)
RBC: 4.52 10*6/uL (ref 3.80–5.10)
RDW: 12.1 % (ref 11.0–15.0)
Total Lymphocyte: 30.2 %
WBC: 8 10*3/uL (ref 3.8–10.8)

## 2019-11-02 ENCOUNTER — Ambulatory Visit: Payer: Managed Care, Other (non HMO) | Admitting: Surgery

## 2019-11-02 ENCOUNTER — Encounter: Payer: Self-pay | Admitting: Surgery

## 2019-11-02 ENCOUNTER — Other Ambulatory Visit: Payer: Self-pay

## 2019-11-02 VITALS — BP 131/87 | HR 84 | Temp 98.5°F | Ht 71.0 in | Wt 250.6 lb

## 2019-11-02 DIAGNOSIS — K429 Umbilical hernia without obstruction or gangrene: Secondary | ICD-10-CM | POA: Diagnosis not present

## 2019-11-02 DIAGNOSIS — K8020 Calculus of gallbladder without cholecystitis without obstruction: Secondary | ICD-10-CM

## 2019-11-02 NOTE — H&P (View-Only) (Signed)
11/02/2019  Reason for Visit:  Symptomatic cholelithiasis  History of Present Illness: Samantha Glass is a 29 y.o. female presenting for evaluation of cholelithiasis.  Patient presented to the ED on 10/16 with a severe epigastric and RUQ pain episode, associated with nausea but no emesis.  She felt very pale and dizzy and her husband took her to the ER.  Her pain resolved in a couple of hours in the ER.  She had an U/S which showed multiple gallstones, without evidence of cholecystitis.  She was discharged home.  She reports that now thinking back, she's had two milder episodes of the same in the past, that she thought were just indigestion, and only lasted about half hour each.    Of note, she had a CT scan of abdomen/pelvis in 07/2019 because of lower abdominal pain but did not show any acute findings.  The patient is trying to get pregnant this year and is wondering how surgery and her gallbladder would affect things.  Past Medical History: Past Medical History:  Diagnosis Date   Depression    Depression, major, single episode, 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   Hypertension    IDA (iron deficiency anemia) 04/14/2019   Obesity (BMI 30.0-34.9) 04/14/2019   UTI (urinary tract infection)      Past Surgical History: Past Surgical History:  Procedure Laterality Date   TONSILLECTOMY  age 2    Home Medications: Prior to Admission medications   Medication Sig Start Date End Date Taking? Authorizing Provider  amLODipine (NORVASC) 10 MG tablet Take 1 tablet (10 mg total) by mouth daily. 09/08/19  Yes Theadore Nan, NP  buPROPion (WELLBUTRIN XL) 300 MG 24 hr tablet TAKE 1 TABLET BY MOUTH EVERY DAY 05/07/19  Yes Amedeo Kinsman A, NP  ferrous sulfate 325 (65 FE) MG tablet Take 325 mg by mouth daily with breakfast.   Yes [provider]  hydrochlorothiazide (HYDRODIURIL) 25 MG tablet TAKE 1 TABLET BY MOUTH EVERY DAY 07/07/19  Yes Theadore Nan, NP   HYDROcodone-acetaminophen (NORCO/VICODIN) 5-325 MG tablet Take 2 tablets by mouth every 6 (six) hours as needed for moderate pain or severe pain. Patient not taking: Reported on 10/30/2019 10/17/19   Loleta Rose, MD    Allergies: No Known Allergies  Social History:  reports that she has never smoked. She has never used smokeless tobacco. She reports current alcohol use. She reports that she does not use drugs.   Family History: Family History  Problem Relation Age of Onset   Hypertension Mother    Depression Mother    Hypertension Maternal Grandmother    Arthritis Maternal Grandmother    Diabetes Paternal Grandmother    Hypertension Paternal Grandmother    Hyperlipidemia Paternal Grandmother    Hypertension Paternal Grandfather     Review of Systems: Review of Systems  Constitutional: Negative for chills and fever.  HENT: Negative for hearing loss.   Respiratory: Negative for shortness of breath.   Cardiovascular: Negative for chest pain.  Gastrointestinal: Positive for abdominal pain and nausea. Negative for vomiting.  Genitourinary: Negative for dysuria.  Musculoskeletal: Negative for myalgias.  Skin: Negative for rash.  Neurological: Positive for dizziness.  Psychiatric/Behavioral: Negative for depression.    Physical Exam BP 131/87    Pulse 84    Temp 98.5 F (36.9 C) (Oral)    Ht 5\' 11"  (1.803 m)    Wt 250 lb 9.6 oz (113.7 kg)    LMP 10/05/2019 (Exact Date)    SpO2 94%  BMI 34.95 kg/m  CONSTITUTIONAL: No acute distress HEENT:  Normocephalic, atraumatic, extraocular motion intact. NECK: Trachea is midline, and there is no jugular venous distension.  RESPIRATORY:  Lungs are clear, and breath sounds are equal bilaterally. Normal respiratory effort without pathologic use of accessory muscles. CARDIOVASCULAR: Heart is regular without murmurs, gallops, or rubs. GI: The abdomen is soft, obese, non-distended, non-tender to palpation.  Negative Murphy's sign.  Patient has a very small <1 cm umbilical hernia. MUSCULOSKELETAL:  Normal muscle strength and tone in all four extremities.  No peripheral edema or cyanosis. SKIN: Skin turgor is normal. There are no pathologic skin lesions.  NEUROLOGIC:  Motor and sensation is grossly normal.  Cranial nerves are grossly intact. PSYCH:  Alert and oriented to person, place and time. Affect is normal.  Laboratory Analysis: Labs from 10/30/19: Na 138, K 3.6, Cl 101, CO2 25, BUN 10, Cr 0.83.  Total bilirubin 0.6, AST 14, ALT 15.  WBC 8.0, Hgb 14.1, Hct 41.2, Plt 323  Imaging: U/S 10/17/19: IMPRESSION: Cholelithiasis, without associated sonographic findings to suggest acute cholecystitis.   Assessment and Plan: This is a 29 y.o. female with biliary colic and small umbilical hernia.  --Discussed with the patient that given her previous significant episode, and prior milder episodes in the past, I would recommend proceeding with cholecystectomy.  Also, with her history, and if she is wanting to get pregnant, she could have further biliary colic issues or possibly cholecystitis.  Discussed with her the role for a robotic approach, the benefits of such, and the risks of surgery including risks of bleeding, infection, and injury to surrounding structure.  Discussed with her that we would inject ICG preop to better visualize the biliary anatomy intraoperatively.  We would also repair the umbilical hernia in the same setting. --Patient will discuss with her job and husband about timing, but discussed with her that we could schedule her as early as 11/11.  She will call us to confirm dates.  She understands she would need COVID-19 testing prior to surgery.  Face-to-face time spent with the patient and care providers was 45 minutes, with more than 50% of the time spent counseling, educating, and coordinating care of the patient.     Howie Ill, MD  [facility]

## 2019-11-02 NOTE — Progress Notes (Signed)
°11/02/2019 ° °Reason for Visit:  Symptomatic cholelithiasis ° °History of Present Illness: °Samantha Glass is a 29 y.o. female presenting for evaluation of cholelithiasis.  Patient presented to the ED on 10/16 with a severe epigastric and RUQ pain episode, associated with nausea but no emesis.  She felt very pale and dizzy and her husband took her to the ER.  Her pain resolved in a couple of hours in the ER.  She had an U/S which showed multiple gallstones, without evidence of cholecystitis.  She was discharged home.  She reports that now thinking back, she's had two milder episodes of the same in the past, that she thought were just indigestion, and only lasted about half hour each.   ° °Of note, she had a CT scan of abdomen/pelvis in 07/2019 because of lower abdominal pain but did not show any acute findings. ° °The patient is trying to get pregnant this year and is wondering how surgery and her gallbladder would affect things. ° °Past Medical History: °Past Medical History:  °Diagnosis Date  °• Depression   °• Depression, major, single episode, 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  °• Hypertension   °• IDA (iron deficiency anemia) 04/14/2019  °• Obesity (BMI 30.0-34.9) 04/14/2019  °• UTI (urinary tract infection)   °  ° °Past Surgical History: °Past Surgical History:  °Procedure Laterality Date  °• TONSILLECTOMY  age 17  ° ° °Home Medications: °Prior to Admission medications   °Medication Sig Start Date End Date Taking? Authorizing Provider  °amLODipine (NORVASC) 10 MG tablet Take 1 tablet (10 mg total) by mouth daily. 09/08/19  Yes Mills, Kimberly A, NP  °buPROPion (WELLBUTRIN XL) 300 MG 24 hr tablet TAKE 1 TABLET BY MOUTH EVERY DAY 05/07/19  Yes Mills, Kimberly A, NP  °ferrous sulfate 325 (65 FE) MG tablet Take 325 mg by mouth daily with breakfast.   Yes [provider]  °hydrochlorothiazide (HYDRODIURIL) 25 MG tablet TAKE 1 TABLET BY MOUTH EVERY DAY 07/07/19  Yes Mills, Kimberly A, NP   °HYDROcodone-acetaminophen (NORCO/VICODIN) 5-325 MG tablet Take 2 tablets by mouth every 6 (six) hours as needed for moderate pain or severe pain. °Patient not taking: Reported on 10/30/2019 10/17/19   Forbach, Cory, MD  ° ° °Allergies: °No Known Allergies ° °Social History: ° reports that she has never smoked. She has never used smokeless tobacco. She reports current alcohol use. She reports that she does not use drugs.  ° °Family History: °Family History  °Problem Relation Age of Onset  °• Hypertension Mother   °• Depression Mother   °• Hypertension Maternal Grandmother   °• Arthritis Maternal Grandmother   °• Diabetes Paternal Grandmother   °• Hypertension Paternal Grandmother   °• Hyperlipidemia Paternal Grandmother   °• Hypertension Paternal Grandfather   ° ° °Review of Systems: °Review of Systems  °Constitutional: Negative for chills and fever.  °HENT: Negative for hearing loss.   °Respiratory: Negative for shortness of breath.   °Cardiovascular: Negative for chest pain.  °Gastrointestinal: Positive for abdominal pain and nausea. Negative for vomiting.  °Genitourinary: Negative for dysuria.  °Musculoskeletal: Negative for myalgias.  °Skin: Negative for rash.  °Neurological: Positive for dizziness.  °Psychiatric/Behavioral: Negative for depression.  ° ° °Physical Exam °BP 131/87    Pulse 84    Temp 98.5 °F (36.9 °C) (Oral)    Ht 5' 11" (1.803 m)    Wt 250 lb 9.6 oz (113.7 kg)    LMP 10/05/2019 (Exact Date)    SpO2 94%      BMI 34.95 kg/m²  °CONSTITUTIONAL: No acute distress °HEENT:  Normocephalic, atraumatic, extraocular motion intact. °NECK: Trachea is midline, and there is no jugular venous distension.  °RESPIRATORY:  Lungs are clear, and breath sounds are equal bilaterally. Normal respiratory effort without pathologic use of accessory muscles. °CARDIOVASCULAR: Heart is regular without murmurs, gallops, or rubs. °GI: The abdomen is soft, obese, non-distended, non-tender to palpation.  Negative Murphy's sign.  Patient has a very small <1 cm umbilical hernia. °MUSCULOSKELETAL:  Normal muscle strength and tone in all four extremities.  No peripheral edema or cyanosis. °SKIN: Skin turgor is normal. There are no pathologic skin lesions.  °NEUROLOGIC:  Motor and sensation is grossly normal.  Cranial nerves are grossly intact. °PSYCH:  Alert and oriented to person, place and time. Affect is normal. ° °Laboratory Analysis: °Labs from 10/30/19: °Na 138, K 3.6, Cl 101, CO2 25, BUN 10, Cr 0.83.  Total bilirubin 0.6, AST 14, ALT 15.  WBC 8.0, Hgb 14.1, Hct 41.2, Plt 323 ° °Imaging: °U/S 10/17/19: °IMPRESSION: °Cholelithiasis, without associated sonographic findings to suggest °acute cholecystitis. °  ° °Assessment and Plan: °This is a 29 y.o. female with biliary colic and small umbilical hernia. ° °--Discussed with the patient that given her previous significant episode, and prior milder episodes in the past, I would recommend proceeding with cholecystectomy.  Also, with her history, and if she is wanting to get pregnant, she could have further biliary colic issues or possibly cholecystitis.  Discussed with her the role for a robotic approach, the benefits of such, and the risks of surgery including risks of bleeding, infection, and injury to surrounding structure.  Discussed with her that we would inject ICG preop to better visualize the biliary anatomy intraoperatively.  We would also repair the umbilical hernia in the same setting. °--Patient will discuss with her job and husband about timing, but discussed with her that we could schedule her as early as 11/11.  She will call us to confirm dates.  She understands she would need COVID-19 testing prior to surgery. ° °Face-to-face time spent with the patient and care providers was 45 minutes, with more than 50% of the time spent counseling, educating, and coordinating care of the patient.   ° ° °Dalyah Pla Luis Anias Bartol, MD  °[facility] ° ° °

## 2019-11-02 NOTE — Patient Instructions (Addendum)
Dr.Piscoya answered and discussed all questions and concerns with patient at today's visit.  He also discussed the surgery treatment, risk factors and recovery stage with patient at today's visit as well.  Cholelithiasis  Cholelithiasis is a form of gallbladder disease in which gallstones form in the gallbladder. The gallbladder is an organ that stores bile. Bile is made in the liver, and it helps to digest fats. Gallstones begin as small crystals and slowly grow into stones. They may cause no symptoms until the gallbladder tightens (contracts) and a gallstone is blocking the duct (gallbladder attack), which can cause pain. Cholelithiasis is also referred to as gallstones. There are two main types of gallstones:  Cholesterol stones. These are made of hardened cholesterol and are usually yellow-green in color. They are the most common type of gallstone. Cholesterol is a white, waxy, fat-like substance that is made in the liver.  Pigment stones. These are dark in color and are made of a red-yellow substance that forms when hemoglobin from red blood cells breaks down (bilirubin). What are the causes? This condition may be caused by an imbalance in the substances that bile is made of. This can happen if the bile:  Has too much bilirubin.  Has too much cholesterol.  Does not have enough bile salts. These salts help the body absorb and digest fats. In some cases, this condition can also be caused by the gallbladder not emptying completely or often enough. What increases the risk? The following factors may make you more likely to develop this condition:  Being female.  Having multiple pregnancies. Health care providers sometimes advise removing diseased gallbladders before future pregnancies.  Eating a diet that is heavy in fried foods, fat, and refined carbohydrates, like white bread and white rice.  Being obese.  Being older than age 8.  Prolonged use of medicines that contain female  hormones (estrogen).  Having diabetes mellitus.  Rapidly losing weight.  Having a family history of gallstones.  Being of American [place] or [place] descent.  Having an intestinal disease such as Crohn disease.  Having metabolic syndrome.  Having cirrhosis.  Having severe types of anemia such as sickle cell anemia. What are the signs or symptoms? In most cases, there are no symptoms. These are known as silent gallstones. If a gallstone blocks the bile ducts, it can cause a gallbladder attack. The main symptom of a gallbladder attack is sudden pain in the upper right abdomen. The pain usually comes at night or after eating a large meal. The pain can last for one or several hours and can spread to the right shoulder or chest. If the bile duct is blocked for more than a few hours, it can cause infection or inflammation of the gallbladder, liver, or pancreas, which may cause:  Nausea.  Vomiting.  Abdominal pain that lasts for 5 hours or more.  Fever or chills.  Yellowing of the skin or the whites of the eyes (jaundice).  Dark urine.  Light-colored stools. How is this diagnosed? This condition may be diagnosed based on:  A physical exam.  Your medical history.  An ultrasound of your gallbladder.  CT scan.  MRI.  Blood tests to check for signs of infection or inflammation.  A scan of your gallbladder and bile ducts (biliary system) using nonharmful radioactive material and special cameras that can see the radioactive material (cholescintigram). This test checks to see how your gallbladder contracts and whether bile ducts are blocked.  Inserting a small tube with a  camera on the end (endoscope) through your mouth to inspect bile ducts and check for blockages (endoscopic retrograde cholangiopancreatogram). How is this treated? Treatment for gallstones depends on the severity of the condition. Silent gallstones do not need treatment. If the gallstones cause a gallbladder  attack or other symptoms, treatment may be required. Options for treatment include:  Surgery to remove the gallbladder (cholecystectomy). This is the most common treatment.  Medicines to dissolve gallstones. These are most effective at treating small gallstones. You may need to take medicines for up to 6-12 months.  Shock wave treatment (extracorporeal biliary lithotripsy). In this treatment, an ultrasound machine sends shock waves to the gallbladder to break gallstones into smaller pieces. These pieces can then be passed into the intestines or be dissolved by medicine. This is rarely used.  Removing gallstones through endoscopic retrograde cholangiopancreatogram. A small basket can be attached to the endoscope and used to capture and remove gallstones. Follow these instructions at home:  Take over-the-counter and prescription medicines only as told by your health care provider.  Maintain a healthy weight and follow a healthy diet. This includes: ? Reducing fatty foods, such as fried food. ? Reducing refined carbohydrates, like white bread and white rice. ? Increasing fiber. Aim for foods like almonds, fruit, and beans.  Keep all follow-up visits as told by your health care provider. This is important. Contact a health care provider if:  You think you have had a gallbladder attack.  You have been diagnosed with silent gallstones and you develop abdominal pain or indigestion. Get help right away if:  You have pain from a gallbladder attack that lasts for more than 2 hours.  You have abdominal pain that lasts for more than 5 hours.  You have a fever or chills.  You have persistent nausea and vomiting.  You develop jaundice.  You have dark urine or light-colored stools. Summary  Cholelithiasis (also called gallstones) is a form of gallbladder disease in which gallstones form in the gallbladder.  This condition is caused by an imbalance in the substances that make up bile. This  can happen if the bile has too much cholesterol, too much bilirubin, or not enough bile salts.  You are more likely to develop this condition if you are female, pregnant, using medicines with estrogen, obese, older than age 26, or have a family history of gallstones. You may also develop gallstones if you have diabetes, an intestinal disease, cirrhosis, or metabolic syndrome.  Treatment for gallstones depends on the severity of the condition. Silent gallstones do not need treatment.  If gallstones cause a gallbladder attack or other symptoms, treatment may be needed. The most common treatment is surgery to remove the gallbladder. This information is not intended to replace advice given to you by your health care provider. Make sure you discuss any questions you have with your health care provider. Document Revised: 11/30/2016 Document Reviewed: 09/04/2015 Elsevier Patient Education  [place] If you have a gallbladder condition, you may have trouble digesting fats. Eating a low-fat diet can help reduce your symptoms, and may be helpful before and after having surgery to remove your gallbladder (cholecystectomy). Your health care provider may recommend that you work with a diet and nutrition specialist (dietitian) to help you reduce the amount of fat in your diet. What are tips for following this plan? General guidelines  Limit your fat intake to less than 30% of your total daily calories. If you eat around 1,800  calories each day, this is less than 60 grams (g) of fat per day.  Fat is an important part of a healthy diet. Eating a low-fat diet can make it hard to maintain a healthy body weight. Ask your dietitian how much fat, calories, and other nutrients you need each day.  Eat small, frequent meals throughout the day instead of three large meals.  Drink at least 8-10 cups of fluid a day. Drink enough fluid to keep your urine clear or pale yellow.  Limit  alcohol intake to no more than 1 drink a day for nonpregnant women and 2 drinks a day for men. One drink equals 12 oz of beer, 5 oz of wine, or 1 oz of hard liquor. Reading food labels  Check Nutrition Facts on food labels for the amount of fat per serving. Choose foods with less than 3 grams of fat per serving. Shopping  Choose nonfat and low-fat healthy foods. Look for the words "nonfat," "low fat," or "fat free."  Avoid buying processed or prepackaged foods. Cooking  Cook using low-fat methods, such as baking, broiling, grilling, or boiling.  Cook with small amounts of healthy fats, such as olive oil, grapeseed oil, canola oil, or sunflower oil. What foods are recommended?   All fresh, frozen, or canned fruits and vegetables.  Whole grains.  Low-fat or non-fat (skim) milk and yogurt.  Lean meat, skinless poultry, fish, eggs, and beans.  Low-fat protein supplement powders or drinks.  Spices and herbs. What foods are not recommended?  High-fat foods. These include baked goods, fast food, fatty cuts of meat, ice cream, french toast, sweet rolls, pizza, cheese bread, foods covered with butter, creamy sauces, or cheese.  Fried foods. These include french fries, tempura, battered fish, breaded chicken, fried breads, and sweets.  Foods with strong odors.  Foods that cause bloating and gas. Summary  A low-fat diet can be helpful if you have a gallbladder condition, or before and after gallbladder surgery.  Limit your fat intake to less than 30% of your total daily calories. This is about 60 g of fat if you eat 1,800 calories each day.  Eat small, frequent meals throughout the day instead of three large meals. This information is not intended to replace advice given to you by your health care provider. Make sure you discuss any questions you have with your health care provider. Document Revised: 04/10/2018 Document Reviewed: 01/26/2016 Elsevier Patient Education  2020  [employer].

## 2019-11-03 ENCOUNTER — Telehealth: Payer: Self-pay | Admitting: Surgery

## 2019-11-03 NOTE — Telephone Encounter (Signed)
Patient has been advised of Pre-Admission date/time, COVID Testing date and Surgery date.  Surgery Date: 11/24/19 Preadmission Testing Date: 11/16/19 (phone 1p-5p) Covid Testing Date: 11/20/19 - patient [facility] ([address] [facility]) between 8a-1p   Patient has been made aware to call [phone], between 1-3:00pm the day before surgery, to find out what time to arrive for surgery.

## 2019-11-04 ENCOUNTER — Encounter (INDEPENDENT_AMBULATORY_CARE_PROVIDER_SITE_OTHER): Payer: Self-pay

## 2019-11-16 ENCOUNTER — Other Ambulatory Visit: Payer: Self-pay

## 2019-11-16 ENCOUNTER — Encounter
Admission: RE | Admit: 2019-11-16 | Discharge: 2019-11-16 | Disposition: A | Discharge status: Home / Self Care | Payer: Managed Care, Other (non HMO) | Source: Ambulatory Visit | Attending: Surgery | Admitting: Surgery

## 2019-11-16 HISTORY — DX: Inflammatory liver disease, unspecified: K75.9

## 2019-11-16 NOTE — Patient Instructions (Addendum)
Your procedure is scheduled on: Tuesday, November 23 Report to the Registration Desk on the 1st floor of the [facility] To find out your arrival time, please call [phone] between 1PM - 3PM on: Monday, November 22  REMEMBER: Instructions that are not followed completely may result in serious medical risk, up to and including death; or upon the discretion of your surgeon and anesthesiologist your surgery may need to be rescheduled.  Do not eat food after midnight the night before surgery.  No gum chewing, lozengers or hard candies.  You may however, drink CLEAR liquids up to 2 hours before you are scheduled to arrive for your surgery. Do not drink anything within 2 hours of your scheduled arrival time.  Clear liquids include: - water  - apple juice without pulp - gatorade (not RED, PURPLE, OR BLUE) - black coffee or tea (Do NOT add milk or creamers to the coffee or tea) Do NOT drink anything that is not on this list.  TAKE THESE MEDICATIONS THE MORNING OF SURGERY WITH A SIP OF WATER:  1.  Bupropion 3.  Hydrocodone if needed for pain  One week prior to surgery: Stop Anti-inflammatories (NSAIDS) such as Advil, Aleve, Ibuprofen, Motrin, Naproxen, Naprosyn and Aspirin based products such as Excedrin, Goodys Powder, BC Powder. Stop ANY OVER THE COUNTER supplements until after surgery. (vitamin C)  No Alcohol for 24 hours before or after surgery.  Do not use any "recreational" drugs for at least a week prior to your surgery.  Please be advised that the combination of cocaine and anesthesia may have negative outcomes, up to and including death. If you test positive for cocaine, your surgery will be cancelled.  On the morning of surgery brush your teeth with toothpaste and water, you may rinse your mouth with mouthwash if you wish. Do not swallow any toothpaste or mouthwash.  Do not wear jewelry, make-up, hairpins, clips or nail polish.  Do not wear lotions, powders, or  perfumes.   Do not shave body from the neck down 48 hours prior to surgery just in case you cut yourself which could leave a site for infection.  Also, freshly shaved skin may become irritated if using the CHG soap.  Do not bring valuables to the hospital. [facility] is not responsible for any missing/lost belongings or valuables.   Use CHG Soap as directed on instruction sheet.  Notify your doctor if there is any change in your medical condition (cold, fever, infection).  Wear comfortable clothing (specific to your surgery type) to the hospital.  Plan for stool softeners for home use; pain medications have a tendency to cause constipation. You can also help prevent constipation by eating foods high in fiber such as fruits and vegetables and drinking plenty of fluids as your diet allows.  After surgery, you can help prevent lung complications by doing breathing exercises.  Take deep breaths and cough every 1-2 hours. Your doctor may order a device called an Incentive Spirometer to help you take deep breaths. When coughing or sneezing, hold a pillow firmly against your incision with both hands. This is called "splinting." Doing this helps protect your incision. It also decreases belly discomfort.  If you are being discharged the day of surgery, you will not be allowed to drive home. You will need a responsible adult (18 years or older) to drive you home and stay with you that night.   If you are taking public transportation, you will need to have a responsible  adult (18 years or older) with you. Please confirm with your physician that it is acceptable to use public transportation.   Please call the [facility] [place] [facility] at [phone] if you have any questions about these instructions.  Visitation Policy:  Patients undergoing a surgery or procedure may have one family member or support person with them as long as that person is not COVID-19 positive or experiencing its  symptoms.  That person may remain in the waiting area during the procedure.

## 2019-11-18 ENCOUNTER — Encounter: Payer: Self-pay | Admitting: Obstetrics and Gynecology

## 2019-11-18 ENCOUNTER — Ambulatory Visit (INDEPENDENT_AMBULATORY_CARE_PROVIDER_SITE_OTHER): Payer: Managed Care, Other (non HMO) | Admitting: Obstetrics and Gynecology

## 2019-11-18 ENCOUNTER — Other Ambulatory Visit: Payer: Self-pay

## 2019-11-18 VITALS — BP 126/84 | Ht 71.0 in | Wt 250.0 lb

## 2019-11-18 DIAGNOSIS — Z3169 Encounter for other general counseling and advice on procreation: Secondary | ICD-10-CM

## 2019-11-18 NOTE — Progress Notes (Signed)
Obstetrics & Gynecology Office Visit   Chief Complaint  Patient presents with  . Referral    pt wants to esablish care before getting pregnant    History of Present Illness: 29 y.o. G0P0 female who presents to establish care as she plans to get pregnant in the next year or so.    She is correctly not on contraception. She stopped in July, she was taking ortho tri-cyclen lo.  Since stopping this medication her periods come every 26-27 days, regular, lasting 4-5 days. There is some mild dysmenorrhea.  She denies intermenstrual bleeding.  She is up to date on her pap smear. Her most recent one was in June this year and was normal.   She has been having issues with her gall bladder.  She is scheduled to have her gall bladder removed next week by Dr. Aleen Campi.   She has a history of hypertension.  She is on two medications, HCTZ and amlodipine.  She does not like taking amlodipine.  She has a blood pressure cuff at home.  The other medication that she is on is bupropion for depression. She has been on this medication.   She has a low level of iron.  She has taken Vitron C.     She does not smoke or do drugs.  She uses no EtOH since being in the ER. Prior to this, she would have a 2-3 drinks per week.    Past Medical History:  Diagnosis Date  . Depression   . 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 Hepatitis    middle school age  . Hypertension   . IDA (iron deficiency anemia) 04/14/2019  . Obesity (BMI 30.0-34.9) 04/14/2019  . UTI (urinary tract infection)    Past Surgical History:  Procedure Laterality Date  . TONSILLECTOMY  age 89   Gynecologic History: Patient's last menstrual period was 11/02/2019 (approximate).  Obstetric History: G0P0  Family History  Problem Relation Age of Onset  . Hypertension Mother   . Depression Mother   . Hypertension Maternal Grandmother   . Arthritis Maternal Grandmother   . Diabetes Paternal Grandmother   .  Hypertension Paternal Grandmother   . Hyperlipidemia Paternal Grandmother   . Hypertension Paternal Grandfather     Social History   Socioeconomic History  . Marital status: Married    Spouse name: Not on file  . Number of children: Not on file  . Years of education: Not on file  . Highest education level: Not on file  Occupational History  . Not on file  Tobacco Use  . Smoking status: Never Smoker  . Smokeless tobacco: Never Used  Vaping Use  . Vaping Use: Never used  Substance and Sexual Activity  . Alcohol use: Yes    Comment: occassional  . Drug use: No  . Sexual activity: Yes  Other Topics Concern  . Not on file  Social History Narrative  . Not on file   Social Determinants of Health   Financial Resource Strain:   . Difficulty of Paying Living Expenses: Not on file  Food Insecurity:   . Worried About Programme researcher, broadcasting/film/video in the Last Year: Not on file  . Ran Out of Food in the Last Year: Not on file  Transportation Needs:   . Lack of Transportation (Medical): Not on file  . Lack of Transportation (Non-Medical): Not on file  Physical Activity:   . Days of Exercise per Week: Not on file  .  Minutes of Exercise per Session: Not on file  Stress:   . Feeling of Stress : Not on file  Social Connections:   . Frequency of Communication with Friends and Family: Not on file  . Frequency of Social Gatherings with Friends and Family: Not on file  . Attends Religious Services: Not on file  . Active Member of Clubs or Organizations: Not on file  . Attends Banker Meetings: Not on file  . Marital Status: Not on file  Intimate Partner Violence:   . Fear of Current or Ex-Partner: Not on file  . Emotionally Abused: Not on file  . Physically Abused: Not on file  . Sexually Abused: Not on file   Allergies: No Known Allergies  Prior to Admission medications   Medication Sig Start Date End Date Taking? Authorizing Provider  amLODipine (NORVASC) 10 MG tablet Take  1 tablet (10 mg total) by mouth daily. Patient taking differently: Take 10 mg by mouth at bedtime.  09/08/19   Theadore Nan, NP  buPROPion (WELLBUTRIN XL) 300 MG 24 hr tablet TAKE 1 TABLET BY MOUTH EVERY DAY Patient taking differently: Take 300 mg by mouth daily.  05/07/19   Theadore Nan, NP  hydrochlorothiazide (HYDRODIURIL) 25 MG tablet TAKE 1 TABLET BY MOUTH EVERY DAY Patient taking differently: Take 25 mg by mouth daily.  07/07/19   Theadore Nan, NP  HYDROcodone-acetaminophen (NORCO/VICODIN) 5-325 MG tablet Take 2 tablets by mouth every 6 (six) hours as needed for moderate pain or severe pain. 10/17/19   Loleta Rose, MD  Iron-Vitamin C (VITRON-C) 65-125 MG TABS Take 1 tablet by mouth at bedtime.     [provider]  Probiotic Product (PROBIOTIC PO) Take 1 capsule by mouth daily.    [provider]    Review of Systems  Constitutional: Negative.   HENT: Negative.   Eyes: Negative.   Respiratory: Negative.   Cardiovascular: Negative.   Gastrointestinal: Negative.   Genitourinary: Negative.   Musculoskeletal: Negative.   Skin: Negative.   Neurological: Negative.   Psychiatric/Behavioral: Negative.      Physical Exam BP 126/84   Ht 5\' 11"  (1.803 m)   Wt 250 lb (113.4 kg)   LMP 11/02/2019 (Approximate)   BMI 34.87 kg/m  Patient's last menstrual period was 11/02/2019 (approximate). Physical Exam Constitutional:      General: She is not in acute distress.    Appearance: Normal appearance.  HENT:     Head: Normocephalic and atraumatic.  Eyes:     General: No scleral icterus.    Conjunctiva/sclera: Conjunctivae normal.  Neurological:     General: No focal deficit present.     Mental Status: She is alert and oriented to person, place, and time.     Cranial Nerves: No cranial nerve deficit.  Psychiatric:        Mood and Affect: Mood normal.        Behavior: Behavior normal.        Judgment: Judgment normal.   Female chaperone present for pelvic  and breast  portions of the physical exam  Assessment: 29 y.o. G0P0 female here for  1. Pre-conception counseling      Plan: Problem List Items Addressed This Visit    None    Visit Diagnoses    Pre-conception counseling    -  Primary     Depression: OK to continue Wellbutrin.  Benign essential hypertension: I would recommend considering stopping HCTZ first and, if needed, start labetalol.  Once titrated, decrease dose of amlodipine to 5mg  and increase labetalol, as needed.  May then consider stopping amlodipine and either increasing labetalol or adding nifedipine XL and titrate to response.  We discussed that she will likely be able to come off one or more of these agents once pregnant. We discussed physiologic blood pressure changes in pregnancy and more of a permissive hypertension approach in pregnancy.  Discussed some basic differences in prenatal care in pregnancy and the potential effects of hypertension on pregnancy.  Iron deficiency anemia: Will have to monitor closely in pregnancy.  Her last indicators were reassuring, in terms of iron status.    I would await until after she completes and recovers from her cholecystectomy before making changes.  Discussed considering some weight loss prior to pregnancy and possible benefits.  Take PNV while not on birth control.  A total of 42 minutes were spent face-to-face with the patient as well as preparation, review, communication, and documentation during this encounter.    , MD 11/18/2019 1:59 PM

## 2019-11-20 ENCOUNTER — Other Ambulatory Visit: Payer: Self-pay

## 2019-11-20 ENCOUNTER — Other Ambulatory Visit
Admission: RE | Admit: 2019-11-20 | Discharge: 2019-11-20 | Disposition: A | Discharge status: Home / Self Care | Payer: Managed Care, Other (non HMO) | Source: Ambulatory Visit | Attending: Surgery | Admitting: Surgery

## 2019-11-20 DIAGNOSIS — Z20822 Contact with and (suspected) exposure to covid-19: Secondary | ICD-10-CM | POA: Diagnosis not present

## 2019-11-20 DIAGNOSIS — Z01812 Encounter for preprocedural laboratory examination: Secondary | ICD-10-CM | POA: Insufficient documentation

## 2019-11-21 LAB — SARS CORONAVIRUS 2 (TAT 6-24 HRS): SARS Coronavirus 2: NEGATIVE

## 2019-11-23 ENCOUNTER — Encounter: Payer: Self-pay | Admitting: Obstetrics and Gynecology

## 2019-11-23 MED ORDER — INDOCYANINE GREEN 25 MG IV SOLR
2.5000 mg | INTRAVENOUS | Status: AC
  Administered 2019-11-24: 2.5 mg via INTRAVENOUS
  Filled 2019-11-23: qty 1

## 2019-11-24 ENCOUNTER — Encounter: Payer: Self-pay | Admitting: Obstetrics and Gynecology

## 2019-11-24 ENCOUNTER — Ambulatory Visit
Admission: RE | Admit: 2019-11-24 | Discharge: 2019-11-24 | Disposition: A | Discharge status: Home / Self Care | Payer: Managed Care, Other (non HMO) | Attending: Surgery | Admitting: Surgery

## 2019-11-24 ENCOUNTER — Ambulatory Visit: Payer: Managed Care, Other (non HMO) | Admitting: Anesthesiology

## 2019-11-24 ENCOUNTER — Other Ambulatory Visit: Payer: Self-pay

## 2019-11-24 ENCOUNTER — Encounter: Payer: Self-pay | Admitting: Surgery

## 2019-11-24 ENCOUNTER — Encounter
Admission: RE | Disposition: A | Discharge status: Home / Self Care | Payer: Self-pay | Source: Home / Self Care | Attending: Surgery

## 2019-11-24 DIAGNOSIS — K8020 Calculus of gallbladder without cholecystitis without obstruction: Secondary | ICD-10-CM | POA: Diagnosis not present

## 2019-11-24 DIAGNOSIS — Z79899 Other long term (current) drug therapy: Secondary | ICD-10-CM | POA: Diagnosis not present

## 2019-11-24 DIAGNOSIS — K8010 Calculus of gallbladder with chronic cholecystitis without obstruction: Secondary | ICD-10-CM | POA: Diagnosis present

## 2019-11-24 DIAGNOSIS — Z8349 Family history of other endocrine, nutritional and metabolic diseases: Secondary | ICD-10-CM | POA: Insufficient documentation

## 2019-11-24 DIAGNOSIS — Z833 Family history of diabetes mellitus: Secondary | ICD-10-CM | POA: Insufficient documentation

## 2019-11-24 DIAGNOSIS — I10 Essential (primary) hypertension: Secondary | ICD-10-CM | POA: Diagnosis not present

## 2019-11-24 DIAGNOSIS — Z8261 Family history of arthritis: Secondary | ICD-10-CM | POA: Diagnosis not present

## 2019-11-24 DIAGNOSIS — E669 Obesity, unspecified: Secondary | ICD-10-CM | POA: Diagnosis not present

## 2019-11-24 DIAGNOSIS — K828 Other specified diseases of gallbladder: Secondary | ICD-10-CM | POA: Diagnosis not present

## 2019-11-24 DIAGNOSIS — Z8249 Family history of ischemic heart disease and other diseases of the circulatory system: Secondary | ICD-10-CM | POA: Insufficient documentation

## 2019-11-24 DIAGNOSIS — K429 Umbilical hernia without obstruction or gangrene: Secondary | ICD-10-CM | POA: Insufficient documentation

## 2019-11-24 DIAGNOSIS — Z6834 Body mass index (BMI) 34.0-34.9, adult: Secondary | ICD-10-CM | POA: Diagnosis not present

## 2019-11-24 HISTORY — PX: UMBILICAL HERNIA REPAIR: SHX196

## 2019-11-24 LAB — POCT PREGNANCY, URINE: Preg Test, Ur: NEGATIVE

## 2019-11-24 SURGERY — CHOLECYSTECTOMY, ROBOT-ASSISTED, LAPAROSCOPIC
Anesthesia: General

## 2019-11-24 MED ORDER — ROCURONIUM BROMIDE 100 MG/10ML IV SOLN
INTRAVENOUS | Status: DC | PRN
  Administered 2019-11-24: 10 mg via INTRAVENOUS
  Administered 2019-11-24: 20 mg via INTRAVENOUS
  Administered 2019-11-24: 50 mg via INTRAVENOUS

## 2019-11-24 MED ORDER — PHENYLEPHRINE HCL (PRESSORS) 10 MG/ML IV SOLN
INTRAVENOUS | Status: DC | PRN
  Administered 2019-11-24: 200 ug via INTRAVENOUS
  Administered 2019-11-24 (×4): 100 ug via INTRAVENOUS
  Administered 2019-11-24 (×2): 200 ug via INTRAVENOUS

## 2019-11-24 MED ORDER — PROPOFOL 10 MG/ML IV BOLUS
INTRAVENOUS | Status: AC
  Filled 2019-11-24: qty 20

## 2019-11-24 MED ORDER — OXYCODONE HCL 5 MG PO TABS
ORAL_TABLET | ORAL | Status: AC
  Filled 2019-11-24: qty 1

## 2019-11-24 MED ORDER — KETOROLAC TROMETHAMINE 30 MG/ML IJ SOLN
INTRAMUSCULAR | Status: DC | PRN
  Administered 2019-11-24: 30 mg via INTRAVENOUS

## 2019-11-24 MED ORDER — SCOPOLAMINE 1 MG/3DAYS TD PT72
1.0000 | MEDICATED_PATCH | TRANSDERMAL | Status: DC
  Administered 2019-11-24: 1.5 mg via TRANSDERMAL

## 2019-11-24 MED ORDER — ACETAMINOPHEN 500 MG PO TABS: 1000.0000 mg | ORAL_TABLET | ORAL | Status: AC

## 2019-11-24 MED ORDER — ROCURONIUM BROMIDE 10 MG/ML (PF) SYRINGE
PREFILLED_SYRINGE | INTRAVENOUS | Status: AC
  Filled 2019-11-24: qty 10

## 2019-11-24 MED ORDER — DEXAMETHASONE SODIUM PHOSPHATE 10 MG/ML IJ SOLN
INTRAMUSCULAR | Status: DC | PRN
  Administered 2019-11-24: 10 mg via INTRAVENOUS

## 2019-11-24 MED ORDER — CHLORHEXIDINE GLUCONATE CLOTH 2 % EX PADS: 6.0000 | MEDICATED_PAD | Freq: Once | CUTANEOUS | Status: DC

## 2019-11-24 MED ORDER — LIDOCAINE HCL (CARDIAC) PF 100 MG/5ML IV SOSY
PREFILLED_SYRINGE | INTRAVENOUS | Status: DC | PRN
  Administered 2019-11-24: 100 mg via INTRAVENOUS

## 2019-11-24 MED ORDER — FAMOTIDINE 20 MG PO TABS
ORAL_TABLET | ORAL | Status: AC
  Administered 2019-11-24: 20 mg via ORAL
  Filled 2019-11-24: qty 1

## 2019-11-24 MED ORDER — BUPIVACAINE-EPINEPHRINE (PF) 0.5% -1:200000 IJ SOLN
INTRAMUSCULAR | Status: DC | PRN
  Administered 2019-11-24: 30 mL

## 2019-11-24 MED ORDER — ACETAMINOPHEN 500 MG PO TABS
ORAL_TABLET | ORAL | Status: AC
  Administered 2019-11-24: 1000 mg via ORAL
  Filled 2019-11-24: qty 2

## 2019-11-24 MED ORDER — FENTANYL CITRATE (PF) 100 MCG/2ML IJ SOLN
INTRAMUSCULAR | Status: AC
  Administered 2019-11-24: 25 ug via INTRAVENOUS
  Filled 2019-11-24: qty 2

## 2019-11-24 MED ORDER — DEXMEDETOMIDINE HCL 200 MCG/2ML IV SOLN
INTRAVENOUS | Status: DC | PRN
  Administered 2019-11-24: 4 ug via INTRAVENOUS
  Administered 2019-11-24 (×2): 8 ug via INTRAVENOUS

## 2019-11-24 MED ORDER — ONDANSETRON HCL 4 MG/2ML IJ SOLN
INTRAMUSCULAR | Status: AC
  Filled 2019-11-24: qty 2

## 2019-11-24 MED ORDER — DEXAMETHASONE SODIUM PHOSPHATE 10 MG/ML IJ SOLN
INTRAMUSCULAR | Status: AC
  Filled 2019-11-24: qty 1

## 2019-11-24 MED ORDER — ONDANSETRON HCL 4 MG/2ML IJ SOLN
INTRAMUSCULAR | Status: DC | PRN
  Administered 2019-11-24: 4 mg via INTRAVENOUS

## 2019-11-24 MED ORDER — FENTANYL CITRATE (PF) 100 MCG/2ML IJ SOLN
INTRAMUSCULAR | Status: AC
  Filled 2019-11-24: qty 2

## 2019-11-24 MED ORDER — OXYCODONE HCL 5 MG PO TABS
5.0000 mg | ORAL_TABLET | Freq: Once | ORAL | Status: AC | PRN
  Administered 2019-11-24: 5 mg via ORAL

## 2019-11-24 MED ORDER — BUPIVACAINE-EPINEPHRINE (PF) 0.5% -1:200000 IJ SOLN
INTRAMUSCULAR | Status: AC
  Filled 2019-11-24: qty 30

## 2019-11-24 MED ORDER — GABAPENTIN 300 MG PO CAPS
ORAL_CAPSULE | ORAL | Status: AC
  Administered 2019-11-24: 300 mg via ORAL
  Filled 2019-11-24: qty 1

## 2019-11-24 MED ORDER — LACTATED RINGERS IV SOLN: INTRAVENOUS | Status: DC

## 2019-11-24 MED ORDER — CHLORHEXIDINE GLUCONATE 0.12 % MT SOLN
OROMUCOSAL | Status: AC
  Administered 2019-11-24: 15 mL via OROMUCOSAL
  Filled 2019-11-24: qty 15

## 2019-11-24 MED ORDER — ORAL CARE MOUTH RINSE: 15.0000 mL | Freq: Once | OROMUCOSAL | Status: AC

## 2019-11-24 MED ORDER — CEFAZOLIN SODIUM-DEXTROSE 2-4 GM/100ML-% IV SOLN
2.0000 g | INTRAVENOUS | Status: AC
  Administered 2019-11-24: 2 g via INTRAVENOUS

## 2019-11-24 MED ORDER — SCOPOLAMINE 1 MG/3DAYS TD PT72
MEDICATED_PATCH | TRANSDERMAL | Status: AC
  Filled 2019-11-24: qty 1

## 2019-11-24 MED ORDER — SUGAMMADEX SODIUM 200 MG/2ML IV SOLN
INTRAVENOUS | Status: DC | PRN
  Administered 2019-11-24: 250 mg via INTRAVENOUS

## 2019-11-24 MED ORDER — MIDAZOLAM HCL 2 MG/2ML IJ SOLN
INTRAMUSCULAR | Status: AC
  Filled 2019-11-24: qty 2

## 2019-11-24 MED ORDER — KETOROLAC TROMETHAMINE 30 MG/ML IJ SOLN
INTRAMUSCULAR | Status: AC
  Filled 2019-11-24: qty 1

## 2019-11-24 MED ORDER — GABAPENTIN 300 MG PO CAPS: 300.0000 mg | ORAL_CAPSULE | ORAL | Status: AC

## 2019-11-24 MED ORDER — PROPOFOL 10 MG/ML IV BOLUS
INTRAVENOUS | Status: DC | PRN
  Administered 2019-11-24: 200 mg via INTRAVENOUS

## 2019-11-24 MED ORDER — MIDAZOLAM HCL 2 MG/2ML IJ SOLN
INTRAMUSCULAR | Status: DC | PRN
  Administered 2019-11-24: 2 mg via INTRAVENOUS

## 2019-11-24 MED ORDER — PROMETHAZINE HCL 25 MG/ML IJ SOLN
INTRAMUSCULAR | Status: AC
  Filled 2019-11-24: qty 1

## 2019-11-24 MED ORDER — SODIUM CHLORIDE (PF) 0.9 % IJ SOLN
INTRAMUSCULAR | Status: AC
  Filled 2019-11-24: qty 10

## 2019-11-24 MED ORDER — CEFAZOLIN SODIUM-DEXTROSE 2-4 GM/100ML-% IV SOLN
INTRAVENOUS | Status: AC
  Filled 2019-11-24: qty 100

## 2019-11-24 MED ORDER — FAMOTIDINE 20 MG PO TABS: 20.0000 mg | ORAL_TABLET | Freq: Once | ORAL | Status: AC

## 2019-11-24 MED ORDER — LIDOCAINE HCL (PF) 2 % IJ SOLN
INTRAMUSCULAR | Status: AC
  Filled 2019-11-24: qty 5

## 2019-11-24 MED ORDER — CHLORHEXIDINE GLUCONATE 0.12 % MT SOLN: 15.0000 mL | Freq: Once | OROMUCOSAL | Status: AC

## 2019-11-24 MED ORDER — FENTANYL CITRATE (PF) 100 MCG/2ML IJ SOLN
INTRAMUSCULAR | Status: DC | PRN
  Administered 2019-11-24 (×2): 50 ug via INTRAVENOUS

## 2019-11-24 MED ORDER — DEXMEDETOMIDINE (PRECEDEX) IN NS 20 MCG/5ML (4 MCG/ML) IV SYRINGE
PREFILLED_SYRINGE | INTRAVENOUS | Status: AC
  Filled 2019-11-24: qty 5

## 2019-11-24 MED ORDER — FENTANYL CITRATE (PF) 100 MCG/2ML IJ SOLN
25.0000 ug | INTRAMUSCULAR | Status: DC | PRN
  Administered 2019-11-24: 25 ug via INTRAVENOUS

## 2019-11-24 MED ORDER — OXYCODONE HCL 5 MG PO TABS: 5.0000 mg | ORAL_TABLET | ORAL | 0 refills | Status: DC | PRN

## 2019-11-24 MED ORDER — BUPIVACAINE-EPINEPHRINE (PF) 0.25% -1:200000 IJ SOLN
INTRAMUSCULAR | Status: AC
  Filled 2019-11-24: qty 30

## 2019-11-24 MED ORDER — PROMETHAZINE HCL 25 MG/ML IJ SOLN
6.2500 mg | INTRAMUSCULAR | Status: DC | PRN
  Administered 2019-11-24: 6.25 mg via INTRAVENOUS

## 2019-11-24 MED ORDER — OXYCODONE HCL 5 MG/5ML PO SOLN: 5.0000 mg | Freq: Once | ORAL | Status: AC | PRN

## 2019-11-24 MED ORDER — IBUPROFEN 600 MG PO TABS: 600.0000 mg | ORAL_TABLET | Freq: Three times a day (TID) | ORAL | 1 refills | Status: DC | PRN

## 2019-11-24 SURGICAL SUPPLY — 70 items
"PENCIL ELECTRO HAND CTR " (MISCELLANEOUS) ×1 IMPLANT
ADH SKN CLS APL DERMABOND .7 (GAUZE/BANDAGES/DRESSINGS) ×1
APL PRP STRL LF DISP 70% ISPRP (MISCELLANEOUS) ×1
BAG INFUSER PRESSURE 100CC (MISCELLANEOUS) IMPLANT
BAG SPEC RTRVL LRG 6X4 10 (ENDOMECHANICALS) ×1
BLADE SURG 15 STRL LF DISP TIS (BLADE) ×1 IMPLANT
BLADE SURG 15 STRL SS (BLADE) ×2
CANISTER SUCT 1200ML W/VALVE (MISCELLANEOUS) ×1 IMPLANT
CANNULA REDUC XI 12-8 STAPL (CANNULA) ×1
CANNULA REDUCER 12-8 DVNC XI (CANNULA) ×1 IMPLANT
CHLORAPREP W/TINT 26 (MISCELLANEOUS) ×2 IMPLANT
CLIP VESOLOCK MED LG 6/CT (CLIP) ×2 IMPLANT
COVER WAND RF STERILE (DRAPES) ×2 IMPLANT
CUP MEDICINE 2OZ PLAST GRAD ST (MISCELLANEOUS) ×2 IMPLANT
DECANTER SPIKE VIAL GLASS SM (MISCELLANEOUS) ×2 IMPLANT
DEFOGGER SCOPE WARMER CLEARIFY (MISCELLANEOUS) ×2 IMPLANT
DERMABOND ADVANCED (GAUZE/BANDAGES/DRESSINGS) ×1
DERMABOND ADVANCED .7 DNX12 (GAUZE/BANDAGES/DRESSINGS) ×1 IMPLANT
DRAPE ARM DVNC X/XI (DISPOSABLE) ×4 IMPLANT
DRAPE COLUMN DVNC XI (DISPOSABLE) ×1 IMPLANT
DRAPE DA VINCI XI ARM (DISPOSABLE) ×4
DRAPE DA VINCI XI COLUMN (DISPOSABLE) ×1
DRAPE LAPAROTOMY 77X122 PED (DRAPES) ×1 IMPLANT
ELECT CAUTERY BLADE TIP 2.5 (TIP) ×2
ELECT REM PT RETURN 9FT ADLT (ELECTROSURGICAL) ×2
ELECTRODE CAUTERY BLDE TIP 2.5 (TIP) ×1 IMPLANT
ELECTRODE REM PT RTRN 9FT ADLT (ELECTROSURGICAL) ×1 IMPLANT
GLOVE SURG SYN 7.0 (GLOVE) ×4 IMPLANT
GLOVE SURG SYN 7.0 PF PI (GLOVE) ×2 IMPLANT
GLOVE SURG SYN 7.5  E (GLOVE) ×2
GLOVE SURG SYN 7.5 E (GLOVE) ×2 IMPLANT
GLOVE SURG SYN 7.5 PF PI (GLOVE) ×2 IMPLANT
GOWN STRL REUS W/ TWL LRG LVL3 (GOWN DISPOSABLE) ×4 IMPLANT
GOWN STRL REUS W/TWL LRG LVL3 (GOWN DISPOSABLE) ×8
IRRIGATOR SUCT 8 DISP DVNC XI (IRRIGATION / IRRIGATOR) IMPLANT
IRRIGATOR SUCTION 8MM XI DISP (IRRIGATION / IRRIGATOR)
IV NS 1000ML (IV SOLUTION)
IV NS 1000ML BAXH (IV SOLUTION) IMPLANT
KIT PINK PAD W/HEAD ARE REST (MISCELLANEOUS) ×2
KIT PINK PAD W/HEAD ARM REST (MISCELLANEOUS) ×1 IMPLANT
LABEL OR SOLS (LABEL) ×2 IMPLANT
MANIFOLD NEPTUNE II (INSTRUMENTS) ×2 IMPLANT
NEEDLE HYPO 22GX1.5 SAFETY (NEEDLE) ×2 IMPLANT
NS IRRIG 500ML POUR BTL (IV SOLUTION) ×2 IMPLANT
OBTURATOR OPTICAL STANDARD 8MM (TROCAR) ×1
OBTURATOR OPTICAL STND 8 DVNC (TROCAR) ×1
OBTURATOR OPTICALSTD 8 DVNC (TROCAR) ×1 IMPLANT
PACK BASIN MINOR (MISCELLANEOUS) ×1 IMPLANT
PACK LAP CHOLECYSTECTOMY (MISCELLANEOUS) ×2 IMPLANT
PENCIL ELECTRO HAND CTR (MISCELLANEOUS) ×2 IMPLANT
POUCH SPECIMEN RETRIEVAL 10MM (ENDOMECHANICALS) ×2 IMPLANT
SEAL CANN UNIV 5-8 DVNC XI (MISCELLANEOUS) ×4 IMPLANT
SEAL XI 5MM-8MM UNIVERSAL (MISCELLANEOUS) ×4
SET TUBE SMOKE EVAC HIGH FLOW (TUBING) ×2 IMPLANT
SOLUTION ELECTROLUBE (MISCELLANEOUS) ×2 IMPLANT
SPONGE LAP 18X18 RF (DISPOSABLE) IMPLANT
SPONGE LAP 4X18 RFD (DISPOSABLE) ×2 IMPLANT
STAPLER CANNULA SEAL DVNC XI (STAPLE) ×1 IMPLANT
STAPLER CANNULA SEAL XI (STAPLE) ×1
SUT ETHIBOND 0 MO6 C/R (SUTURE) ×2 IMPLANT
SUT MNCRL AB 4-0 PS2 18 (SUTURE) ×2 IMPLANT
SUT VIC AB 2-0 SH 27 (SUTURE) ×2
SUT VIC AB 2-0 SH 27XBRD (SUTURE) ×1 IMPLANT
SUT VIC AB 3-0 SH 27 (SUTURE) ×2
SUT VIC AB 3-0 SH 27X BRD (SUTURE) ×1 IMPLANT
SUT VICRYL 0 AB UR-6 (SUTURE) ×4 IMPLANT
SYR 20ML LL LF (SYRINGE) ×3 IMPLANT
SYR BULB IRRIG 60ML STRL (SYRINGE) ×1 IMPLANT
TAPE TRANSPORE STRL 2 31045 (GAUZE/BANDAGES/DRESSINGS) ×2 IMPLANT
TROCAR BALLN GELPORT 12X130M (ENDOMECHANICALS) ×2 IMPLANT

## 2019-11-24 NOTE — Anesthesia Procedure Notes (Signed)
Procedure Name: Intubation Date/Time: 11/24/2019 7:40 AM Performed by: Hezzie Bump, CRNA Pre-anesthesia Checklist: Patient identified, Patient being monitored, Timeout performed, Emergency Drugs available and Suction available Patient Re-evaluated:Patient Re-evaluated prior to induction Oxygen Delivery Method: Circle system utilized Preoxygenation: Pre-oxygenation with 100% oxygen Induction Type: IV induction Ventilation: Mask ventilation without difficulty Laryngoscope Size: 3 and McGraph Grade View: Grade I Tube type: Oral Tube size: 7.0 mm Number of attempts: 1 Airway Equipment and Method: Stylet Placement Confirmation: ETT inserted through vocal cords under direct vision,  positive ETCO2 and breath sounds checked- equal and bilateral Secured at: 22 cm Tube secured with: Tape Dental Injury: Teeth and Oropharynx as per pre-operative assessment

## 2019-11-24 NOTE — Discharge Instructions (Signed)

## 2019-11-24 NOTE — Transfer of Care (Signed)
Immediate Anesthesia Transfer of Care Note  Patient: Samantha Glass  Procedure(s) Performed: XI ROBOTIC ASSISTED LAPAROSCOPIC CHOLECYSTECTOMY (N/A ) INDOCYANINE GREEN FLUORESCENCE IMAGING (ICG) (N/A ) HERNIA REPAIR UMBILICAL ADULT, open (N/A )  Patient Location: PACU  Anesthesia Type:General  Level of Consciousness: drowsy  Airway & Oxygen Therapy: Patient Spontanous Breathing and Patient connected to face mask oxygen  Post-op Assessment: Report given to RN and Post -op Vital signs reviewed and stable  Post vital signs: Reviewed and stable  Last Vitals:  Vitals Value Taken Time  BP 125/76 11/24/19 0952  Temp    Pulse 98 11/24/19 0953  Resp 25 11/24/19 0953  SpO2 98 % 11/24/19 0953  Vitals shown include unvalidated device data.  Last Pain:  Vitals:   11/24/19 0637  TempSrc: Tympanic  PainSc: 0-No pain         Complications: No complications documented.

## 2019-11-24 NOTE — Interval H&P Note (Signed)
History and Physical Interval Note:  11/24/2019 7:18 AM  Samantha Glass  has presented today for surgery, with the diagnosis of symtomatic cholelithiasis, umbilical hernia.  The various methods of treatment have been discussed with the patient and family. After consideration of risks, benefits and other options for treatment, the patient has consented to  Procedure(s): XI ROBOTIC ASSISTED LAPAROSCOPIC CHOLECYSTECTOMY (N/A) INDOCYANINE GREEN FLUORESCENCE IMAGING (ICG) (N/A) HERNIA REPAIR UMBILICAL ADULT, open (N/A) as a surgical intervention.  The patient's history has been reviewed, patient examined, no change in status, stable for surgery.  I have reviewed the patient's chart and labs.  Questions were answered to the patient's satisfaction.     Samantha Glass

## 2019-11-24 NOTE — Anesthesia Preprocedure Evaluation (Addendum)
Anesthesia Evaluation  Patient identified by MRN, date of birth, ID band Patient awake    Reviewed: Allergy & Precautions, H&P , NPO status , Patient's Chart, lab work & pertinent test results  History of Anesthesia Complications Negative for: history of anesthetic complications  Airway Mallampati: II  TM Distance: >3 FB Neck ROM: full    Dental  (+) Teeth Intact   Pulmonary neg pulmonary ROS, neg sleep apnea, neg COPD,    breath sounds clear to auscultation       Cardiovascular hypertension, (-) angina(-) Past MI and (-) Cardiac Stents (-) dysrhythmias  Rhythm:regular Rate:Normal     Neuro/Psych PSYCHIATRIC DISORDERS Depression negative neurological ROS     GI/Hepatic negative GI ROS, Neg liver ROS,   Endo/Other  negative endocrine ROS  Renal/GU      Musculoskeletal   Abdominal   Peds  Hematology negative hematology ROS (+)   Anesthesia Other Findings Obesity  Past Medical History: No date: Depression 04/14/2019: Depression, major, single episode, complete remission (HCC) No date: Hepatitis     Comment:  middle school age No date: Hypertension 04/14/2019: IDA (iron deficiency anemia) 04/14/2019: Obesity (BMI 30.0-34.9) No date: UTI (urinary tract infection)  Past Surgical History: age 24: TONSILLECTOMY  BMI    Body Mass Index: 34.59 kg/m      Reproductive/Obstetrics negative OB ROS                            Anesthesia Physical Anesthesia Plan  ASA: II  Anesthesia Plan: General ETT   Post-op Pain Management:    Induction:   PONV Risk Score and Plan: Ondansetron, Dexamethasone, Midazolam, Treatment may vary due to age or medical condition and Scopolamine patch - Pre-op  Airway Management Planned:   Additional Equipment:   Intra-op Plan:   Post-operative Plan:   Informed Consent: I have reviewed the patients History and Physical, chart, labs and discussed the  procedure including the risks, benefits and alternatives for the proposed anesthesia with the patient or authorized representative who has indicated his/her understanding and acceptance.     Dental Advisory Given  Plan Discussed with: Anesthesiologist, CRNA and Surgeon  Anesthesia Plan Comments:        Anesthesia Quick Evaluation

## 2019-11-24 NOTE — Anesthesia Postprocedure Evaluation (Signed)
Anesthesia Post Note  Patient: Samantha Glass  Procedure(s) Performed: XI ROBOTIC ASSISTED LAPAROSCOPIC CHOLECYSTECTOMY (N/A ) INDOCYANINE GREEN FLUORESCENCE IMAGING (ICG) (N/A ) HERNIA REPAIR UMBILICAL ADULT, open (N/A )  Patient location during evaluation: PACU Anesthesia Type: General Level of consciousness: awake and alert Pain management: pain level controlled Vital Signs Assessment: post-procedure vital signs reviewed and stable Respiratory status: spontaneous breathing and respiratory function stable Cardiovascular status: stable Anesthetic complications: no   No complications documented.   Last Vitals:  Vitals:   11/24/19 1022 11/24/19 1034  BP: 119/73 136/79  Pulse: (!) 110 (!) 101  Resp: (!) 21 18  Temp: 36.9 C (!) 36.4 C  SpO2: 98% 100%    Last Pain:  Vitals:   11/24/19 1034  TempSrc: Temporal  PainSc: 2                  Tarina Volk K

## 2019-11-24 NOTE — Op Note (Signed)
. °  Procedure Date:  11/24/2019  Pre-operative Diagnosis:   Symptomatic cholelithiasis and umbilical hernia  Post-operative Diagnosis:  Symptomatic cholelithiasis and umbilical hernia  Procedure:  Robotic assisted cholecystectomy with ICG FireFly cholangiogram; open umbilical hernia repair  Surgeon:  Howie Ill, MD  Anesthesia:  General endotracheal  Estimated Blood Loss:  10 ml  Specimens:  gallbladder  Complications:  None  Indications for Procedure:  This is a 29 y.o. female who presents with abdominal pain and workup revealing symptomatic cholelithiasis.  She also had an incidental umbilical hernia noted on prior imaging studies.  The benefits, complications, treatment options, and expected outcomes were discussed with the patient. The risks of bleeding, infection, recurrence of symptoms, failure to resolve symptoms, bile duct damage, bile duct leak, retained common bile duct stone, bowel injury, and need for further procedures were all discussed with the patient and she was willing to proceed.  Description of Procedure: The patient was correctly identified in the preoperative area and brought into the operating room.  The patient was placed supine with VTE prophylaxis in place.  Appropriate time-outs were performed.  Anesthesia was induced and the patient was intubated.  Appropriate antibiotics were infused.  The abdomen was prepped and draped in a sterile fashion. An infraumbilical incision was made. Cautery was used to dissect down the subcutaneous tissue along the umbilical stalk.  The stalk was detached from the fascia, revealing a small umbilical hernia defect.  The fascial edges were cleaned using cautery and the defect was extended to fit a 12 mm robotic port.  Pneumoperitoneum was obtained with appropriate opening pressures.  Three 8-mm ports were placed in the mid abdomen at the level of the umbilicus under direct visualization.  The DaVinci platform was docked, camera  targeted, and instruments were placed under direct visualization.  The gallbladder was identified.  The fundus was grasped and retracted cephalad.  Adhesions were lysed bluntly and with electrocautery. The infundibulum was grasped and retracted laterally, exposing the peritoneum overlying the gallbladder.  This was incised with electrocautery and extended on either side of the gallbladder.  FireFly cholangiogram was then obtained, and we were able to clearly identify the cystic duct and common bile duct.  The cystic duct and cystic artery were carefully dissected with combination of cautery and blunt dissection.  Both were clipped twice proximally and once distally, cutting in between.  The gallbladder was taken from the gallbladder fossa in a retrograde fashion with electrocautery. The gallbladder was placed in an Endocatch bag. The liver bed was inspected and any bleeding was controlled with electrocautery. The right upper quadrant was then inspected again revealing intact clips, no bleeding, and no ductal injury.  The 8 mm ports were removed under direct visualization and the 12 mm port was removed.  The Endocatch bag was brought out via the umbilical incision. The hernia defect was closed using 0 vicryl and 0 Ethibond suture.  Local anesthetic was infused in all incisions and the incisions.  The stalk was reattached to the fascia using 2-0 Vicryl.  The umbilical incision was closed using 3-0 Vicryl and 4-0 Monocryl.  The other ports were closed with 4-0 Monocryl.  The wounds were cleaned and sealed with DermaBond.  The patient was emerged from anesthesia and extubated and brought to the recovery room for further management.  The patient tolerated the procedure well and all counts were correct at the end of the case.   Howie Ill, MD

## 2019-11-25 ENCOUNTER — Telehealth: Payer: Self-pay | Admitting: *Deleted

## 2019-11-25 LAB — SURGICAL PATHOLOGY

## 2019-11-25 NOTE — Telephone Encounter (Signed)
Spoke with patient about pain management and instructed patient on how to take the pain medications of Hydrocodone-Acetaminophen 325 mg and Ibuprofen 600 mg. Patient was advised to take the Ibuprofen so she doesn't come depended on the narcotic. Patient asked if the pain is unbearable, should she take the hydrocodone. Patient was advised if the pain because moderate or severe that she can take the narcotic instead of the Ibuprofen. Patient verbalized understanding and has no further questions.

## 2019-11-25 NOTE — Telephone Encounter (Signed)
Patients husband Joselyn Glassman called and had some questions about taking the pain medications and ibuprofen and how far apart she can take them. Patient had gallbladder surgery yesterday by Dr Aleen Campi.

## 2019-11-30 NOTE — Progress Notes (Deleted)
Office Visit Note  Patient: Samantha Glass             Date of Birth: 01-03-1990           MRN: [id number]             PCP: Marval Regal, NP Referring: Marval Regal, NP Visit Date: 12/11/2019 Occupation: _0 @  Subjective:  No chief complaint on file.   History of Present Illness: Samantha Glass is a 29 y.o. female ***   Activities of Daily Living:  Patient reports morning stiffness for *** {minute/hour:19697}.   Patient {ACTIONS;DENIES/REPORTS:21021675::"Denies"} nocturnal pain.  Difficulty dressing/grooming: {ACTIONS;DENIES/REPORTS:21021675::"Denies"} Difficulty climbing stairs: {ACTIONS;DENIES/REPORTS:21021675::"Denies"} Difficulty getting out of chair: {ACTIONS;DENIES/REPORTS:21021675::"Denies"} Difficulty using hands for taps, buttons, cutlery, and/or writing: {ACTIONS;DENIES/REPORTS:21021675::"Denies"}  No Rheumatology ROS completed.   PMFS History:  Patient Active Problem List   Diagnosis Date Noted  . Umbilical hernia without obstruction and without gangrene   . Hypokalemia 10/30/2019  . Symptomatic cholelithiasis 10/30/2019  . BMI 35.0-35.9,adult 10/16/2019  . Elevated C-reactive protein (CRP) 10/16/2019  . 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 Petechiae 06/21/2019  . Routine physical examination 06/19/2019  . Depression, major, single episode, complete remission ([place]) 04/14/2019  . Obesity (BMI 30.0-34.9) 04/14/2019  . IDA (iron deficiency anemia) 04/14/2019  . Essential hypertension 05/14/2012    Past Medical History:  Diagnosis Date  . Depression   . Depression, major, single episode, complete remission ([place]) 04/14/2019  . Hepatitis    middle school age  . Hypertension   . IDA (iron deficiency anemia) 04/14/2019  . Obesity (BMI 30.0-34.9) 04/14/2019  . UTI (urinary tract infection)     Family History  Problem Relation Age of Onset  . Hypertension Mother   . Depression Mother   . Hypertension Maternal Grandmother   .  Arthritis Maternal Grandmother   . Diabetes Paternal Grandmother   . Hypertension Paternal Grandmother   . Hyperlipidemia Paternal Grandmother   . Hypertension Paternal Grandfather    Past Surgical History:  Procedure Laterality Date  . TONSILLECTOMY  age 4  . UMBILICAL HERNIA REPAIR N/A 11/24/2019   Procedure: HERNIA REPAIR UMBILICAL ADULT, open;  Surgeon: Olean Ree, MD;  Location: [facility] ORS;  Service: General;  Laterality: N/A;   Social History   Social History Narrative  . Not on file   Immunization History  Administered Date(s) Administered  . Hepatitis B 03/26/2001  . Hpv 08/03/2005, 10/11/2005, 05/01/2006  . MMR 10/11/2005, 06/14/2008  . Meningococcal Conjugate 08/03/2005  . PFIZER SARS-COV-2 Vaccination 03/20/2019, 04/15/2019  . Td 08/03/2005, 06/14/2008     Objective: Vital Signs: There were no vitals taken for this visit.   Physical Exam   Musculoskeletal Exam: ***  CDAI Exam: CDAI Score: -- Patient Global: --; Provider Global: -- Swollen: --; Tender: -- Joint Exam 12/11/2019   No joint exam has been documented for this visit   There is currently no information documented on the homunculus. Go to the Rheumatology activity and complete the homunculus joint exam.  Investigation: No additional findings.  Imaging: No results found.  Recent Labs: Lab Results  Component Value Date   WBC 8.0 10/30/2019   HGB 14.1 10/30/2019   PLT 323 10/30/2019   NA 138 10/30/2019   K 3.6 10/30/2019   CL 101 10/30/2019   CO2 25 10/30/2019   GLUCOSE 89 10/30/2019   BUN 10 10/30/2019   CREATININE 0.83 10/30/2019   BILITOT 0.6 10/30/2019   ALKPHOS 56 10/16/2019   AST 14 10/30/2019  ALT 15 10/30/2019   PROT 7.9 10/30/2019   ALBUMIN 4.5 10/16/2019   CALCIUM 10.0 10/30/2019   GFRAA >60 07/13/2019    Speciality Comments: No specialty comments available.  Procedures:  No procedures performed Allergies: Patient has no known allergies.   Assessment / Plan:      Visit Diagnoses: Elevated C-reactive protein (CRP) - 07/01/19: CRP 16.4 and ESR 1710/15/21: CRP 1.1  Petechiae  Orders: No orders of the defined types were placed in this encounter.  No orders of the defined types were placed in this encounter.   Face-to-face time spent with patient was *** minutes. Greater than 50% of time was spent in counseling and coordination of care.  Follow-Up Instructions: No follow-ups on file.   Ofilia Neas, PA-C  Note - This record has been created using Dragon software.  Chart creation errors have been sought, but may not always  have been located. Such creation errors do not reflect on  the standard of medical care.

## 2019-12-08 ENCOUNTER — Ambulatory Visit (INDEPENDENT_AMBULATORY_CARE_PROVIDER_SITE_OTHER): Payer: Managed Care, Other (non HMO) | Admitting: Physician Assistant

## 2019-12-08 ENCOUNTER — Encounter: Payer: Self-pay | Admitting: Physician Assistant

## 2019-12-08 ENCOUNTER — Other Ambulatory Visit: Payer: Self-pay

## 2019-12-08 VITALS — BP 130/90 | HR 90 | Temp 98.2°F | Ht 70.0 in | Wt 240.2 lb

## 2019-12-08 DIAGNOSIS — K8020 Calculus of gallbladder without cholecystitis without obstruction: Secondary | ICD-10-CM

## 2019-12-08 DIAGNOSIS — K429 Umbilical hernia without obstruction or gangrene: Secondary | ICD-10-CM

## 2019-12-08 DIAGNOSIS — Z09 Encounter for follow-up examination after completed treatment for conditions other than malignant neoplasm: Secondary | ICD-10-CM

## 2019-12-08 NOTE — Patient Instructions (Addendum)

## 2019-12-08 NOTE — Progress Notes (Signed)
[facility] POST-OP OFFICE VISIT  12/08/2019  HPI: Samantha Glass is a 29 y.o. female 14 days s/p Robotic assisted cholecystectomy with open umbilical hernia repair with Dr Leland Johns.  She is doing well, had some soreness mostly at the umbilicus, but this is improved. She is not needing any pain medications at this time No fever, chills, nausea, emesis She is tolerating PO but limiting fatty foods. No issues reported with incisions   Vital signs: BP 130/90   Pulse 90   Temp 98.2 F (36.8 C) (Oral)   Ht 5\' 10"  (1.778 m)   Wt 240 lb 3.2 oz (109 kg)   LMP 11/11/2019   SpO2 98%   BMI 34.47 kg/m    Physical Exam: Constitutional: Well appearing female, NAD Abdomen: Soft, non-tender,non-distended, no rebound/guarding Skin: Laparoscopic incisions are CDI with dermabond, no erythema or drainage   Assessment/Plan: This is a 29 y.o. female 14 days s/p Robotic assisted cholecystectomy with open umbilical hernia repair   - Pain control prn  - Reviewed lifting restrictions  - Reviewed surgical pathology  - She will rtc on an as needed basis   -- 37, PA-C [facility] 12/08/2019, 11:56 AM [phone] M-F: 7am - 4pm

## 2019-12-11 ENCOUNTER — Ambulatory Visit: Payer: Managed Care, Other (non HMO) | Admitting: Rheumatology

## 2019-12-14 ENCOUNTER — Other Ambulatory Visit: Payer: Self-pay

## 2019-12-14 ENCOUNTER — Ambulatory Visit (INDEPENDENT_AMBULATORY_CARE_PROVIDER_SITE_OTHER): Payer: Managed Care, Other (non HMO) | Admitting: Surgery

## 2019-12-14 ENCOUNTER — Encounter: Payer: Self-pay | Admitting: Surgery

## 2019-12-14 DIAGNOSIS — T8131XA Disruption of external operation (surgical) wound, not elsewhere classified, initial encounter: Secondary | ICD-10-CM

## 2019-12-14 DIAGNOSIS — Z09 Encounter for follow-up examination after completed treatment for conditions other than malignant neoplasm: Secondary | ICD-10-CM

## 2019-12-14 MED ORDER — SULFAMETHOXAZOLE-TRIMETHOPRIM 800-160 MG PO TABS: 1.0000 | ORAL_TABLET | Freq: Two times a day (BID) | ORAL | 0 refills | Status: DC

## 2019-12-14 NOTE — Progress Notes (Signed)
12/14/2019  HPI: Samantha Glass is a 29 y.o. female s/p robotic assisted cholecystectomy and umbilical hernia repair on 11/23.  She presents today for evaluation of her umbilical incision.  She reports that the DermaBond fell off a few days ago, and she's started having some yellow clear drainage from the umbilical incision.  She denies any worsening pain, but does report still having some soreness in the umbilical incision.  Vital signs: There were no vitals taken for this visit.   Physical Exam: Constitutional: No acute distress Abdomen:  Soft, non-distended, non-tender to palpation.  Umbilical incision has some superficial dehiscence with scab between the skin edges, but also has an area of superficial dehiscence at the base of the umbilicus, which is minimal, also with scab forming. No exposed subcutaneous tissue. There is mild erythema between the umbilical incision and the base of the umbilicus.  Assessment/Plan: This is a 29 y.o. female s/p robotic assisted cholecystectomy and umbilical hernia repair.  --Discussed with the patient that the mild dehiscence at the base of the umbilicus could be related to a cautery burn injury that's gone from the inside of the umbilicus (at the base of the stalk) across to the skin.  Applied dry gauze dressing and instructed to change once to twice daily to keep the area clean and dry. --As a precaution, will also give Bactrim 7 day course. --Follow up in 1 week.   Howie Ill, MD [facility]

## 2019-12-14 NOTE — Patient Instructions (Signed)
Pick up antibiotic from your pharmacy. Keep dressing dry and see your follow up appointment below.

## 2019-12-15 ENCOUNTER — Telehealth: Payer: Self-pay | Admitting: *Deleted

## 2019-12-15 NOTE — Telephone Encounter (Signed)
Patient wants a call back in regards to a medication questions she has. She was prescribed Bactrim and when she was reading the label she noticed it said if you are anemia or have low iron talk to your doctor before taking it. Patient stated she is anemia and wants to make sure its okay to take.

## 2019-12-15 NOTE — Telephone Encounter (Signed)
Per Dr Aleen Campi the patient's labs look good he has no concerns with her taking the Bactrim. Spoke with her and let her know this. She is amendable to taking and will call back with any further questions.

## 2019-12-18 ENCOUNTER — Ambulatory Visit (INDEPENDENT_AMBULATORY_CARE_PROVIDER_SITE_OTHER): Payer: Managed Care, Other (non HMO) | Admitting: Surgery

## 2019-12-18 ENCOUNTER — Other Ambulatory Visit: Payer: Self-pay

## 2019-12-18 ENCOUNTER — Encounter: Payer: Self-pay | Admitting: Surgery

## 2019-12-18 VITALS — BP 120/83 | HR 95 | Temp 98.5°F | Ht 71.0 in | Wt 241.8 lb

## 2019-12-18 DIAGNOSIS — K429 Umbilical hernia without obstruction or gangrene: Secondary | ICD-10-CM

## 2019-12-18 DIAGNOSIS — Z09 Encounter for follow-up examination after completed treatment for conditions other than malignant neoplasm: Secondary | ICD-10-CM

## 2019-12-18 DIAGNOSIS — T8131XA Disruption of external operation (surgical) wound, not elsewhere classified, initial encounter: Secondary | ICD-10-CM

## 2019-12-18 NOTE — Patient Instructions (Signed)
Shower as usual. Be sure to rinse the area well with running water. Then take a piece of gauze and place into the bed of the wound. Do this daily. Cover with a dry dressing and secure with tape- Continue to take the Antibiotic until gone.  +

## 2019-12-18 NOTE — Progress Notes (Signed)
12/18/2019  HPI: Samantha Glass is a 29 y.o. female s/p robotic assisted  Cholecystectomy and open umbilical hernia repair on 11/23.  Seen on 12/13 because of drainage from her umbilical wound.  She was noted to have some dehiscence of the skin superficially at the base of the umbilicus.  She called this morning to be seen because the drainage has increased.  She denies any worsening pain and continues taking the antibiotic course prescribed.  Denies any worsening pain.  Vital signs: BP 120/83   Pulse 95   Temp 98.5 F (36.9 C) (Oral)   Ht 5\' 11"  (1.803 m)   Wt 241 lb 12.8 oz (109.7 kg)   LMP 12/03/2019   SpO2 99%   BMI 33.72 kg/m    Physical Exam: Constitutional: No acute distress Abdomen: Incisions are otherwise healing well with exception of the umbilical wound.  There is an increased dehiscence that is deeper at the base of the umbilicus now with exposed subcutaneous tissue at the very base.  This was probed with a Q-tip and did not reveal any purulent fluid or evidence of infection.  There is no hernia recurrence.  Wound was dressed with dry gauze packed into the umbilicus and secured with tape.  Assessment/Plan: This is a 29 y.o. female s/p robotic assisted cholecystectomy and open umbilical hernia repair, now with deeper dehiscence of the umbilical wound.  -At this point, I do not see any evidence of worsening infection.  Recommended that she continue taking the antibiotics until completed.  I think unfortunately the skin has dehisced more following a potential burn injury from cautery on the underside of the umbilicus and now there is subcutaneous tissue exposed.  Discussed with her packing the umbilicus with dry gauze and changing this dressing at least once daily and as needed to keep the area clean and dry.  This will help control drainage as well as heal the wound.  At this point no urgent procedures are needed. -Follow-up in 2 weeks for wound check.   37,  MD [facility]

## 2019-12-22 ENCOUNTER — Ambulatory Visit: Payer: Managed Care, Other (non HMO) | Admitting: Nurse Practitioner

## 2019-12-22 ENCOUNTER — Encounter: Payer: Self-pay | Admitting: Physician Assistant

## 2019-12-22 ENCOUNTER — Ambulatory Visit: Payer: Self-pay | Admitting: Physician Assistant

## 2020-01-02 NOTE — L&D Delivery Note (Addendum)
Date of delivery: 12/23/2020 Estimated Date of Delivery: 01/25/21 Patient's last menstrual period was 04/20/2020. EGA: [redacted]w[redacted]d    Langenderfer, GirlA Sheryn [[id number]]  Delivery Note At 11:30 PM a viable female was delivered via  (Presentation:  OA, LOT).  APGAR: 8, 9; weight: 4 pounds 10 ounces     Placenta status:  spontaneous, intact.   Cord: with the following complications: none .  Cord pH: NA    Pasion, Girl B Navreet [[id number]]  Delivery Note At 11:55 PM a viable female was delivered via  (Presentation: OA, ROT).  APGAR: 8, 9; weight: 5 pounds 6 ounces   Placenta status:  spontaneous, intact  .   Cord:  with the following complications:  none.  Cord pH: NA   Patient with increased urge to push and she was taken to the OR for delivery. Mom pushed well to deliver a viable female infant/twin A.  The head followed by shoulders, which delivered without difficulty, and the rest of the body.  No nuchal cord noted. Baby was placed on mom's chest. The cord was clamped x2 and cut by the dad after 2 minute delay.  Baby to warmer. Twin B was determined to be vertex by manual exam. With the next contraction the patient pushed while membranes were ruptured with amni hook. She pushed well to deliver a viable female infant/twin B. The head followed by shoulders which delivered without difficulty and the rest of the body. Nuchal cord and body cord were reduced after delivery. Cord clamped x2 and cut by the dad after 2 min delay.  No cord blood obtained.  Placenta delivered spontaneously with steady traction, intact, with a 3-vessel cord x 2 cords.  Second degree perineal laceration repaired with 3-0 Vicryl in standard fashion.  All counts correct. Brisk bleeding was noted after delivery of the placenta. Hemostasis obtained with IV pitocin, 800 mcg cytotec, IM methergine and fundal massage.     Placenta sent to pathology. Twin A cord with single clamp. Twin B cord with double clamp.  Dr Tiburcio Pea was present  for the delivery and assisted with delivery of the placenta   Anesthesia:  epidural Episiotomy:  NA Lacerations:  second degree Suture Repair: 3.0 vicryl Est. Blood Loss (mL):  1,600  Mom to L&D for 24 hours postpartum mag.  Baby to Couplet care / Skin to Skin.   Tresea Mall, CNM 12/24/2020, 1:25 AM   Attestation of Attending Supervision of Advanced Practitioner (CNM):  Evaluation and management procedures were performed by myself and/or the Advanced Practitioner under my supervision and collaboration.  I was also present for the delivery itself, and I have reviewed the Advanced Practitioner's note and chart, and I agree with the management and plan.  Annamarie Major, MD, [facility], [facility] 12/25/2020  9:09 AM

## 2020-01-04 ENCOUNTER — Encounter: Payer: Self-pay | Admitting: Surgery

## 2020-01-04 ENCOUNTER — Other Ambulatory Visit: Payer: Self-pay

## 2020-01-04 ENCOUNTER — Ambulatory Visit (INDEPENDENT_AMBULATORY_CARE_PROVIDER_SITE_OTHER): Payer: Managed Care, Other (non HMO) | Admitting: Surgery

## 2020-01-04 VITALS — BP 127/97 | HR 86 | Temp 98.7°F | Ht 71.0 in | Wt 245.4 lb

## 2020-01-04 DIAGNOSIS — K8020 Calculus of gallbladder without cholecystitis without obstruction: Secondary | ICD-10-CM

## 2020-01-04 DIAGNOSIS — T8131XA Disruption of external operation (surgical) wound, not elsewhere classified, initial encounter: Secondary | ICD-10-CM

## 2020-01-04 DIAGNOSIS — K429 Umbilical hernia without obstruction or gangrene: Secondary | ICD-10-CM

## 2020-01-04 DIAGNOSIS — Z09 Encounter for follow-up examination after completed treatment for conditions other than malignant neoplasm: Secondary | ICD-10-CM

## 2020-01-04 NOTE — Patient Instructions (Addendum)
Continue to pack open wound until the drainage stops. Follow up in 3 weeks. See appointment below. If you feel, you do not need this appointment, feel free to let us know. If you have any concerns or questions, please call our office.

## 2020-01-05 ENCOUNTER — Encounter: Payer: Self-pay | Admitting: Surgery

## 2020-01-05 NOTE — Progress Notes (Signed)
01/05/2020  HPI: Samantha Glass is a 30 y.o. female s/p robotic assisted cholecystectomy and open umbilical hernia repair on 11/23.  She has been followed for a wound dehiscence of the base of there umbilicus, possibly a bovie injury while separating the umbilical stalk from the fascia.  She presents today for follow up.  She has been packing the wound and changing dressing twice daily.  Denies any worsening pain.  From gallbladder standpoint, she reports doing great, eating well, without any issues.  Vital signs: BP (!) 127/97   Pulse 86   Temp 98.7 F (37.1 C) (Oral)   Ht 5\' 11"  (1.803 m)   Wt 245 lb 6.4 oz (111.3 kg)   LMP 12/28/2019   SpO2 98%   BMI 34.23 kg/m    Physical Exam: Constitutional: No acute distress Abdomen:  Soft, non-distended, non-tender.  Umbilical incision is healing well, with now a layer of skin covering the whole incision.  At the base of the umbilicus, the bovie injury is still open, but much improved in the past two weeks, now with healthy, clean, wound edges, without any further exposed subcutaneous tissue.  Packed with dry gauze.  Assessment/Plan: This is a 30 y.o. female s/p robotic assisted cholecystectomy and open umbilical hernia repair.  --Discussed with the patient that the initial umbilical incision is healing well, without issues, and the bovie injury wound at the base is healing really well, without any evidence of complications.  This will continue to heal.  Continue gauze packing dressing changes.  So far has only had serous output on the gauze. --Follow up in 3 weeks.  May cancel appointment if her wound has healed by then.   37, MD [facility]

## 2020-01-06 ENCOUNTER — Other Ambulatory Visit: Payer: Self-pay | Admitting: Obstetrics and Gynecology

## 2020-01-06 DIAGNOSIS — I10 Essential (primary) hypertension: Secondary | ICD-10-CM

## 2020-01-06 MED ORDER — LABETALOL HCL 200 MG PO TABS: 200.0000 mg | ORAL_TABLET | Freq: Two times a day (BID) | ORAL | 3 refills | Status: DC

## 2020-01-08 ENCOUNTER — Ambulatory Visit: Payer: Managed Care, Other (non HMO) | Admitting: Rheumatology

## 2020-01-25 ENCOUNTER — Encounter: Payer: Self-pay | Admitting: Surgery

## 2020-02-05 ENCOUNTER — Ambulatory Visit: Payer: Managed Care, Other (non HMO) | Admitting: Family Medicine

## 2020-02-05 ENCOUNTER — Encounter: Payer: Self-pay | Admitting: Internal Medicine

## 2020-02-05 ENCOUNTER — Other Ambulatory Visit: Payer: Self-pay

## 2020-02-05 VITALS — BP 124/87 | HR 82 | Ht 71.0 in | Wt 242.7 lb

## 2020-02-05 DIAGNOSIS — E669 Obesity, unspecified: Secondary | ICD-10-CM | POA: Diagnosis not present

## 2020-02-05 DIAGNOSIS — Z7689 Persons encountering health services in other specified circumstances: Secondary | ICD-10-CM

## 2020-02-05 DIAGNOSIS — K1370 Unspecified lesions of oral mucosa: Secondary | ICD-10-CM | POA: Diagnosis not present

## 2020-02-05 DIAGNOSIS — I10 Essential (primary) hypertension: Secondary | ICD-10-CM | POA: Diagnosis not present

## 2020-02-05 NOTE — Assessment & Plan Note (Signed)
Small lesion on frenulum x 1 month, painful with eating and drinking. Plan- Unable to obtain culture here she has a relationship with Dr Cheree Ditto in [facility] so she will make an appt there.

## 2020-02-05 NOTE — Progress Notes (Signed)
Established Patient Office Visit  SUBJECTIVE:  Subjective  Patient ID: Samantha Glass, female    DOB: 1990-01-10  Age: 30 y.o. MRN: [id number]  CC:  Chief Complaint  Patient presents with  . New Patient (Initial Visit)    Patient is here to establish care with a new pcp. Patient has complaints of swollen lump on her tongue.    HPI Samantha Glass is a 30 y.o. female presenting today for     Past Medical History:  Diagnosis Date  . Depression   . 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 Hepatitis    middle school age  . Hypertension   . IDA (iron deficiency anemia) 04/14/2019  . Obesity (BMI 30.0-34.9) 04/14/2019  . UTI (urinary tract infection)     Past Surgical History:  Procedure Laterality Date  . GALLBLADDER SURGERY  2021  . TONSILLECTOMY  age 23  . UMBILICAL HERNIA REPAIR N/A 11/24/2019   Procedure: HERNIA REPAIR UMBILICAL ADULT, open;  Surgeon: Henrene Dodge, MD;  Location: [facility] ORS;  Service: General;  Laterality: N/A;    Family History  Problem Relation Age of Onset  . Hypertension Mother   . Depression Mother   . Hypertension Maternal Grandmother   . Arthritis Maternal Grandmother   . Diabetes Paternal Grandmother   . Hypertension Paternal Grandmother   . Hyperlipidemia Paternal Grandmother   . Hypertension Paternal Grandfather     Social History   Socioeconomic History  . Marital status: Married    Spouse name: Not on file  . Number of children: Not on file  . Years of education: Not on file  . Highest education level: Not on file  Occupational History  . Not on file  Tobacco Use  . Smoking status: Never Smoker  . Smokeless tobacco: Never Used  Vaping Use  . Vaping Use: Never used  Substance and Sexual Activity  . Alcohol use: Yes    Comment: occassional  . Drug use: No  . Sexual activity: Yes  Other Topics Concern  . Not on file  Social History Narrative  . Not on file   Social Determinants of  Health   Financial Resource Strain: Not on file  Food Insecurity: Not on file  Transportation Needs: Not on file  Physical Activity: Not on file  Stress: Not on file  Social Connections: Not on file  Intimate Partner Violence: Not on file     Current Outpatient Medications:  .  amLODipine (NORVASC) 10 MG tablet, Take 1 tablet (10 mg total) by mouth daily. (Patient taking differently: Take 10 mg by mouth at bedtime.), Disp: 30 tablet, Rfl: 3 .  buPROPion (WELLBUTRIN XL) 300 MG 24 hr tablet, TAKE 1 TABLET BY MOUTH EVERY DAY (Patient taking differently: Take 300 mg by mouth daily.), Disp: 90 tablet, Rfl: 2 .  hydrochlorothiazide (HYDRODIURIL) 25 MG tablet, Take 25 mg by mouth daily. Patient is only taking this medication for 1 more month., Disp: , Rfl:  .  Iron-Vitamin C (VITRON-C) 65-125 MG TABS, Take 1 tablet by mouth at bedtime. , Disp: , Rfl:  .  Probiotic Product (PROBIOTIC PO), Take 1 capsule by mouth daily., Disp: , Rfl:  .  labetalol (NORMODYNE) 200 MG tablet, Take 1 tablet (200 mg total) by mouth 2 (two) times daily. (Patient taking differently: Take 200 mg by mouth 2 (two) times daily. Patient states she is going to start this medicine next month because she is planning on trying to conceive.  She is currently taking HCTZ and will stop that next month), Disp: 60 tablet, Rfl: 3   No Known Allergies  ROS Review of Systems  Constitutional: Negative.   HENT: Negative.   Respiratory: Negative.   Cardiovascular: Negative.   Genitourinary: Negative.   Musculoskeletal: Negative.   Psychiatric/Behavioral: Negative.      OBJECTIVE:    Physical Exam Vitals and nursing note reviewed.  Constitutional:      Appearance: She is obese.  HENT:     Head: Normocephalic.     Right Ear: Tympanic membrane normal.     Left Ear: Tympanic membrane normal.     Nose: Nose normal.     Mouth/Throat:     Comments: Small lesion, pustule like lesion on her frenulum, non herpatic in appearance.   Musculoskeletal:        General: Normal range of motion.  Skin:    General: Skin is warm.  Psychiatric:        Mood and Affect: Mood normal.     BP 124/87   Pulse 82   Ht 5\' 11"  (1.803 m)   Wt 242 lb 11.2 oz (110.1 kg)   LMP 01/17/2020 (Exact Date)   BMI 33.85 kg/m  Wt Readings from Last 3 Encounters:  02/05/20 242 lb 11.2 oz (110.1 kg)  01/04/20 245 lb 6.4 oz (111.3 kg)  12/18/19 241 lb 12.8 oz (109.7 kg)    Health Maintenance Due  Topic Date Due  . Hepatitis C Screening  Never done  . HIV Screening  Never done  . TETANUS/TDAP  06/15/2018  . COVID-19 Vaccine (3 - Booster for Pfizer series) 10/15/2019    There are no preventive care reminders to display for this patient.  CBC Latest Ref Rng & Units 10/30/2019 10/16/2019 07/01/2019  WBC 3.8 - 10.8 Thousand/uL 8.0 13.9(H) 8.2  Hemoglobin 11.7 - 15.5 g/dL 07/03/2019 22.2 97.9  Hematocrit 35.0 - 45.0 % 41.2 38.6 41.1  Platelets 140 - 400 Thousand/uL 323 357 378   CMP Latest Ref Rng & Units 10/30/2019 10/16/2019 07/13/2019  Glucose 65 - 99 mg/dL 89 09/13/2019) 97  BUN 7 - 25 mg/dL 10 13 7   Creatinine 0.50 - 1.10 mg/dL 119(E 1.74  Sodium 135 - 146 mmol/L 138 136 138  Potassium 3.5 - 5.3 mmol/L 3.6 3.0(L) 3.9  Chloride 98 - 110 mmol/L 101 98 103  CO2 20 - 32 mmol/L 25 25 26   Calcium 8.6 - 10.2 mg/dL 0.81 9.7 9.3  Total Protein 6.1 - 8.1 g/dL 7.9 4.48) -  Total Bilirubin 0.2 - 1.2 mg/dL 0.6 0.7 -  Alkaline Phos 38 - 126 U/L - 56 -  AST 10 - 30 U/L 14 28 -  ALT 6 - 29 U/L 15 21 -    Lab Results  Component Value Date   TSH 1.95 04/14/2019   Lab Results  Component Value Date   ALBUMIN 4.5 10/16/2019   ANIONGAP 13 10/16/2019   GFR 109.06 04/14/2019   Lab Results  Component Value Date   CHOL 149 04/14/2019   HDL 58.30 04/14/2019   LDLCALC 71 04/14/2019   CHOLHDL 3 04/14/2019   Lab Results  Component Value Date   TRIG 98.0 04/14/2019   Lab Results  Component Value Date   HGBA1C 5.1 04/14/2019       ASSESSMENT & PLAN:   Problem List Items Addressed This Visit      Cardiovascular and Mediastinum   Essential hypertension    Pat BP wnl, going  to stop HCTZ next month due to conception, will start Labetolol 200 mg.       Relevant Medications   hydrochlorothiazide (HYDRODIURIL) 25 MG tablet     Other   Obesity (BMI 30.0-34.9)   Establishing care with new doctor, encounter for - Primary   Mouth lesion    Small lesion on frenulum x 1 month, painful with eating and drinking. Plan- Unable to obtain culture here she has a relationship with Dr Cheree Ditto in [facility] so she will make an appt there.          No orders of the defined types were placed in this encounter.     Follow-up: No follow-ups on file.    Irish Lack, FNP [facility] [address], [place], [place] [address]

## 2020-02-05 NOTE — Assessment & Plan Note (Signed)
Pat BP wnl, going to stop HCTZ next month due to conception, will start Labetolol 200 mg.

## 2020-03-04 ENCOUNTER — Encounter: Payer: Self-pay | Admitting: Family Medicine

## 2020-03-04 ENCOUNTER — Other Ambulatory Visit: Payer: Self-pay

## 2020-03-04 ENCOUNTER — Ambulatory Visit (INDEPENDENT_AMBULATORY_CARE_PROVIDER_SITE_OTHER): Payer: Managed Care, Other (non HMO) | Admitting: Family Medicine

## 2020-03-04 VITALS — BP 126/89 | HR 85 | Ht 71.0 in | Wt 243.5 lb

## 2020-03-04 DIAGNOSIS — Z0000 Encounter for general adult medical examination without abnormal findings: Secondary | ICD-10-CM | POA: Diagnosis not present

## 2020-03-04 DIAGNOSIS — E669 Obesity, unspecified: Secondary | ICD-10-CM | POA: Diagnosis not present

## 2020-03-04 DIAGNOSIS — D508 Other iron deficiency anemias: Secondary | ICD-10-CM

## 2020-03-04 DIAGNOSIS — I10 Essential (primary) hypertension: Secondary | ICD-10-CM | POA: Diagnosis not present

## 2020-03-04 DIAGNOSIS — E66811 Obesity, class 1: Secondary | ICD-10-CM

## 2020-03-04 NOTE — Assessment & Plan Note (Signed)
Patient has started reading the obesity Code for healthy diet and intermittent fasting, she will start the diet soon, I also discussed the Mediterranean diet.

## 2020-03-04 NOTE — Assessment & Plan Note (Signed)
Checking Iron today, no signs of worseing anemia.

## 2020-03-04 NOTE — Addendum Note (Signed)
Addended by: Irish Lack on: 03/04/2020 10:05 AM   Modules accepted: Orders

## 2020-03-04 NOTE — Assessment & Plan Note (Signed)
Patients BP is wnl today but she is going to attempt to conceive soon and wants to change meds per GYN. She was to stop HCTZ and start Lebetolol 200 mg bid.  Plan- Stop HCTZ and start beta blocker 1/2 tab bid then fu 2 weeks. Discussed calling back with any headaches or signs that BP is going up.

## 2020-03-04 NOTE — Progress Notes (Signed)
Established Patient Office Visit  SUBJECTIVE:  Subjective  Patient ID: Samantha Glass, female    DOB: [birth year]  Age: 30 y.o. MRN: [id number]  CC: No chief complaint on file.   HPI Samantha Glass is a 30 y.o. female presenting today for     Past Medical History:  Diagnosis Date  . Depression   . 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 Hepatitis    middle school age  . Hypertension   . IDA (iron deficiency anemia) 04/14/2019  . Obesity (BMI 30.0-34.9) 04/14/2019  . UTI (urinary tract infection)     Past Surgical History:  Procedure Laterality Date  . GALLBLADDER SURGERY  2021  . TONSILLECTOMY  age 89  . UMBILICAL HERNIA REPAIR N/A 11/24/2019   Procedure: HERNIA REPAIR UMBILICAL ADULT, open;  Surgeon: Henrene Dodge, MD;  Location: [facility] ORS;  Service: General;  Laterality: N/A;    Family History  Problem Relation Age of Onset  . Hypertension Mother   . Depression Mother   . Hypertension Maternal Grandmother   . Arthritis Maternal Grandmother   . Diabetes Paternal Grandmother   . Hypertension Paternal Grandmother   . Hyperlipidemia Paternal Grandmother   . Hypertension Paternal Grandfather     Social History   Socioeconomic History  . Marital status: Married    Spouse name: Not on file  . Number of children: Not on file  . Years of education: Not on file  . Highest education level: Not on file  Occupational History  . Not on file  Tobacco Use  . Smoking status: Never Smoker  . Smokeless tobacco: Never Used  Vaping Use  . Vaping Use: Never used  Substance and Sexual Activity  . Alcohol use: Yes    Comment: occassional  . Drug use: No  . Sexual activity: Yes  Other Topics Concern  . Not on file  Social History Narrative  . Not on file   Social Determinants of Health   Financial Resource Strain: Not on file  Food Insecurity: Not on file  Transportation Needs: Not on file  Physical Activity: Not on file   Stress: Not on file  Social Connections: Not on file  Intimate Partner Violence: Not on file     Current Outpatient Medications:  .  amLODipine (NORVASC) 10 MG tablet, Take 1 tablet (10 mg total) by mouth daily. (Patient taking differently: Take 10 mg by mouth at bedtime.), Disp: 30 tablet, Rfl: 3 .  buPROPion (WELLBUTRIN XL) 300 MG 24 hr tablet, TAKE 1 TABLET BY MOUTH EVERY DAY (Patient taking differently: Take 300 mg by mouth daily.), Disp: 90 tablet, Rfl: 2 .  hydrochlorothiazide (HYDRODIURIL) 25 MG tablet, Take 25 mg by mouth daily. Patient is only taking this medication for 1 more month., Disp: , Rfl:  .  Iron-Vitamin C (VITRON-C) 65-125 MG TABS, Take 1 tablet by mouth at bedtime. , Disp: , Rfl:  .  labetalol (NORMODYNE) 200 MG tablet, Take 1 tablet (200 mg total) by mouth 2 (two) times daily. (Patient taking differently: Take 200 mg by mouth 2 (two) times daily. Patient states she is going to start this medicine next month because she is planning on trying to conceive. She is currently taking HCTZ and will stop that next month), Disp: 60 tablet, Rfl: 3 .  Probiotic Product (PROBIOTIC PO), Take 1 capsule by mouth daily., Disp: , Rfl:    No Known Allergies  ROS Review of Systems  Constitutional: Negative.  HENT: Negative.   Eyes: Negative.   Respiratory: Negative.   Cardiovascular: Negative.   Gastrointestinal: Negative.   Musculoskeletal: Negative.   Neurological: Negative.   Psychiatric/Behavioral: Negative.      OBJECTIVE:    Physical Exam Vitals and nursing note reviewed.  Constitutional:      Appearance: She is obese.  HENT:     Head: Normocephalic.     Right Ear: Tympanic membrane normal.     Left Ear: Tympanic membrane normal.     Mouth/Throat:     Mouth: Mucous membranes are moist.  Cardiovascular:     Rate and Rhythm: Normal rate and regular rhythm.  Pulmonary:     Effort: Pulmonary effort is normal.  Musculoskeletal:        General: Normal range of  motion.     Cervical back: Normal range of motion.  Skin:    General: Skin is warm.  Neurological:     Mental Status: She is alert.  Psychiatric:        Mood and Affect: Mood normal.     BP 126/89   Pulse 85   Ht 5\' 11"  (1.803 m)   Wt 243 lb 8 oz (110.5 kg)   BMI 33.96 kg/m  Wt Readings from Last 3 Encounters:  03/04/20 243 lb 8 oz (110.5 kg)  02/05/20 242 lb 11.2 oz (110.1 kg)  01/04/20 245 lb 6.4 oz (111.3 kg)    Health Maintenance Due  Topic Date Due  . Hepatitis C Screening  Never done  . HIV Screening  Never done  . TETANUS/TDAP  06/15/2018  . COVID-19 Vaccine (3 - Booster for Pfizer series) 10/15/2019    There are no preventive care reminders to display for this patient.  CBC Latest Ref Rng & Units 10/30/2019 10/16/2019 07/01/2019  WBC 3.8 - 10.8 Thousand/uL 8.0 13.9(H) 8.2  Hemoglobin 11.7 - 15.5 g/dL 07/03/2019 96.2 22.9  Hematocrit 35.0 - 45.0 % 41.2 38.6 41.1  Platelets 140 - 400 Thousand/uL 323 357 378   CMP Latest Ref Rng & Units 10/30/2019 10/16/2019 07/13/2019  Glucose 65 - 99 mg/dL 89 09/13/2019) 97  BUN 7 - 25 mg/dL 10 13 7   Creatinine 0.50 - 1.10 mg/dL 921(J 9.41  Sodium 135 - 146 mmol/L 138 136 138  Potassium 3.5 - 5.3 mmol/L 3.6 3.0(L) 3.9  Chloride 98 - 110 mmol/L 101 98 103  CO2 20 - 32 mmol/L 25 25 26   Calcium 8.6 - 10.2 mg/dL 7.40 9.7 9.3  Total Protein 6.1 - 8.1 g/dL 7.9 8.14) -  Total Bilirubin 0.2 - 1.2 mg/dL 0.6 0.7 -  Alkaline Phos 38 - 126 U/L - 56 -  AST 10 - 30 U/L 14 28 -  ALT 6 - 29 U/L 15 21 -    Lab Results  Component Value Date   TSH 1.95 04/14/2019   Lab Results  Component Value Date   ALBUMIN 4.5 10/16/2019   ANIONGAP 13 10/16/2019   GFR 109.06 04/14/2019   Lab Results  Component Value Date   CHOL 149 04/14/2019   HDL 58.30 04/14/2019   LDLCALC 71 04/14/2019   CHOLHDL 3 04/14/2019   Lab Results  Component Value Date   TRIG 98.0 04/14/2019   Lab Results  Component Value Date   HGBA1C 5.1 04/14/2019       ASSESSMENT & PLAN:   Problem List Items Addressed This Visit      Cardiovascular and Mediastinum   Essential hypertension    Patients BP  is wnl today but she is going to attempt to conceive soon and wants to change meds per GYN. She was to stop HCTZ and start Lebetolol 200 mg bid.  Plan- Stop HCTZ and start beta blocker 1/2 tab bid then fu 2 weeks. Discussed calling back with any headaches or signs that BP is going up.         Other   Obesity (BMI 30.0-34.9) - Primary    Patient has started reading the obesity Code for healthy diet and intermittent fasting, she will start the diet soon, I also discussed the Mediterranean diet.        IDA (iron deficiency anemia)    Checking Iron today, no signs of worseing anemia.       Annual physical exam    Colon Screening-N/a Mammogram NA Pap- Is evaluated at [facility] GYN TDAP- UTD Shingles Vaccine- NA Flu Vaccine- UTD Pneumonia Vaccine- NA            No orders of the defined types were placed in this encounter.     Follow-up: No follow-ups on file.    Irish Lack, FNP [facility] [address], [place], [place] [address]

## 2020-03-04 NOTE — Assessment & Plan Note (Signed)
Colon Screening-N/a Mammogram NA Pap- Is evaluated at [facility] GYN TDAP- UTD Shingles Vaccine- NA Flu Vaccine- UTD Pneumonia Vaccine- NA

## 2020-03-05 LAB — COMPREHENSIVE METABOLIC PANEL
AG Ratio: 1.5 (calc) (ref 1.0–2.5)
ALT: 12 U/L (ref 6–29)
AST: 14 U/L (ref 10–30)
Albumin: 4.6 g/dL (ref 3.6–5.1)
Alkaline phosphatase (APISO): 53 U/L (ref 31–125)
BUN/Creatinine Ratio: 8 (calc) (ref 6–22)
BUN: 6 mg/dL — ABNORMAL LOW (ref 7–25)
CO2: 26 mmol/L (ref 20–32)
Calcium: 9.8 mg/dL (ref 8.6–10.2)
Chloride: 103 mmol/L (ref 98–110)
Creat: 0.77 mg/dL (ref 0.50–1.10)
Globulin: 3 g/dL (calc) (ref 1.9–3.7)
Glucose, Bld: 88 mg/dL (ref 65–99)
Potassium: 3.9 mmol/L (ref 3.5–5.3)
Sodium: 140 mmol/L (ref 135–146)
Total Bilirubin: 0.5 mg/dL (ref 0.2–1.2)
Total Protein: 7.6 g/dL (ref 6.1–8.1)

## 2020-03-05 LAB — CBC WITH DIFFERENTIAL/PLATELET
Absolute Monocytes: 340 cells/uL (ref 200–950)
Basophils Absolute: 20 cells/uL (ref 0–200)
Basophils Relative: 0.3 %
Eosinophils Absolute: 88 cells/uL (ref 15–500)
Eosinophils Relative: 1.3 %
HCT: 39.5 % (ref 35.0–45.0)
Hemoglobin: 13.5 g/dL (ref 11.7–15.5)
Lymphs Abs: 2176 cells/uL (ref 850–3900)
MCH: 31 pg (ref 27.0–33.0)
MCHC: 34.2 g/dL (ref 32.0–36.0)
MCV: 90.6 fL (ref 80.0–100.0)
MPV: 9.9 fL (ref 7.5–12.5)
Monocytes Relative: 5 %
Neutro Abs: 4175 cells/uL (ref 1500–7800)
Neutrophils Relative %: 61.4 %
Platelets: 325 10*3/uL (ref 140–400)
RBC: 4.36 10*6/uL (ref 3.80–5.10)
RDW: 12.1 % (ref 11.0–15.0)
Total Lymphocyte: 32 %
WBC: 6.8 10*3/uL (ref 3.8–10.8)

## 2020-03-05 LAB — THYROID PANEL WITH TSH
Free Thyroxine Index: 2.4 (ref 1.4–3.8)
T3 Uptake: 30 % (ref 22–35)
T4, Total: 7.9 ug/dL (ref 5.1–11.9)
TSH: 1.89 mIU/L

## 2020-03-05 LAB — HEMOGLOBIN A1C
Hgb A1c MFr Bld: 5.1 % of total Hgb (ref <5.7)
Mean Plasma Glucose: 100 mg/dL
eAG (mmol/L): 5.5 mmol/L

## 2020-03-05 LAB — IRON: Iron: 82 ug/dL (ref 40–190)

## 2020-03-07 ENCOUNTER — Ambulatory Visit: Payer: Managed Care, Other (non HMO) | Admitting: Obstetrics and Gynecology

## 2020-03-11 ENCOUNTER — Ambulatory Visit (INDEPENDENT_AMBULATORY_CARE_PROVIDER_SITE_OTHER): Payer: Managed Care, Other (non HMO) | Admitting: Obstetrics and Gynecology

## 2020-03-11 ENCOUNTER — Other Ambulatory Visit: Payer: Self-pay

## 2020-03-11 VITALS — BP 124/74 | Ht 71.0 in | Wt 249.0 lb

## 2020-03-11 DIAGNOSIS — N9089 Other specified noninflammatory disorders of vulva and perineum: Secondary | ICD-10-CM | POA: Diagnosis not present

## 2020-03-11 DIAGNOSIS — Z3169 Encounter for other general counseling and advice on procreation: Secondary | ICD-10-CM

## 2020-03-11 DIAGNOSIS — I10 Essential (primary) hypertension: Secondary | ICD-10-CM

## 2020-03-11 NOTE — Progress Notes (Signed)
Obstetrics & Gynecology Office Visit   Chief Complaint  Patient presents with   Follow-up    Pt having some vaginal "bumps" pop up this past week. Pt also wants to talk about trying to conceive     History of Present Illness: 30 y.o. G0P0 who presents with some concerns with some bumps on her vulvar area.  She first noted them two weeks ago.  The bumps are not painful.  They are described as purple/dark-red. The bumps are not bothersome.  She has never had bumps like this before.  She does not think this could be an STD.    She would also like to talk about conception. We recently met to discuss blood pressure control in pregnancy. She has stopped taking HCTZ and has started taking labetalol 100 mg po BID.  She feels a little tired after starting labetalol. She thinks she is getting accustomed to it daily.  The lowest blood pressure she has noted was 118/83.  She has also had a 113/80s.    Past Medical History:  Diagnosis Date   Depression    Depression, major, single episode, complete remission ([place]) 04/14/2019   Hepatitis    middle school age   Hypertension    IDA (iron deficiency anemia) 04/14/2019   Obesity (BMI 30.0-34.9) 04/14/2019   UTI (urinary tract infection)     Past Surgical History:  Procedure Laterality Date   GALLBLADDER SURGERY  2021   TONSILLECTOMY  age 34   UMBILICAL HERNIA REPAIR N/A 11/24/2019   Procedure: HERNIA REPAIR UMBILICAL ADULT, open;  Surgeon: Olean Ree, MD;  Location: [facility] ORS;  Service: General;  Laterality: N/A;    Gynecologic History: Patient's last menstrual period was 03/07/2020.  Obstetric History: G0P0  Family History  Problem Relation Age of Onset   Hypertension Mother    Depression Mother    Hypertension Maternal Grandmother    Arthritis Maternal Grandmother    Diabetes Paternal Grandmother    Hypertension Paternal Grandmother    Hyperlipidemia Paternal Grandmother    Hypertension Paternal Grandfather      Social History   Socioeconomic History   Marital status: Married    Spouse name: Not on file   Number of children: Not on file   Years of education: Not on file   Highest education level: Not on file  Occupational History   Not on file  Tobacco Use   Smoking status: Never Smoker   Smokeless tobacco: Never Used  Vaping Use   Vaping Use: Never used  Substance and Sexual Activity   Alcohol use: Yes    Comment: occassional   Drug use: No   Sexual activity: Yes  Other Topics Concern   Not on file  Social History Narrative   Not on file   Social Determinants of Health   Financial Resource Strain: Not on file  Food Insecurity: Not on file  Transportation Needs: Not on file  Physical Activity: Not on file  Stress: Not on file  Social Connections: Not on file  Intimate Partner Violence: Not on file    No Known Allergies  Prior to Admission medications   Medication Sig Start Date End Date Taking? Authorizing Provider  amLODipine (NORVASC) 10 MG tablet Take 1 tablet (10 mg total) by mouth daily. Patient taking differently: Take 10 mg by mouth at bedtime. 09/08/19  Yes Marval Regal, NP  buPROPion (WELLBUTRIN XL) 300 MG 24 hr tablet TAKE 1 TABLET BY MOUTH EVERY DAY Patient taking differently: Take 300  mg by mouth daily. 05/07/19  Yes Marval Regal, NP  Iron-Vitamin C (VITRON-C) 65-125 MG TABS Take 1 tablet by mouth at bedtime.    Yes [provider]  labetalol (NORMODYNE) 200 MG tablet Take 1 tablet (200 mg total) by mouth 2 (two) times daily. Patient taking differently: Take 200 mg by mouth 2 (two) times daily. Patient states she is going to start this medicine next month because she is planning on trying to conceive. She is currently taking HCTZ and will stop that next month 01/06/20  Yes Will Bonnet, MD  Probiotic Product (PROBIOTIC PO) Take 1 capsule by mouth daily.   Yes [provider]  hydrochlorothiazide (HYDRODIURIL) 25 MG  tablet Take 25 mg by mouth daily. Patient is only taking this medication for 1 more month. 01/30/20   [provider]    Review of Systems  Constitutional: Negative.   HENT: Negative.   Eyes: Negative.   Respiratory: Negative.   Cardiovascular: Negative.   Gastrointestinal: Negative.   Genitourinary: Negative.   Musculoskeletal: Negative.   Skin: Negative.   Neurological: Negative.   Psychiatric/Behavioral: Negative.      Physical Exam BP 124/74    Ht '5\' 11"'  (1.803 m)    Wt 249 lb (112.9 kg)    LMP 03/07/2020    BMI 34.73 kg/m  Patient's last menstrual period was 03/07/2020. Physical Exam Constitutional:      General: She is not in acute distress.    Appearance: Normal appearance.  Genitourinary:     Bladder and urethral meatus normal.     No lesions in the vagina.     Right Labia: lesions.     Right Labia: No rash, tenderness or skin changes.    Left Labia: lesions.     Left Labia: No tenderness, skin changes or rash.       No inguinal adenopathy present in the right or left side.    Pelvic Tanner Score: 5/5.    No vaginal discharge, erythema, bleeding or ulceration.     No vaginal prolapse present.     Right Adnexa: not tender, not full and no mass present.    Left Adnexa: not tender, not full and no mass present.    No cervical motion tenderness, discharge, friability, lesion or polyp.     Uterus is not enlarged, fixed or tender.     Uterus is anteverted.  HENT:     Head: Normocephalic and atraumatic.  Eyes:     General: No scleral icterus.    Conjunctiva/sclera: Conjunctivae normal.  Lymphadenopathy:     Lower Body: No right inguinal adenopathy. No left inguinal adenopathy.  Neurological:     General: No focal deficit present.     Mental Status: She is alert and oriented to person, place, and time.     Cranial Nerves: No cranial nerve deficit.  Psychiatric:        Mood and Affect: Mood normal.        Behavior: Behavior normal.        Judgment:  Judgment normal.    Female chaperone present for pelvic and breast  portions of the physical exam  Assessment: 30 y.o. G0P0 female here for  1. Vulvar lesion   2. Essential hypertension   3. Pre-conception counseling      Plan: Problem List Items Addressed This Visit      Cardiovascular and Mediastinum   Essential hypertension    Other Visit Diagnoses    Vulvar lesion    -  Primary   Pre-conception counseling         Essential hypertension: Continue labetalol.  If symptoms abate, may consider increasing dose to labetalol 200 mg bid.  At that point, may try to wean amlodipine and up-titrate labetalol versus changing to nifedipine once stopping amlodipine.  I encouraged her to communicate with me via MyChart to help titrate her medication as she is planning pregnancy soon.   Vulvar lesion;  Appears to be venous. This is a new occurrence for her.  The lesions appear more punctate than typical vulvar varicosities. The differential is broad.  If they do not result soon, will obtain a pelvic ultrasound.  There is no history of malignancy or radiation in the area. The surrounding skin appears otherwise normal.  May need dermatology consult, if unable to identify the exact nature of these lesions.  A total of 25 minutes were spent face-to-face with the patient as well as preparation, review, communication, and documentation during this encounter.    Prentice Docker, MD  03/11/2020  3:56 PM

## 2020-03-12 ENCOUNTER — Encounter: Payer: Self-pay | Admitting: Obstetrics and Gynecology

## 2020-03-18 ENCOUNTER — Ambulatory Visit: Payer: Managed Care, Other (non HMO) | Admitting: Family Medicine

## 2020-04-08 ENCOUNTER — Encounter: Payer: Self-pay | Admitting: Family Medicine

## 2020-04-08 ENCOUNTER — Other Ambulatory Visit: Payer: Self-pay | Admitting: *Deleted

## 2020-04-08 MED ORDER — AMLODIPINE BESYLATE 10 MG PO TABS: 10.0000 mg | ORAL_TABLET | Freq: Every day | ORAL | 3 refills | Status: DC

## 2020-04-21 ENCOUNTER — Other Ambulatory Visit: Payer: Self-pay | Admitting: *Deleted

## 2020-04-21 MED ORDER — BUPROPION HCL ER (XL) 300 MG PO TB24
300.0000 mg | ORAL_TABLET | Freq: Every day | ORAL | 6 refills | Status: DC
Start: 2020-04-21 — End: 2020-06-30

## 2020-05-18 ENCOUNTER — Other Ambulatory Visit: Payer: Self-pay

## 2020-05-18 ENCOUNTER — Encounter: Payer: Self-pay | Admitting: Obstetrics and Gynecology

## 2020-05-18 ENCOUNTER — Ambulatory Visit: Payer: Managed Care, Other (non HMO) | Admitting: Obstetrics and Gynecology

## 2020-05-18 VITALS — Ht 71.0 in | Wt 249.0 lb

## 2020-05-18 DIAGNOSIS — Z3201 Encounter for pregnancy test, result positive: Secondary | ICD-10-CM | POA: Diagnosis not present

## 2020-05-18 DIAGNOSIS — I10 Essential (primary) hypertension: Secondary | ICD-10-CM

## 2020-05-18 LAB — POCT URINE PREGNANCY: Preg Test, Ur: POSITIVE — AB

## 2020-05-18 NOTE — Progress Notes (Signed)
Obstetrics & Gynecology Office Visit   Chief Complaint  Patient presents with  . Medication Management    Wants to make sure her meds are safe in pregnancy    History of Present Illness: 30 y.o. G1 female who presents to discuss blood pressure medication in pregnancy. She recently found out she is pregnant and would like to optimize her BP medication.  Her LMP was 04/23/2020.  Her cycles are usually about 25 days. She is currently taking amlodipine 10 mg, labetalol  100 mg po bid. She is recently pregnant.  Denies bleeding or spotting.   At home BP : 125/82, 124/85   Past Medical History:  Diagnosis Date  . Depression   . 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 Hepatitis    middle school age  . Hypertension   . IDA (iron deficiency anemia) 04/14/2019  . Obesity (BMI 30.0-34.9) 04/14/2019  . UTI (urinary tract infection)     Past Surgical History:  Procedure Laterality Date  . GALLBLADDER SURGERY  2021  . TONSILLECTOMY  age 81  . UMBILICAL HERNIA REPAIR N/A 11/24/2019   Procedure: HERNIA REPAIR UMBILICAL ADULT, open;  Surgeon: Henrene Dodge, MD;  Location: [facility] ORS;  Service: General;  Laterality: N/A;    Gynecologic History: Patient's last menstrual period was 04/23/2020.  Obstetric History: G0P0  Family History  Problem Relation Age of Onset  . Hypertension Mother   . Depression Mother   . Hypertension Maternal Grandmother   . Arthritis Maternal Grandmother   . Diabetes Paternal Grandmother   . Hypertension Paternal Grandmother   . Hyperlipidemia Paternal Grandmother   . Hypertension Paternal Grandfather     Social History   Socioeconomic History  . Marital status: Married    Spouse name: Not on file  . Number of children: Not on file  . Years of education: Not on file  . Highest education level: Not on file  Occupational History  . Not on file  Tobacco Use  . Smoking status: Never Smoker  . Smokeless tobacco: Never Used   Vaping Use  . Vaping Use: Never used  Substance and Sexual Activity  . Alcohol use: Not Currently    Comment: occassional  . Drug use: No  . Sexual activity: Yes  Other Topics Concern  . Not on file  Social History Narrative  . Not on file   Social Determinants of Health   Financial Resource Strain: Not on file  Food Insecurity: Not on file  Transportation Needs: Not on file  Physical Activity: Not on file  Stress: Not on file  Social Connections: Not on file  Intimate Partner Violence: Not on file    No Known Allergies  Prior to Admission medications   Medication Sig Start Date End Date Taking? Authorizing Provider  amLODipine (NORVASC) 10 MG tablet Take 1 tablet (10 mg total) by mouth at bedtime. 04/08/20  Yes Irish Lack, FNP  buPROPion (WELLBUTRIN XL) 300 MG 24 hr tablet Take 1 tablet (300 mg total) by mouth daily. 04/21/20  Yes Masoud, Renda Rolls, MD  Iron-Vitamin C (VITRON-C) 65-125 MG TABS Take 1 tablet by mouth at bedtime.    Yes [provider]  labetalol (NORMODYNE) 200 MG tablet Take 1 tablet (200 mg total) by mouth 2 (two) times daily. Patient taking differently: Take 200 mg by mouth 2 (two) times daily. Patient states she is going to start this medicine next month because she is planning on trying to conceive. She is currently taking  HCTZ and will stop that next month 01/06/20  Yes Conard Novak, MD  Probiotic Product (PROBIOTIC PO) Take 1 capsule by mouth daily.   Yes [provider]    Review of Systems  Constitutional: Negative.   HENT: Negative.   Eyes: Negative.   Respiratory: Negative.   Cardiovascular: Negative.   Gastrointestinal: Negative.   Genitourinary: Negative.   Musculoskeletal: Negative.   Skin: Negative.   Neurological: Negative.   Psychiatric/Behavioral: Negative.      Physical Exam Ht 5\' 11"  (1.803 m)   Wt 249 lb (112.9 kg)   LMP 04/23/2020   BMI 34.73 kg/m  Patient's last menstrual period was 04/23/2020.  BP:  125/82 Physical Exam Constitutional:      General: She is not in acute distress.    Appearance: Normal appearance.  HENT:     Head: Normocephalic and atraumatic.  Eyes:     General: No scleral icterus.    Conjunctiva/sclera: Conjunctivae normal.  Neurological:     General: No focal deficit present.     Mental Status: She is alert and oriented to person, place, and time.     Cranial Nerves: No cranial nerve deficit.  Psychiatric:        Mood and Affect: Mood normal.        Behavior: Behavior normal.        Judgment: Judgment normal.     Female chaperone present for pelvic and breast  portions of the physical exam  Assessment: 29 y.o. G0P0 female here for  1. Positive pregnancy test   2. Essential hypertension      Plan: Problem List Items Addressed This Visit      Cardiovascular and Mediastinum   Essential hypertension    Other Visit Diagnoses    Positive pregnancy test    -  Primary   Relevant Orders   POCT urine pregnancy (Completed)     Cut amlodipine to 5 mg  Increase labetalol to 200 mg po bid or 200 mg po tid or 100 mg tid, as needed. Plan is to eventually switch from amlodipine to nifedipine.    A total of 22 minutes were spent face-to-face with the patient as well as preparation, review, communication, and documentation during this encounter.    37, MD 05/18/2020 2:33 PM

## 2020-05-23 ENCOUNTER — Other Ambulatory Visit: Payer: Self-pay | Admitting: Obstetrics and Gynecology

## 2020-05-23 DIAGNOSIS — I10 Essential (primary) hypertension: Secondary | ICD-10-CM

## 2020-05-23 MED ORDER — AMLODIPINE BESYLATE 5 MG PO TABS: 5.0000 mg | ORAL_TABLET | Freq: Every day | ORAL | 0 refills | Status: DC

## 2020-06-07 ENCOUNTER — Telehealth: Payer: Self-pay | Admitting: Obstetrics and Gynecology

## 2020-06-07 NOTE — Telephone Encounter (Signed)
Patient voiced concerns regarding taking doxylamine along with her issue with hypertension.  There is not a lot of data around this. However, the dose of doxylamine is low. I recommended she start by focusing on Vitamin B6 25 mg tid. If she needs to add more doxylamine, we can consider a different agent.  She also states that her BPs are doing fairly well on her current BP regimen. Will re-address at her appt in 3 days.

## 2020-06-10 ENCOUNTER — Other Ambulatory Visit: Payer: Self-pay

## 2020-06-10 ENCOUNTER — Encounter: Payer: Managed Care, Other (non HMO) | Admitting: Obstetrics and Gynecology

## 2020-06-21 ENCOUNTER — Encounter: Payer: Self-pay | Admitting: Obstetrics and Gynecology

## 2020-06-21 ENCOUNTER — Other Ambulatory Visit: Payer: Self-pay

## 2020-06-21 ENCOUNTER — Ambulatory Visit (INDEPENDENT_AMBULATORY_CARE_PROVIDER_SITE_OTHER): Payer: Managed Care, Other (non HMO) | Admitting: Obstetrics and Gynecology

## 2020-06-21 ENCOUNTER — Other Ambulatory Visit (HOSPITAL_COMMUNITY)
Admission: RE | Admit: 2020-06-21 | Discharge: 2020-06-21 | Disposition: A | Discharge status: Home / Self Care | Payer: Managed Care, Other (non HMO) | Source: Ambulatory Visit | Attending: Obstetrics and Gynecology | Admitting: Obstetrics and Gynecology

## 2020-06-21 VITALS — BP 122/70 | Ht 71.0 in | Wt 245.0 lb

## 2020-06-21 DIAGNOSIS — O30041 Twin pregnancy, dichorionic/diamniotic, first trimester: Secondary | ICD-10-CM

## 2020-06-21 DIAGNOSIS — Z3401 Encounter for supervision of normal first pregnancy, first trimester: Secondary | ICD-10-CM | POA: Diagnosis present

## 2020-06-21 DIAGNOSIS — O0990 Supervision of high risk pregnancy, unspecified, unspecified trimester: Secondary | ICD-10-CM | POA: Insufficient documentation

## 2020-06-21 DIAGNOSIS — O219 Vomiting of pregnancy, unspecified: Secondary | ICD-10-CM

## 2020-06-21 DIAGNOSIS — O30049 Twin pregnancy, dichorionic/diamniotic, unspecified trimester: Secondary | ICD-10-CM | POA: Insufficient documentation

## 2020-06-21 DIAGNOSIS — Z3A08 8 weeks gestation of pregnancy: Secondary | ICD-10-CM

## 2020-06-21 LAB — POCT URINALYSIS DIPSTICK OB: Glucose, UA: NEGATIVE

## 2020-06-21 MED ORDER — ONDANSETRON 4 MG PO TBDP: 4.0000 mg | ORAL_TABLET | Freq: Four times a day (QID) | ORAL | 3 refills | Status: DC | PRN

## 2020-06-21 MED ORDER — PROMETHAZINE HCL 25 MG PO TABS: 25.0000 mg | ORAL_TABLET | Freq: Four times a day (QID) | ORAL | 3 refills | Status: DC | PRN

## 2020-06-21 NOTE — Telephone Encounter (Signed)
Hey Dr Jean Rosenthal, patient is scheduled to see you on 07/05. You are completely booked until then. Do you want her worked in on your schedule or is she ok to wait till then?

## 2020-06-21 NOTE — Patient Instructions (Addendum)
Dichorionic Diamniotic Twin Pregnancy Plan  Mom should take 60-15m of elemental iron a day as well as 139mof folic acid 1852-77ks:  Anatomy Ultrasound 20-Delivery: Growth Ultrasounds every 2-4 weeks** 36 wks: Initiate NSTs (initiate earlier if indicated) Delivery No complications: 38 0/7 - 38 6/7 Isolated IUGR: 36 0/7- 37 6/7 IUGR with abnormal dopplers, hypertension, etc:  32 0/7 - 34 6/7   Initial steps to help :   B6 (pyridoxine) 25 mg,  3-4 times a day- 200 mg a day total Unisom (doxylamine) 25 mg at bedtime **B6 and Unisom are available as a combination prescription medications called diclegis and bonjesta  B1 (thiamin)  50-100 mg 1-2 a day-  100 mg a day total  Continue prenatal vitamin with iron and thiamin. If it is not tolerated switch to 1 mg of folic acid.  Can add medication for gastric reflux if needed.  Subsequent steps to be added to B1, B6, and Unisom:  Antihistamine (one of the following medications) Dramamine      25-50 mg every 4-6 hours Benadryl      25-50 mg every 4-6 hours Meclizine      25 mg every 6 hours  2. Dopamine Antagonist (one of the following medications) Metoclopramide  (Reglan)  5-10 mg every 6-8 hours         PO Promethazine   (Phenergan)   12.5-25 mg every 4-6 hours      PO or rectal Prochlorperazine  (Compazine)  5-10 mg every 6-8 hours     2543mID rectally   Subsequent steps if there has still not been improvement in symptoms:  3. Daily stool softner:  Colace 100 mg twice a day  4. Ondansetron  (Zofran)   4-8 mg every 6-8 hours    First Trimester of Pregnancy  The first trimester of pregnancy starts on the first day of your last menstrual period until the end of week 12. This is months 1 through 3 of pregnancy. A week after a sperm fertilizes an egg, the egg will implant into the wall of the uterus and begin to develop into a baby. By the end of 12 weeks, all the baby'sorgans will be formed and the baby will be 2-3 inches in  size. Body changes during your first trimester Your body goes through many changes during pregnancy. The changes vary andgenerally return to normal after your baby is born. Physical changes You may gain or lose weight. Your breasts may begin to grow larger and become tender. The tissue that surrounds your nipples (areola) may become darker. Dark spots or blotches (chloasma or mask of pregnancy) may develop on your face. You may have changes in your hair. These can include thickening or thinning of your hair or changes in texture. Health changes You may feel nauseous, and you may vomit. You may have heartburn. You may develop headaches. You may develop constipation. Your gums may bleed and may be sensitive to brushing and flossing. Other changes You may tire easily. You may urinate more often. Your menstrual periods will stop. You may have a loss of appetite. You may develop cravings for certain kinds of food. You may have changes in your emotions from day to day. You may have more vivid and strange dreams. Follow these instructions at home: Medicines Follow your health care provider's instructions regarding medicine use. Specific medicines may be either safe or unsafe to take during pregnancy. Do not take any medicines unless told to by your health care  provider. Take a prenatal vitamin that contains at least 600 micrograms (mcg) of folic acid. Eating and drinking Eat a healthy diet that includes fresh fruits and vegetables, whole grains, good sources of protein such as meat, eggs, or tofu, and low-fat dairy products. Avoid raw meat and unpasteurized juice, milk, and cheese. These carry germs that can harm you and your baby. If you feel nauseous or you vomit: Eat 4 or 5 small meals a day instead of 3 large meals. Try eating a few soda crackers. Drink liquids between meals instead of during meals. You may need to take these actions to prevent or treat constipation: Drink enough fluid  to keep your urine pale yellow. Eat foods that are high in fiber, such as beans, whole grains, and fresh fruits and vegetables. Limit foods that are high in fat and processed sugars, such as fried or sweet foods. Activity Exercise only as directed by your health care provider. Most people can continue their usual exercise routine during pregnancy. Try to exercise for 30 minutes at least 5 days a week. Stop exercising if you develop pain or cramping in the lower abdomen or lower back. Avoid exercising if it is very hot or humid or if you are at high altitude. Avoid heavy lifting. If you choose to, you may have sex unless your health care provider tells you not to. Relieving pain and discomfort Wear a good support bra to relieve breast tenderness. Rest with your legs elevated if you have leg cramps or low back pain. If you develop bulging veins (varicose veins) in your legs: Wear support hose as told by your health care provider. Elevate your feet for 15 minutes, 3-4 times a day. Limit salt in your diet. Safety Wear your seat belt at all times when driving or riding in a car. Talk with your health care provider if someone is verbally or physically abusive to you. Talk with your health care provider if you are feeling sad or have thoughts of hurting yourself. Lifestyle Do not use hot tubs, steam rooms, or saunas. Do not douche. Do not use tampons or scented sanitary pads. Do not use herbal remedies, alcohol, illegal drugs, or medicines that are not approved by your health care provider. Chemicals in these products can harm your baby. Do not use any products that contain nicotine or tobacco, such as cigarettes, e-cigarettes, and chewing tobacco. If you need help quitting, ask your health care provider. Avoid cat litter boxes and soil used by cats. These carry germs that can cause birth defects in the baby and possibly loss of the unborn baby (fetus) by miscarriage or stillbirth. General  instructions During routine prenatal visits in the first trimester, your health care provider will do a physical exam, perform necessary tests, and ask you how things are going. Keep all follow-up visits. This is important. Ask for help if you have counseling or nutritional needs during pregnancy. Your health care provider can offer advice or refer you to specialists for help with various needs. Schedule a dentist appointment. At home, brush your teeth with a soft toothbrush. Floss gently. Write down your questions. Take them to your prenatal visits. Where to find more information American Pregnancy Association: americanpregnancy.[place] and Gynecologists: PoolDevices.com.pt Office on [employer]: KeywordPortfolios.com.br Contact a health care provider if you have: Dizziness. A fever. Mild pelvic cramps, pelvic pressure, or nagging pain in the abdominal area. Nausea, vomiting, or diarrhea that lasts for 24 hours or longer. A bad-smelling vaginal  discharge. Pain when you urinate. Known exposure to a contagious illness, such as chickenpox, measles, Zika virus, HIV, or hepatitis. Get help right away if you have: Spotting or bleeding from your vagina. Severe abdominal cramping or pain. Shortness of breath or chest pain. Any kind of trauma, such as from a fall or a car crash. New or increased pain, swelling, or redness in an arm or leg. Summary The first trimester of pregnancy starts on the first day of your last menstrual period until the end of week 12 (months 1 through 3). Eating 4 or 5 small meals a day rather than 3 large meals may help to relieve nausea and vomiting. Do not use any products that contain nicotine or tobacco, such as cigarettes, e-cigarettes, and chewing tobacco. If you need help quitting, ask your health care provider. Keep all follow-up visits. This is important. This information is not intended to replace advice  given to you by your health care provider. Make sure you discuss any questions you have with your healthcare provider. Document Revised: 05/27/2019 Document Reviewed: 04/02/2019 Elsevier Patient Education  2022 [place]. Pregnancy and Vaccinations Vaccines can help to keep you healthy. There are some vaccines that should begiven before pregnancy and some that should be given during pregnancy. How does this affect me? If you are pregnant or thinking about getting pregnant, talk with your healthcare provider about what vaccines are right for you. How does this affect my baby? Usually, the benefits of receiving vaccines during pregnancy outweigh the risks of harm to you or your baby if: The risk of being exposed to a disease is high. Infection would pose a risk to you or your unborn baby. The vaccine is not likely to cause harm. Vaccines can help protect your baby from some diseases until he or she is oldenough to get the vaccine. What can I do to lower my risk? When you receive the recommended vaccines, it helps to protect you from gettingcertain diseases and passing them on to your baby. Should I receive vaccines before pregnancy? If possible, make sure that your vaccines are up to date before you become pregnant. It is safe and important for you to receive weakened viral and weakened bacterial vaccines (inactivated vaccines) as needed before you are pregnant. Live viral and live bacterial vaccines, such as the measles, mumps, and rubella (MMR) vaccine, should be given 1 month or more before pregnancy. Sometimes, women become pregnant within 1 month of receiving a live vaccine that is not usually recommended during pregnancy. The U.S. Centers for Disease Control and Prevention (CDC) has reported that when this has happened, vaccineshave not harmed pregnant women or their unborn babies. Should I receive vaccines during pregnancy?  It is safe and important for you to receive some inactivated  vaccines as needed during pregnancy. Until your baby can receive vaccines, your baby will get some protection from diseases through the vaccines that you receive while you arepregnant. During your pregnancy, you should receive the following: Influenza vaccine (the flu shot). The flu shot may protect you and your baby (up to 66 months of age) from some complications associated with strains of influenza that are covered by the vaccine. Pregnant women can receive the flu shot at any time during pregnancy. Tetanus, diphtheria, and pertussis (Tdap) vaccine. The Tdap vaccine will help to prevent whooping cough (pertussis) in you and your baby. You should receive 1 dose of this vaccine during each pregnancy. It is recommended that pregnant women receive this vaccine  between 27 and 36 weeks of pregnancy. Should I receive vaccines after pregnancy? It is safe and important for you to receive vaccines as needed after pregnancy. Some are safe to have if you are breastfeeding. Other vaccines may not be safe to have until after you have stopped breastfeeding. If you did not receive the Tdap vaccine during your pregnancy and have never received a Tdap vaccine, you should receive that vaccine right after you give birth to your baby (delivery). If you are not immune to measles, mumps, rubella, or chickenpox (varicella), you should receive those vaccines within days after delivery. It is important to talk with your health care provider about what vaccines you may need after delivery. What if I am pregnant and I plan to travel internationally? If you are pregnant and you are planning to travel internationally, talk with your health care provider at least 4-6 weeks before your trip. Depending on the country you are planning to visit, you may need to take special precautions orget certain vaccines to prevent disease. Vaccines that may be recommended for pregnant international travelers include: Influenza (the flu  shot). Tetanus and diphtheria (Td) or Tdap. Hepatitis B (HepB). Hepatitis A (HepA). Your health care provider can help you decide if you need vaccines and if thebenefits outweigh the risk of disease exposure. Follow these instructions at home: Take over-the-counter and prescription medicines as told by your health care provider. Keep all follow-up visits as told by your health care provider. This is important. Questions to ask your health care provider: What vaccines are safe during pregnancy? What are the risks of vaccines during pregnancy? What are the potential side effects of vaccines during or after pregnancy? When should I get vaccines during pregnancy? Contact a health care provider if you: Believe you have had a reaction to a vaccine. Have concerns or questions about a vaccine. Become pregnant within 1 month after you have received a live vaccine. Summary Vaccines are the most effective way to prevent certain diseases. Many vaccines are safe to receive during pregnancy. Some vaccines are recommended during pregnancy to protect you and your baby from getting sick. If you are pregnant or planning to become pregnant, talk with your health care provider about what vaccines are right for you. This information is not intended to replace advice given to you by your health care provider. Make sure you discuss any questions you have with your healthcare provider. Document Revised: 05/27/2019 Document Reviewed: 01/22/2018 Elsevier Patient Education  2022 [employer].

## 2020-06-21 NOTE — Progress Notes (Signed)
06/21/2020   Chief Complaint: Missed period  Transfer of Care Patient: no  History of Present Illness: Ms. Samantha Glass is a 30 y.o. G1P0 3364w3d based on Patient's last menstrual period was 04/23/2020. with an Estimated Date of Delivery: 01/28/21, with the above CC.   Her periods were: monthly She was using no method when she conceived.  She has Positive signs or symptoms of nausea/vomiting of pregnancy. She has Negative signs or symptoms of miscarriage or preterm labor She was not taking different medications around the time she conceived/early pregnancy. Since her LMP, she has used alcohol. Reports a glass of wine before finding out about pregnancy.                                                                                                                                                                                              Since her LMP, she has not used tobacco products Since her LMP, she has not used illegal drugs.   Current or past history of domestic violence. No   She reports she has a history of chronic hypertension for which she takes labetalol 100 mg twice a day and amlodipine 5 mg once a day.  She checks her blood pressure at home and reports that she normally has values in the 120s systolic.  At in the evening before she takes her amlodipine her systolic values will increase to the 130s.  Infection History:  1. Since her LMP, she has not had a viral illness.  2. She denies close contact with children on a regular basis     3. She has a history of chicken pox. She is uncertain about vaccination for chicken pox in the past. 4. Patient or partner has history of genital herpes  no 5. History of STI (GC, CT, HPV, syphilis, HIV)  no   6.  She does not live with someone with TB or TB exposed. 7. History of recent travel :  no 8. She identifies Negative Zika risk factors for her and her partner 69. There are cats in the home in the home.  She understands that while pregnant she  should not change cat litter.   Genetic Screening Questions: (Includes patient, baby's father, or anyone in either family)   1. [facility]/= 7835 [facility]  no 2. Thalassemia ([place], [place], Mediterranean, or Asian background): MCV<80  no 3. Neural tube defect (meningomyelocele, spina bifida, anencephaly)  no 4. Congenital heart defect  no  5. Down syndrome  no 6. Tay-Sachs (Jewish, [place])  no 7. Canavan's Disease  no 8. Sickle cell disease or trait (African)  no  9. Hemophilia or other blood disorders  no  10. Muscular dystrophy  no  11. Cystic fibrosis  no  12. Huntington's Chorea  no  13. Mental retardation/autism  no 14. Other inherited genetic or chromosomal disorder  no 15. Maternal metabolic disorder (DM, PKU, etc)  no 16. Patient or FOB with a child with a birth defect not listed above no  16a. Patient or FOB with a birth defect themselves no 17. Recurrent pregnancy loss, or stillbirth  no  18. Any medications since LMP other than prenatal vitamins (include vitamins, supplements, OTC meds, drugs, alcohol)  no 19. Any other genetic/environmental exposure to discuss  no  ROS:  Review of Systems  Constitutional:  Negative for chills, fever, malaise/fatigue and weight loss.  HENT:  Negative for congestion, hearing loss and sinus pain.   Eyes:  Negative for blurred vision and double vision.  Respiratory:  Negative for cough, sputum production, shortness of breath and wheezing.   Cardiovascular:  Negative for chest pain, palpitations, orthopnea and leg swelling.  Gastrointestinal:  Negative for abdominal pain, constipation, diarrhea, nausea and vomiting.  Genitourinary:  Negative for dysuria, flank pain, frequency, hematuria and urgency.  Musculoskeletal:  Negative for back pain, falls and joint pain.  Skin:  Negative for itching and rash.  Neurological:  Negative for dizziness and headaches.  Psychiatric/Behavioral:  Negative for depression, substance abuse and  suicidal ideas. The patient is not nervous/anxious.    OBGYN History: As per HPI. 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   Hepatitis    middle school age   Hypertension    IDA (iron deficiency anemia) 04/14/2019   Obesity (BMI 30.0-34.9) 04/14/2019   UTI (urinary tract infection)     Past Surgical History: Past Surgical History:  Procedure Laterality Date   GALLBLADDER SURGERY  2021   TONSILLECTOMY  age 56   UMBILICAL HERNIA REPAIR N/A 11/24/2019   Procedure: HERNIA REPAIR UMBILICAL ADULT, open;  Surgeon: Henrene Dodge, MD;  Location: [facility] ORS;  Service: General;  Laterality: N/A;    Family History:  Family History  Problem Relation Age of Onset   Hypertension Mother    Depression Mother    Hypertension Maternal Grandmother    Arthritis Maternal Grandmother    Diabetes Paternal Grandmother    Hypertension Paternal Grandmother    Hyperlipidemia Paternal Grandmother    Hypertension Paternal Grandfather    She denies any female cancers, bleeding or blood clotting disorders.   Social History:  Social History   Socioeconomic History   Marital status: Married    Spouse name: Not on file   Number of children: Not on file   Years of education: Not on file   Highest education level: Not on file  Occupational History   Not on file  Tobacco Use  Smoking status: Never   Smokeless tobacco: Never  Vaping Use   Vaping Use: Never used  Substance and Sexual Activity   Alcohol use: Not Currently     Comment: occassional   Drug use: No   Sexual activity: Yes  Other Topics Concern   Not on file  Social History Narrative   Not on file   Social Determinants of Health   Financial Resource Strain: Not on file  Food Insecurity: Not on file  Transportation Needs: Not on file  Physical Activity: Not on file  Stress: Not on file  Social Connections: Not on file  Intimate Partner Violence: Not on file    Allergy: No Known Allergies  Current Outpatient Medications:  Current Outpatient Medications:    amLODipine (NORVASC) 5 MG tablet, Take 1 tablet (5 mg total) by mouth daily., Disp: 90 tablet, Rfl: 0   buPROPion (WELLBUTRIN XL) 300 MG 24 hr tablet, Take 1 tablet (300 mg total) by mouth daily., Disp: 30 tablet, Rfl: 6   Iron-Vitamin C (VITRON-C) 65-125 MG TABS, Take 1 tablet by mouth at bedtime. , Disp: , Rfl:    labetalol (NORMODYNE) 200 MG tablet, Take 1 tablet (200 mg total) by mouth 2 (two) times daily. (Patient taking differently: Take 200 mg by mouth 2 (two) times daily. Patient states she is going to start this medicine next month because she is planning on trying to conceive. She is currently taking HCTZ and will stop that next month), Disp: 60 tablet, Rfl: 3   Probiotic Product (PROBIOTIC PO), Take 1 capsule by mouth daily. (Patient not taking: Reported on 06/21/2020), Disp: , Rfl:    Physical Exam: Physical Exam Vitals and nursing note reviewed.  HENT:     Head: Normocephalic and atraumatic.  Eyes:     Pupils: Pupils are equal, round, and reactive to light.  Neck:     Thyroid: No thyromegaly.  Cardiovascular:     Rate and Rhythm: Normal rate and regular rhythm.  Pulmonary:     Effort: Pulmonary effort is normal.  Abdominal:     General: Bowel sounds are normal. There is no distension.     Palpations: Abdomen is soft.     Tenderness: There is no abdominal tenderness. There is no guarding or rebound.  Genitourinary:    Comments: External: Normal appearing vulva. No  lesions noted.  Speculum examination: Normal appearing cervix. No blood in the vaginal vault. Pink discharge.  Bimanual examination: Uterus midline, non-tender, normal in size, shape and contour.  No CMT. No adnexal masses. No adnexal tenderness. Pelvis not fixed.  Musculoskeletal:        General: Normal range of motion.     Cervical back: Normal range of motion and neck supple.  Skin:    General: Skin is warm and dry.  Neurological:     Mental Status: She is alert and oriented to person, place, and time.  Psychiatric:        Mood and Affect: Affect normal.        Judgment: Judgment normal.     Assessment: Samantha Glass is a 30 y.o. G1P0 [redacted]w[redacted]d based on Patient's last menstrual period was 04/23/2020. with an Estimated Date of Delivery: 01/28/21,  for prenatal care.  Plan:  1) Avoid alcoholic beverages. 2) Patient encouraged not to smoke.  3) Discontinue the use of all non-medicinal drugs and chemicals.  4) Take prenatal vitamins daily.  5) Seatbelt use advised 6) Nutrition, food safety (fish, cheese advisories, and high nitrite foods)  and exercise discussed. 7) Hospital and practice style delivering at [facility] discussed  8) Patient is asked about travel to areas at risk for the Zika virus, and counseled to avoid travel and exposure to mosquitoes or sexual partners who may have themselves been exposed to the virus. Testing is discussed, and will be ordered as appropriate.  9) [facility]birth classes at [facility] advised 10) Genetic Screening, such as with 1st Trimester Screening, cell free fetal DNA, AFP testing, and Ultrasound, as well as with amniocentesis and CVS as appropriate, is discussed with patient. She plans to have genetic testing this pregnancy.   Transvaginal US: Bedside vaginal US showed twin IUP, dichorionic- diamniotic. CRL consistent with LMP. +FHR 169 bpm/175 bpm            Recommended continuing antihypertensive medications at this time.  Recommended initiation of 81 mg  aspirin at 12 weeks.  Problem list reviewed and updated.  I discussed the assessment and treatment plan with the patient. The patient was provided an opportunity to ask questions and all were answered. The patient agreed with the plan and demonstrated an understanding of the instructions.  Adelene Idler MD [facility], [facility] 06/21/2020 9:16 AM

## 2020-06-22 LAB — COMPREHENSIVE METABOLIC PANEL
ALT: 10 IU/L (ref 0–32)
AST: 14 IU/L (ref 0–40)
Albumin/Globulin Ratio: 1.6 (ref 1.2–2.2)
Albumin: 4.6 g/dL (ref 3.9–5.0)
Alkaline Phosphatase: 60 IU/L (ref 44–121)
BUN/Creatinine Ratio: 11 (ref 9–23)
BUN: 7 mg/dL (ref 6–20)
Bilirubin Total: 0.4 mg/dL (ref 0.0–1.2)
CO2: 20 mmol/L (ref 20–29)
Calcium: 9.5 mg/dL (ref 8.7–10.2)
Chloride: 102 mmol/L (ref 96–106)
Creatinine, Ser: 0.64 mg/dL (ref 0.57–1.00)
Globulin, Total: 2.8 g/dL (ref 1.5–4.5)
Glucose: 87 mg/dL (ref 65–99)
Potassium: 4.2 mmol/L (ref 3.5–5.2)
Sodium: 137 mmol/L (ref 134–144)
Total Protein: 7.4 g/dL (ref 6.0–8.5)
eGFR: 123 mL/min/{1.73_m2} (ref >59)

## 2020-06-22 LAB — RPR+RH+ABO+RUB AB+AB SCR+CB...
Antibody Screen: NEGATIVE
HIV Screen 4th Generation wRfx: NONREACTIVE
Hematocrit: 37.4 % (ref 34.0–46.6)
Hemoglobin: 12.4 g/dL (ref 11.1–15.9)
Hepatitis B Surface Ag: NEGATIVE
MCH: 30.3 pg (ref 26.6–33.0)
MCHC: 33.2 g/dL (ref 31.5–35.7)
MCV: 91 fL (ref 79–97)
Platelets: 295 10*3/uL (ref 150–450)
RBC: 4.09 x10E6/uL (ref 3.77–5.28)
RDW: 12.3 % (ref 11.7–15.4)
RPR Ser Ql: NONREACTIVE
Rh Factor: POSITIVE
Rubella Antibodies, IGG: <0.9 index — ABNORMAL LOW (ref >0.99)
Varicella zoster IgG: 838 index (ref >165)
WBC: 11.1 10*3/uL — ABNORMAL HIGH (ref 3.4–10.8)

## 2020-06-22 LAB — HEMOGLOBIN A1C
Est. average glucose Bld gHb Est-mCnc: 108 mg/dL
Hgb A1c MFr Bld: 5.4 % (ref 4.8–5.6)

## 2020-06-22 LAB — HEPATITIS C ANTIBODY: Hep C Virus Ab: <0.1 s/co ratio (ref 0.0–0.9)

## 2020-06-22 NOTE — Telephone Encounter (Signed)
Spoke with patient about scheduled appointment for 06/28/20. Patient states she is fine with it. I also let patient no we have added her to schedule wait list if anything comes open earlier we will call her directly.

## 2020-06-22 NOTE — Telephone Encounter (Signed)
Hey Dr. Jean Rosenthal, you have an opening on Tuesday, 06/28/20 at 4:30 opening at this time. Would this work for you?

## 2020-06-23 LAB — MONITOR DRUG PROFILE 10(MW)
Amphetamine Scrn, Ur: NEGATIVE ng/mL
BARBITURATE SCREEN URINE: NEGATIVE ng/mL
BENZODIAZEPINE SCREEN, URINE: NEGATIVE ng/mL
CANNABINOIDS UR QL SCN: NEGATIVE ng/mL
Cocaine (Metab) Scrn, Ur: NEGATIVE ng/mL
Creatinine(Crt), U: 253.3 mg/dL (ref 20.0–300.0)
Methadone Screen, Urine: NEGATIVE ng/mL
OXYCODONE+OXYMORPHONE UR QL SCN: NEGATIVE ng/mL
Opiate Scrn, Ur: NEGATIVE ng/mL
Ph of Urine: 8.1 (ref 4.5–8.9)
Phencyclidine Qn, Ur: NEGATIVE ng/mL
Propoxyphene Scrn, Ur: NEGATIVE ng/mL

## 2020-06-23 LAB — CERVICOVAGINAL ANCILLARY ONLY
Chlamydia: NEGATIVE
Comment: NEGATIVE
Comment: NEGATIVE
Comment: NORMAL
Neisseria Gonorrhea: NEGATIVE
Trichomonas: NEGATIVE

## 2020-06-25 LAB — URINE CULTURE

## 2020-06-28 ENCOUNTER — Other Ambulatory Visit: Payer: Self-pay

## 2020-06-28 ENCOUNTER — Encounter: Payer: Self-pay | Admitting: Obstetrics and Gynecology

## 2020-06-28 ENCOUNTER — Ambulatory Visit (INDEPENDENT_AMBULATORY_CARE_PROVIDER_SITE_OTHER): Payer: Managed Care, Other (non HMO) | Admitting: Obstetrics and Gynecology

## 2020-06-28 VITALS — BP 142/72 | Wt 244.0 lb

## 2020-06-28 DIAGNOSIS — O10919 Unspecified pre-existing hypertension complicating pregnancy, unspecified trimester: Secondary | ICD-10-CM | POA: Insufficient documentation

## 2020-06-28 DIAGNOSIS — O0991 Supervision of high risk pregnancy, unspecified, first trimester: Secondary | ICD-10-CM

## 2020-06-28 DIAGNOSIS — O99210 Obesity complicating pregnancy, unspecified trimester: Secondary | ICD-10-CM | POA: Insufficient documentation

## 2020-06-28 DIAGNOSIS — E66812 Obesity, class 2: Secondary | ICD-10-CM | POA: Insufficient documentation

## 2020-06-28 DIAGNOSIS — O99211 Obesity complicating pregnancy, first trimester: Secondary | ICD-10-CM

## 2020-06-28 DIAGNOSIS — E669 Obesity, unspecified: Secondary | ICD-10-CM | POA: Insufficient documentation

## 2020-06-28 DIAGNOSIS — Z3A09 9 weeks gestation of pregnancy: Secondary | ICD-10-CM

## 2020-06-28 DIAGNOSIS — O30041 Twin pregnancy, dichorionic/diamniotic, first trimester: Secondary | ICD-10-CM

## 2020-06-28 NOTE — Progress Notes (Signed)
Routine Prenatal Care Visit  Subjective  Samantha Glass is a 30 y.o. G1P0 at [redacted]w[redacted]d being seen today for ongoing prenatal care.  She is currently monitored for the following issues for this high-risk pregnancy and has Essential hypertension; Depression, major, single episode, complete remission (HCC); Obesity (BMI 30.0-34.9); IDA (iron deficiency anemia); Annual physical exam; Petechiae; BMI 35.0-35.9,adult; Elevated C-reactive protein (CRP); Pigmented purpuric dermatosis; Hypokalemia; Symptomatic cholelithiasis; Umbilical hernia without obstruction and without gangrene; Mouth lesion; Supervision of high risk pregnancy, antepartum; Dichorionic diamniotic twin pregnancy in first trimester; Chronic hypertension affecting pregnancy; and Obesity affecting pregnancy on their problem list.  ----------------------------------------------------------------------------------- Patient reports spotting for a few days, not so much today.    . Vag. Bleeding: Scant.  Movement: Absent. Leaking Fluid denies.  ----------------------------------------------------------------------------------- The following portions of the patient's history were reviewed and updated as appropriate: allergies, current medications, past family history, past medical history, past social history, past surgical history and problem list. Problem list updated.  Objective  Blood pressure (!) 142/72, weight 244 lb (110.7 kg), last menstrual period 04/23/2020. Pregravid weight 245 lb (111.1 kg) Total Weight Gain -1 lb (-0.454 kg) Urinalysis: Urine Protein    Urine Glucose    Fetal Status: Fetal Heart Rate (bpm): 181/182 ([place])   Movement: Absent     General:  Alert, oriented and cooperative. Patient is in no acute distress.  Skin: Skin is warm and dry. No rash noted.   Cardiovascular: Normal heart rate noted  Respiratory: Normal respiratory effort, no problems with respiration noted  Abdomen: Soft, gravid, appropriate for gestational  age.       Pelvic:  Cervical exam deferred        Extremities: Normal range of motion.     Mental Status: Normal mood and affect. Normal behavior. Normal judgment and thought content.   Bedside transabdominal/transvaginal ultrasound: Di-di twin gestation measuring [redacted]w[redacted]d, +cardiac activity. Small SCH (0.6 x 1.0 cm)  Female chaperone present for pelvic exam:   Assessment   30 y.o. G1P0 at [redacted]w[redacted]d by  01/28/2021, by Last Menstrual Period presenting for routine prenatal visit  Plan   pregnancy 1 Problems (from 06/21/20 to present)     Problem Noted Resolved   Chronic hypertension affecting pregnancy 06/28/2020 by Conard Novak, MD No   Obesity affecting pregnancy 06/28/2020 by Conard Novak, MD No   Supervision of high risk pregnancy, antepartum 06/21/2020 by Natale Milch, MD No   Overview Signed 06/21/2020 10:12 AM by Natale Milch, MD     Nursing Staff Provider  Office Location  [facility] Dating    Language  English Anatomy US    Flu Vaccine   Genetic Screen  NIPS:   TDaP vaccine    Hgb A1C or  GTT Early : Third trimester :   Covid  vaccinated   LAB RESULTS   Rhogam   Blood Type     Feeding Plan  Antibody    Contraception  Rubella    Circumcision  RPR     Pediatrician   HBsAg     Support Person  HIV    Prenatal Classes  Varicella     GBS  (For PCN allergy, check sensitivities)   BTL Consent     VBAC Consent  Pap  2021 NIL    Hgb Electro      CF      SMA        Diamniotic, Dichorionic pregnancy Chronic hypertension- on labetalol and Norvasc  Preterm labor symptoms and general obstetric precautions including but not limited to vaginal bleeding, contractions, leaking of fluid and fetal movement were reviewed in detail with the patient. Please refer to After Visit Summary for other counseling recommendations.   - reassurance provided.  Discussed bleeding precautions.  Return in about 2 weeks (around 07/12/2020) for Routine Prenatal  Appointment.   Thomasene Mohair, MD, [facility], [facility] 06/28/2020 5:19 PM

## 2020-06-30 ENCOUNTER — Other Ambulatory Visit: Payer: Self-pay | Admitting: Obstetrics and Gynecology

## 2020-06-30 ENCOUNTER — Other Ambulatory Visit: Payer: Self-pay | Admitting: Internal Medicine

## 2020-06-30 DIAGNOSIS — O10919 Unspecified pre-existing hypertension complicating pregnancy, unspecified trimester: Secondary | ICD-10-CM

## 2020-06-30 DIAGNOSIS — O0991 Supervision of high risk pregnancy, unspecified, first trimester: Secondary | ICD-10-CM

## 2020-06-30 DIAGNOSIS — O30041 Twin pregnancy, dichorionic/diamniotic, first trimester: Secondary | ICD-10-CM

## 2020-07-05 ENCOUNTER — Encounter: Payer: Managed Care, Other (non HMO) | Admitting: Obstetrics and Gynecology

## 2020-07-15 ENCOUNTER — Other Ambulatory Visit: Payer: Self-pay

## 2020-07-15 ENCOUNTER — Encounter: Payer: Self-pay | Admitting: Obstetrics and Gynecology

## 2020-07-15 ENCOUNTER — Ambulatory Visit (INDEPENDENT_AMBULATORY_CARE_PROVIDER_SITE_OTHER): Payer: Managed Care, Other (non HMO) | Admitting: Obstetrics and Gynecology

## 2020-07-15 VITALS — BP 130/80 | Wt 244.0 lb

## 2020-07-15 DIAGNOSIS — O30041 Twin pregnancy, dichorionic/diamniotic, first trimester: Secondary | ICD-10-CM

## 2020-07-15 DIAGNOSIS — Z3A11 11 weeks gestation of pregnancy: Secondary | ICD-10-CM

## 2020-07-15 DIAGNOSIS — O0990 Supervision of high risk pregnancy, unspecified, unspecified trimester: Secondary | ICD-10-CM

## 2020-07-15 DIAGNOSIS — O99211 Obesity complicating pregnancy, first trimester: Secondary | ICD-10-CM

## 2020-07-15 DIAGNOSIS — O10919 Unspecified pre-existing hypertension complicating pregnancy, unspecified trimester: Secondary | ICD-10-CM

## 2020-07-15 LAB — POCT URINALYSIS DIPSTICK OB: Glucose, UA: NEGATIVE

## 2020-07-15 NOTE — Addendum Note (Signed)
Addended by: Cornelius Moras D on: 07/15/2020 11:44 AM   Modules accepted: Orders

## 2020-07-15 NOTE — Progress Notes (Signed)
Routine Prenatal Care Visit  Subjective  Samantha Glass is a 30 y.o. G1P0 at [redacted]w[redacted]d being seen today for ongoing prenatal care.  She is currently monitored for the following issues for this high-risk pregnancy and has Essential hypertension; Depression, major, single episode, complete remission (HCC); Obesity (BMI 30.0-34.9); IDA (iron deficiency anemia); Annual physical exam; Petechiae; BMI 35.0-35.9,adult; Elevated C-reactive protein (CRP); Pigmented purpuric dermatosis; Hypokalemia; Symptomatic cholelithiasis; Umbilical hernia without obstruction and without gangrene; Mouth lesion; Supervision of high risk pregnancy, antepartum; Dichorionic diamniotic twin pregnancy in first trimester; Chronic hypertension affecting pregnancy; and Obesity affecting pregnancy on their problem list.  ----------------------------------------------------------------------------------- Patient reports no complaints.  (Very light spotting, no cramping)  . Vag. Bleeding: Scant.  Movement: Absent. Leaking Fluid denies.  ----------------------------------------------------------------------------------- The following portions of the patient's history were reviewed and updated as appropriate: allergies, current medications, past family history, past medical history, past social history, past surgical history and problem list. Problem list updated.  Objective  Blood pressure 130/80, weight 244 lb (110.7 kg), last menstrual period 04/23/2020. Pregravid weight 245 lb (111.1 kg) Total Weight Gain -1 lb (-0.454 kg) Urinalysis: Urine Protein    Urine Glucose    Fetal Status: Fetal Heart Rate (bpm): 172/167   Movement: Absent     General:  Alert, oriented and cooperative. Patient is in no acute distress.  Skin: Skin is warm and dry. No rash noted.   Cardiovascular: Normal heart rate noted  Respiratory: Normal respiratory effort, no problems with respiration noted  Abdomen: Soft, gravid, appropriate for gestational age.  Pain/Pressure: Present     Pelvic:  Cervical exam deferred        Extremities: Normal range of motion.     Mental Status: Normal mood and affect. Normal behavior. Normal judgment and thought content.   BSUS: Di/di twins with +FCA. No SCH.  Assessment   31 y.o. G1P0 at [redacted]w[redacted]d by  01/28/2021, by Last Menstrual Period presenting for routine prenatal visit  Plan   pregnancy 1 Problems (from 06/21/20 to present)     Problem Noted Resolved   Chronic hypertension affecting pregnancy 06/28/2020 by Conard Novak, MD No   Obesity affecting pregnancy 06/28/2020 by Conard Novak, MD No   Supervision of high risk pregnancy, antepartum 06/21/2020 by Natale Milch, MD No   Overview Signed 06/21/2020 10:12 AM by Natale Milch, MD     Nursing Staff Provider  Office Location  [facility] Dating    Language  English Anatomy US    Flu Vaccine   Genetic Screen  NIPS:   TDaP vaccine    Hgb A1C or  GTT Early : Third trimester :   Covid  vaccinated   LAB RESULTS   Rhogam   Blood Type     Feeding Plan  Antibody    Contraception  Rubella    Circumcision  RPR     Pediatrician   HBsAg     Support Person  HIV    Prenatal Classes  Varicella     GBS  (For PCN allergy, check sensitivities)   BTL Consent     VBAC Consent  Pap  2021 NIL    Hgb Electro      CF      SMA        Diamniotic, Dichorionic pregnancy Chronic hypertension- on labetalol and Norvasc           Preterm labor symptoms and general obstetric precautions including but not limited to vaginal bleeding, contractions, leaking of  fluid and fetal movement were reviewed in detail with the patient. Please refer to After Visit Summary for other counseling recommendations.   Return in about 3 weeks (around 08/05/2020) for Routine Prenatal Appointment.   Thomasene Mohair, MD, [facility], [facility] 07/15/2020 11:41 AM

## 2020-07-22 ENCOUNTER — Other Ambulatory Visit: Payer: Self-pay | Admitting: Obstetrics and Gynecology

## 2020-07-22 ENCOUNTER — Other Ambulatory Visit: Payer: Self-pay | Admitting: Obstetrics

## 2020-07-22 ENCOUNTER — Ambulatory Visit: Payer: Managed Care, Other (non HMO) | Attending: Obstetrics and Gynecology

## 2020-07-22 ENCOUNTER — Ambulatory Visit: Payer: Managed Care, Other (non HMO)

## 2020-07-22 ENCOUNTER — Other Ambulatory Visit: Payer: Self-pay | Admitting: *Deleted

## 2020-07-22 ENCOUNTER — Ambulatory Visit (HOSPITAL_BASED_OUTPATIENT_CLINIC_OR_DEPARTMENT_OTHER): Payer: Managed Care, Other (non HMO) | Admitting: Obstetrics

## 2020-07-22 ENCOUNTER — Other Ambulatory Visit: Payer: Self-pay

## 2020-07-22 ENCOUNTER — Ambulatory Visit: Payer: Managed Care, Other (non HMO) | Admitting: *Deleted

## 2020-07-22 VITALS — BP 129/87 | HR 97

## 2020-07-22 DIAGNOSIS — O321XX1 Maternal care for breech presentation, fetus 1: Secondary | ICD-10-CM

## 2020-07-22 DIAGNOSIS — O30041 Twin pregnancy, dichorionic/diamniotic, first trimester: Secondary | ICD-10-CM | POA: Insufficient documentation

## 2020-07-22 DIAGNOSIS — O99211 Obesity complicating pregnancy, first trimester: Secondary | ICD-10-CM | POA: Diagnosis not present

## 2020-07-22 DIAGNOSIS — O10911 Unspecified pre-existing hypertension complicating pregnancy, first trimester: Secondary | ICD-10-CM

## 2020-07-22 DIAGNOSIS — O0990 Supervision of high risk pregnancy, unspecified, unspecified trimester: Secondary | ICD-10-CM | POA: Diagnosis present

## 2020-07-22 DIAGNOSIS — O10011 Pre-existing essential hypertension complicating pregnancy, first trimester: Secondary | ICD-10-CM

## 2020-07-22 DIAGNOSIS — O321XX2 Maternal care for breech presentation, fetus 2: Secondary | ICD-10-CM

## 2020-07-22 DIAGNOSIS — Z3A12 12 weeks gestation of pregnancy: Secondary | ICD-10-CM

## 2020-07-22 DIAGNOSIS — O10919 Unspecified pre-existing hypertension complicating pregnancy, unspecified trimester: Secondary | ICD-10-CM

## 2020-07-22 DIAGNOSIS — Z363 Encounter for antenatal screening for malformations: Secondary | ICD-10-CM | POA: Diagnosis not present

## 2020-07-22 DIAGNOSIS — Z3A13 13 weeks gestation of pregnancy: Secondary | ICD-10-CM

## 2020-07-22 DIAGNOSIS — E669 Obesity, unspecified: Secondary | ICD-10-CM

## 2020-07-22 NOTE — Progress Notes (Signed)
MFM Consult Note  Samantha Glass was seen due to a spontaneously conceived twin pregnancy.  She reports a history of chronic hypertension that was diagnosed 8 years ago.  She is currently treated with Norvasc 5 mg at bedtime and labetalol 100 mg twice a day.  She denies any problems in her current pregnancy.  A thick dividing membrane was noted separating the two fetuses on today's exam, indicating that these are dichorionic, diamniotic twins.  The fetal biometry measurements obtained for both twin A and twin B are consistent with an EDC of January 25, 2021.  There was normal amniotic fluid noted around both fetuses.  During our consultation today the following were discussed:  Dichorionic/diamniotic twin pregnancy  The management of dichorionic twins was discussed.    She was advised that management of twin pregnancies will involve frequent ultrasound exams to assess the fetal growth and amniotic fluid level. We will continue to follow her with monthly growth ultrasounds.   Delivery for uncomplicated dichorionic twins should occur at around 38 weeks.  The increased risk of preeclampsia, gestational diabetes, and preterm birth/labor associated with twin pregnancies was discussed.  She was advised that she will continue to be followed closely to assess for these conditions.   As pregnancies with multiple gestations are at increased risk for developing preeclampsia, the fact that she has chronic hypertension probably further increases her risk of developing preeclampsia.  Therefore, she was advised to take 2 tablets of baby aspirin (81 mg each) to decrease her risk of developing preeclampsia.    The patient had the Panorama cell free DNA test drawn in our office today.  She was advised that the cell free DNA test will inform her regarding the zygosity of the twin gestation in addition to providing information regarding the fetal risks of having trisomy 34, 10, and 41.  Chronic hypertension  in pregnancy  The implications and management of chronic hypertension in pregnancy was discussed.   The patient was advised that should her blood pressures be elevated later in her pregnancy, the dosage of her antihypertensive medications may need to be increased.    The increased risk of superimposed preeclampsia, an indicated preterm delivery, and possible fetal growth restriction due to chronic hypertension in pregnancy was discussed.   Due to chronic hypertension, weekly fetal testing should be started at around 32 weeks.    She understands that delivery prior to 38 weeks may be indicated should she develop superimposed preeclampsia.  A detailed fetal anatomy scan has been scheduled in our office at around 19 weeks.    At the end of the consultation, the patient stated that all of her questions had been answered to her complete satisfaction.    Thank you for referring this patient for a Maternal-Fetal Medicine consultation.    A total of 30 minutes was spent counseling and coordinating the care for this patient.  Greater than 50% of the time was spent in direct face-to-face contact.

## 2020-08-03 ENCOUNTER — Telehealth: Payer: Self-pay | Admitting: Obstetrics and Gynecology

## 2020-08-03 NOTE — Telephone Encounter (Signed)
  We spoke with Ms. Samantha Glass to review the results of Panorama NIPS through the laboratory [facility] that was low-risk for fetal aneuploidies. We reviewed that these results showed a less than 1 in 10,000 risk for trisomies 18 and 13, and 1 in 4,000 risk for Down syndrome (trisomy 21).  As this is a twin gestation, zygosity testing was performed and showed this to be a dizygotic pregnancy (fraternal twins), with both babies being female.    We reviewed that while this testing identifies 94-99% of pregnancies with trisomy 90, trisomy 22, and trisomy 47, and >70% of cases of sex chromosome aneuploidies, it is NOT diagnostic. A positive test result requires confirmation by CVS or amniocentesis, and a negative test result does not rule out a fetal chromosome abnormality. She also understands that this testing does not identify all genetic conditions.  If screening for open neural tube defects is desired, we would recommend a maternal serum AFP only in the second trimester to be drawn through her OB.  We may be reached at [phone] with any questions.   Cherly Anderson, MS, CGC

## 2020-08-05 ENCOUNTER — Ambulatory Visit (INDEPENDENT_AMBULATORY_CARE_PROVIDER_SITE_OTHER): Payer: Managed Care, Other (non HMO) | Admitting: Obstetrics and Gynecology

## 2020-08-05 ENCOUNTER — Encounter: Payer: Self-pay | Admitting: Obstetrics and Gynecology

## 2020-08-05 ENCOUNTER — Other Ambulatory Visit: Payer: Self-pay

## 2020-08-05 VITALS — BP 126/82 | Wt 249.0 lb

## 2020-08-05 DIAGNOSIS — O10919 Unspecified pre-existing hypertension complicating pregnancy, unspecified trimester: Secondary | ICD-10-CM

## 2020-08-05 DIAGNOSIS — Z3A14 14 weeks gestation of pregnancy: Secondary | ICD-10-CM

## 2020-08-05 DIAGNOSIS — O99212 Obesity complicating pregnancy, second trimester: Secondary | ICD-10-CM

## 2020-08-05 DIAGNOSIS — O0992 Supervision of high risk pregnancy, unspecified, second trimester: Secondary | ICD-10-CM

## 2020-08-05 DIAGNOSIS — O30042 Twin pregnancy, dichorionic/diamniotic, second trimester: Secondary | ICD-10-CM

## 2020-08-05 NOTE — Progress Notes (Signed)
Routine Prenatal Care Visit  Subjective  Samantha Glass is a 30 y.o. G1P0 at [redacted]w[redacted]d being seen today for ongoing prenatal care.  She is currently monitored for the following issues for this high-risk pregnancy and has Essential hypertension; Depression, major, single episode, complete remission (HCC); Obesity (BMI 30.0-34.9); IDA (iron deficiency anemia); Annual physical exam; Petechiae; BMI 35.0-35.9,adult; Elevated C-reactive protein (CRP); Pigmented purpuric dermatosis; Hypokalemia; Symptomatic cholelithiasis; Umbilical hernia without obstruction and without gangrene; Mouth lesion; Supervision of high risk pregnancy, antepartum; Dichorionic diamniotic twin pregnancy in first trimester; Chronic hypertension affecting pregnancy; and Obesity affecting pregnancy on their problem list.  ----------------------------------------------------------------------------------- Patient reports no complaints.    . Vag. Bleeding: None.  Movement: Absent. Leaking Fluid denies.  ----------------------------------------------------------------------------------- The following portions of the patient's history were reviewed and updated as appropriate: allergies, current medications, past family history, past medical history, past social history, past surgical history and problem list. Problem list updated.  Objective  Blood pressure 126/82, weight 249 lb (112.9 kg), last menstrual period 04/23/2020. Pregravid weight 245 lb (111.1 kg) Total Weight Gain 4 lb (1.814 kg) Urinalysis: Urine Protein    Urine Glucose    Fetal Status: Fetal Heart Rate (bpm): 158/158 ([place])   Movement: Absent     General:  Alert, oriented and cooperative. Patient is in no acute distress.  Skin: Skin is warm and dry. No rash noted.   Cardiovascular: Normal heart rate noted  Respiratory: Normal respiratory effort, no problems with respiration noted  Abdomen: Soft, gravid, appropriate for gestational age. Pain/Pressure: Absent      Pelvic:  Cervical exam deferred        Extremities: Normal range of motion.     Mental Status: Normal mood and affect. Normal behavior. Normal judgment and thought content.   Assessment   30 y.o. G1P0 at [redacted]w[redacted]d by  01/28/2021, by Last Menstrual Period presenting for routine prenatal visit  Plan   pregnancy 1 Problems (from 06/21/20 to present)     Problem Noted Resolved   Chronic hypertension affecting pregnancy 06/28/2020 by Conard Novak, MD No   Obesity affecting pregnancy 06/28/2020 by Conard Novak, MD No   Supervision of high risk pregnancy, antepartum 06/21/2020 by Natale Milch, MD No   Overview Addendum 07/22/2020 11:24 PM by Natale Milch, MD     Nursing Staff Provider  Office Location  [facility] Dating   LMP  Language  English Anatomy US    Flu Vaccine   Genetic Screen  NIPS:   TDaP vaccine    Hgb A1C or  GTT Early : hgba1c 5.4 Third trimester :   Covid  vaccinated   LAB RESULTS   Rhogam   Blood Type   A+  Feeding Plan  Antibody  negative  Contraception  Rubella  non immune  Circumcision  RPR  nonreactive  Pediatrician   HBsAg  nonreactive  Support Person  HIV  nonreactive  Prenatal Classes  Varicella  immune    GBS  (For PCN allergy, check sensitivities)   BTL Consent     VBAC Consent  Pap  2021 NIL    Hgb Electro      CF      SMA        Diamniotic, Dichorionic pregnancy Chronic hypertension- on labetalol and Norvasc          Dichorionic diamniotic twin pregnancy in first trimester 06/21/2020 by Natale Milch, MD No   Overview Signed 06/21/2020 10:09 AM by Natale Milch, MD  Dichorionic Diamniotic Twin Pregnancy Plan  Mom should take 60-120mg  of elemental iron a day as well as 1mg  of folic acid  18-20 wks:  Anatomy Ultrasound 20-Delivery: Growth Ultrasounds every 2-4 weeks** 36 wks: Initiate NSTs (initiate earlier if indicated)  Delivery No complications: 38 0/7 - 38 6/7 Isolated IUGR: 36 0/7- 37 6/7 IUGR  with abnormal dopplers, hypertension, etc:  32 0/7 - 34 6/7            Preterm labor symptoms and general obstetric precautions including but not limited to vaginal bleeding, contractions, leaking of fluid and fetal movement were reviewed in detail with the patient. Please refer to After Visit Summary for other counseling recommendations.   - taking bASA x 2 - anatomy scan scheduled with MFM  - UPC ratio today  Return in about 4 weeks (around 09/02/2020) for Routine Prenatal Appointment.   11/02/2020, MD, Thomas[facility]air [facility], [facility] 08/05/2020 11:16 AM

## 2020-08-16 ENCOUNTER — Other Ambulatory Visit: Payer: Self-pay | Admitting: Obstetrics and Gynecology

## 2020-08-16 DIAGNOSIS — I10 Essential (primary) hypertension: Secondary | ICD-10-CM

## 2020-08-17 LAB — PROTEIN / CREATININE RATIO, URINE
Creatinine, Urine: 32.6 mg/dL
Protein, Ur: 13.8 mg/dL
Protein/Creat Ratio: 423 mg/g creat — ABNORMAL HIGH (ref 0–200)

## 2020-08-19 ENCOUNTER — Other Ambulatory Visit: Payer: Self-pay | Admitting: Obstetrics and Gynecology

## 2020-08-19 DIAGNOSIS — O10919 Unspecified pre-existing hypertension complicating pregnancy, unspecified trimester: Secondary | ICD-10-CM

## 2020-08-19 DIAGNOSIS — I10 Essential (primary) hypertension: Secondary | ICD-10-CM

## 2020-08-19 MED ORDER — AMLODIPINE BESYLATE 5 MG PO TABS: 5.0000 mg | ORAL_TABLET | Freq: Every day | ORAL | 1 refills | Status: DC

## 2020-08-25 ENCOUNTER — Other Ambulatory Visit: Payer: Self-pay

## 2020-08-25 ENCOUNTER — Encounter: Payer: Self-pay | Admitting: Obstetrics and Gynecology

## 2020-08-25 ENCOUNTER — Ambulatory Visit (INDEPENDENT_AMBULATORY_CARE_PROVIDER_SITE_OTHER): Payer: Managed Care, Other (non HMO) | Admitting: Obstetrics and Gynecology

## 2020-08-25 VITALS — BP 117/80 | Wt 253.0 lb

## 2020-08-25 DIAGNOSIS — O0990 Supervision of high risk pregnancy, unspecified, unspecified trimester: Secondary | ICD-10-CM

## 2020-08-25 DIAGNOSIS — O99212 Obesity complicating pregnancy, second trimester: Secondary | ICD-10-CM

## 2020-08-25 DIAGNOSIS — O1210 Gestational proteinuria, unspecified trimester: Secondary | ICD-10-CM | POA: Insufficient documentation

## 2020-08-25 DIAGNOSIS — O1212 Gestational proteinuria, second trimester: Secondary | ICD-10-CM

## 2020-08-25 DIAGNOSIS — O30042 Twin pregnancy, dichorionic/diamniotic, second trimester: Secondary | ICD-10-CM

## 2020-08-25 DIAGNOSIS — O10919 Unspecified pre-existing hypertension complicating pregnancy, unspecified trimester: Secondary | ICD-10-CM

## 2020-08-25 DIAGNOSIS — O0992 Supervision of high risk pregnancy, unspecified, second trimester: Secondary | ICD-10-CM

## 2020-08-25 DIAGNOSIS — Z3A17 17 weeks gestation of pregnancy: Secondary | ICD-10-CM

## 2020-08-25 LAB — POCT URINALYSIS DIPSTICK OB
Glucose, UA: NEGATIVE
POC,PROTEIN,UA: NEGATIVE

## 2020-08-25 NOTE — Progress Notes (Signed)
Routine Prenatal Care Visit  Subjective  Samantha Glass is a 30 y.o. G1P0 at [redacted]w[redacted]d being seen today for ongoing prenatal care.  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  ----------------------------------------------------------------------------------- Patient reports no complaints.   Contractions: Not present. Vag. Bleeding: None.  Movement: Absent. Leaking Fluid denies.  ----------------------------------------------------------------------------------- The following portions of the patient's history were reviewed and updated as appropriate: allergies, current medications, past family history, past medical history, past social history, past surgical history and problem list. Problem list updated.  Objective  Blood pressure 117/80, weight 253 lb (114.8 kg), last menstrual period 04/23/2020. Pregravid weight 245 lb (111.1 kg) Total Weight Gain 8 lb (3.629 kg) Urinalysis: Urine Protein Negative  Urine Glucose Negative  Fetal Status: Fetal Heart Rate (bpm): 163/155 ([place])   Movement: Absent     General:  Alert, oriented and cooperative. Patient is in no acute distress.  Skin: Skin is warm and dry. No rash noted.   Cardiovascular: Normal heart rate noted  Respiratory: Normal respiratory effort, no problems with respiration noted  Abdomen: Soft,  gravid, appropriate for gestational age. Pain/Pressure: Absent     Pelvic:  Cervical exam deferred        Extremities: Normal range of motion.     Mental Status: Normal mood and affect. Normal behavior. Normal judgment and thought content.   Assessment   30 y.o. G1P0 at [redacted]w[redacted]d by  01/28/2021, by Last Menstrual Period presenting for routine prenatal visit  Plan   pregnancy 1 Problems (from 06/21/20 to present)     Problem Noted Resolved   Proteinuria affecting pregnancy 08/25/2020 by Samantha Novak, MD No   Chronic hypertension affecting pregnancy 06/28/2020 by Samantha Novak, MD No   Obesity affecting pregnancy 06/28/2020 by Samantha Novak, MD No   Supervision of high risk pregnancy, antepartum 06/21/2020 by Samantha Milch, MD No   Overview Addendum 07/22/2020 11:24 PM by Samantha Milch, MD     Nursing Staff Provider  Office Location  [facility] Dating   LMP  Language  English Anatomy US    Flu Vaccine   Genetic Screen  NIPS:   TDaP vaccine    Hgb A1C or  GTT Early : hgba1c 5.4 Third trimester :   Covid  vaccinated   LAB RESULTS   Rhogam   Blood Type   A+  Feeding Plan  Antibody  negative  Contraception  Rubella  non immune  Circumcision  RPR  nonreactive  Pediatrician   HBsAg  nonreactive  Support Person  HIV  nonreactive  Prenatal Classes  Varicella  immune    GBS  (For PCN allergy, check sensitivities)   BTL Consent     VBAC Consent  Pap  2021 NIL    Hgb Electro      CF      SMA        Diamniotic, Dichorionic pregnancy Chronic hypertension- on labetalol and Norvasc          Dichorionic diamniotic twin pregnancy in first trimester 06/21/2020 by Samantha Milch,  MD No   Overview Signed 06/21/2020 10:09 AM by Samantha Milch, MD    Dichorionic Diamniotic Twin Pregnancy Plan  Mom should take 60-120mg  of elemental iron a day as well as 1mg  of folic acid  18-20 wks:  Anatomy Ultrasound 20-Delivery: Growth Ultrasounds every 2-4  weeks** 36 wks: Initiate NSTs (initiate earlier if indicated)  Delivery No complications: 38 0/7 - 38 6/7 Isolated IUGR: 36 0/7- 37 6/7 IUGR with abnormal dopplers, hypertension, etc:  32 0/7 - 34 6/7            Preterm labor symptoms and general obstetric precautions including but not limited to vaginal bleeding, contractions, leaking of fluid and fetal movement were reviewed in detail with the patient. Please refer to After Visit Summary for other counseling recommendations.   Patient wanted to discuss baseline proteinuria.  Discussed its significance in the setting of hypertension at baseline. She see MFM next week and will await to see if they have any different opinion on mild proteinuria in the setting of chronic hypertension in pregnancy.   Return in about 4 weeks (around 09/22/2020) for ROB.  09/24/2020, MD, Thomasene Mohair [facility], [facility] 08/25/2020 5:54 PM

## 2020-08-30 ENCOUNTER — Ambulatory Visit: Payer: Managed Care, Other (non HMO)

## 2020-09-02 ENCOUNTER — Ambulatory Visit: Payer: Managed Care, Other (non HMO) | Admitting: *Deleted

## 2020-09-02 ENCOUNTER — Ambulatory Visit: Payer: Managed Care, Other (non HMO) | Attending: Obstetrics and Gynecology

## 2020-09-02 ENCOUNTER — Ambulatory Visit (HOSPITAL_BASED_OUTPATIENT_CLINIC_OR_DEPARTMENT_OTHER): Payer: Managed Care, Other (non HMO) | Admitting: Maternal & Fetal Medicine

## 2020-09-02 ENCOUNTER — Other Ambulatory Visit: Payer: Self-pay

## 2020-09-02 ENCOUNTER — Encounter: Payer: Self-pay | Admitting: *Deleted

## 2020-09-02 VITALS — BP 124/81 | HR 89

## 2020-09-02 DIAGNOSIS — O09523 Supervision of elderly multigravida, third trimester: Secondary | ICD-10-CM

## 2020-09-02 DIAGNOSIS — O30041 Twin pregnancy, dichorionic/diamniotic, first trimester: Secondary | ICD-10-CM

## 2020-09-02 DIAGNOSIS — O0990 Supervision of high risk pregnancy, unspecified, unspecified trimester: Secondary | ICD-10-CM

## 2020-09-02 DIAGNOSIS — Z3A31 31 weeks gestation of pregnancy: Secondary | ICD-10-CM | POA: Diagnosis not present

## 2020-09-02 DIAGNOSIS — O99212 Obesity complicating pregnancy, second trimester: Secondary | ICD-10-CM | POA: Insufficient documentation

## 2020-09-02 DIAGNOSIS — O10919 Unspecified pre-existing hypertension complicating pregnancy, unspecified trimester: Secondary | ICD-10-CM | POA: Insufficient documentation

## 2020-09-02 DIAGNOSIS — E669 Obesity, unspecified: Secondary | ICD-10-CM | POA: Diagnosis not present

## 2020-09-02 DIAGNOSIS — O1212 Gestational proteinuria, second trimester: Secondary | ICD-10-CM

## 2020-09-02 DIAGNOSIS — O30043 Twin pregnancy, dichorionic/diamniotic, third trimester: Secondary | ICD-10-CM | POA: Diagnosis not present

## 2020-09-02 DIAGNOSIS — Z3689 Encounter for other specified antenatal screening: Secondary | ICD-10-CM

## 2020-09-02 DIAGNOSIS — O30042 Twin pregnancy, dichorionic/diamniotic, second trimester: Secondary | ICD-10-CM | POA: Insufficient documentation

## 2020-09-02 DIAGNOSIS — O10012 Pre-existing essential hypertension complicating pregnancy, second trimester: Secondary | ICD-10-CM | POA: Diagnosis not present

## 2020-09-02 DIAGNOSIS — Z3A19 19 weeks gestation of pregnancy: Secondary | ICD-10-CM

## 2020-09-02 NOTE — Progress Notes (Signed)
MFM consultation  Diamniotic Dichorionic pregnancy at an advanced gestational age of [redacted] weeks.  Samantha Glass is a G1P20 who is here for a detailed examination for DiDi twin pregnancy. Normal anatomy with good amniotic fluid and fetal movement was observed in Twin A and B. Suboptimal views of the fetal anatomy were obtained secondary to fetal position and advance gestational age.  Twin discordance of 2%.  I reviewed the normal nature of today's ultrasound. Samantha Glass conveyed that she is taking low dose aspirin for preeclampsia prevention, as she was further along in gestation at the time of her prenatal care.  She had a low risk NIPS.  We reviewed the sonographic findings and limitations of ultrasound. The potential risks associated with a twin gestation were discussed.  This discussion included a review of the increased risk of miscarriages, anomalies, preterm labor, and/or delivery, malpresentation, delivery via cesarean section, gestational diabetes, and/or preeclampsia.  With regards to fetal risks, there is an increased risk for fetal growth restriction of one or both twins, preterm labor, and associated morbidity, and intrauterine fetal demise.    We recommend growth scans every 4 weeks starting at 24 weeks with the initation of weekly antenatal testing in the form of twice weekly NST or weekly BPP should abnormal fetal growth or intertwin discordance of greater than 20-25% is noted.   Samantha Glass also has known chronic hypertension Labetalol and Norvasc daily. She has occasional headaches that resolve with tyelenol.  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 Consider a 24 urine to further evaluate. Her CBC and CMP were normal.  Her blood pressure was 127/81 mmHg.   Recommendations: Continue serial growth every 4 weeks.   Following counseling, all questions were addressed.    I spent 30 minutes with > 50% in face to face visit.  Novella Olive, MD.

## 2020-09-06 ENCOUNTER — Other Ambulatory Visit: Payer: Self-pay | Admitting: *Deleted

## 2020-09-06 ENCOUNTER — Other Ambulatory Visit: Payer: Self-pay

## 2020-09-06 ENCOUNTER — Ambulatory Visit (INDEPENDENT_AMBULATORY_CARE_PROVIDER_SITE_OTHER): Payer: Managed Care, Other (non HMO) | Admitting: Obstetrics and Gynecology

## 2020-09-06 ENCOUNTER — Encounter: Payer: Self-pay | Admitting: Obstetrics and Gynecology

## 2020-09-06 VITALS — BP 120/72 | Wt 259.8 lb

## 2020-09-06 DIAGNOSIS — O10912 Unspecified pre-existing hypertension complicating pregnancy, second trimester: Secondary | ICD-10-CM

## 2020-09-06 DIAGNOSIS — O30042 Twin pregnancy, dichorionic/diamniotic, second trimester: Secondary | ICD-10-CM

## 2020-09-06 DIAGNOSIS — Z6834 Body mass index (BMI) 34.0-34.9, adult: Secondary | ICD-10-CM

## 2020-09-06 DIAGNOSIS — O0990 Supervision of high risk pregnancy, unspecified, unspecified trimester: Secondary | ICD-10-CM

## 2020-09-06 NOTE — Progress Notes (Signed)
Routine Prenatal Care Visit  Subjective  Samantha Glass is a 29 y.o. G1P0 at [redacted]w[redacted]d being seen today for ongoing prenatal care.  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  ----------------------------------------------------------------------------------- Patient reports no complaints.   Contractions: Not present. Vag. Bleeding: None.  Movement: Present. Denies leaking of fluid.  ----------------------------------------------------------------------------------- The following portions of the patient's history were reviewed and updated as appropriate: allergies, current medications, past family history, past medical history, past social history, past surgical history and problem list. Problem list updated.   Objective  Blood pressure 120/72, weight 259 lb 12.8 oz (117.8 kg), last menstrual period 04/23/2020. Pregravid weight 245 lb (111.1 kg) Total Weight Gain 14 lb 12.8 oz (6.713 kg) Urinalysis:      Fetal Status:     Movement: Present     General:  Alert, oriented and cooperative. Patient is in no acute distress.  Skin: Skin is warm and dry. No rash noted.   Cardiovascular: Normal heart rate noted  Respiratory: Normal respiratory effort, no problems with respiration noted  Abdomen: Soft, gravid, appropriate for gestational age.  Pain/Pressure: Absent     Pelvic:  Cervical exam deferred        Extremities: Normal range of motion.     Mental Status: Normal mood and affect. Normal behavior. Normal judgment and thought content.     Assessment   30 y.o. G1P0 at [redacted]w[redacted]d by  01/28/2021, by Last Menstrual Period presenting for routine prenatal visit  Plan   pregnancy 1 Problems (from 06/21/20 to present)     Problem Noted Resolved   Proteinuria affecting pregnancy 08/25/2020 by Conard Novak, MD No   Chronic hypertension affecting pregnancy 06/28/2020 by Conard Novak, MD No   Obesity affecting pregnancy 06/28/2020 by Conard Novak, MD No   Supervision of high risk pregnancy, antepartum 06/21/2020 by Natale Milch, MD No   Overview Addendum 09/06/2020  4:32 PM by Natale Milch, MD     Nursing Staff Provider  Office Location  [facility] Dating   LMP  Language  English Anatomy US  complete  Flu Vaccine   Genetic Screen  NIPS: normal xx/xx  TDaP vaccine    Hgb A1C or  GTT Early : hgba1c 5.4 Third trimester :   Covid  vaccinated   LAB RESULTS   Rhogam  Not needed Blood Type   A+  Feeding Plan Breast Antibody  negative  Contraception Undecided- considering OCP Rubella  non immune  Circumcision  RPR  nonreactive  Pediatrician   HBsAg  nonreactive  Support Person Husband- Robert HIV  nonreactive  Prenatal Classes discussed Varicella  immune    GBS  (For PCN allergy, check sensitivities)   BTL Consent n/a    VBAC Consent n/a Pap  2021 NIL    Hgb Electro      CF      SMA        Diamniotic, Dichorionic pregnancy Chronic hypertension- on labetalol and Norvasc          Twin gestation, dichorionic/diamniotic (  two placentae, two amniotic sacs) 06/21/2020 by Natale Milch, MD No   Overview Signed 06/21/2020 10:09 AM by Natale Milch, MD    Dichorionic Diamniotic Twin Pregnancy Plan  Mom should take 60-120mg  of elemental iron a day as well as 1mg  of folic acid  18-20 wks:   Anatomy Ultrasound 20-Delivery: Growth Ultrasounds every 2-4 weeks** 36 wks: Initiate NSTs (initiate earlier if indicated)  Delivery No complications: 38 0/7 - 38 6/7 Isolated IUGR: 36 0/7- 37 6/7 IUGR with abnormal dopplers, hypertension, etc:  32 0/7 - 34 6/7           Discussed prenatal classes  Gestational age appropriate obstetric precautions including but not limited to vaginal bleeding, contractions, leaking of fluid and fetal movement were reviewed in detail with the patient.    Return in about 4 weeks (around 10/04/2020) for ROB in person with MD.  12/04/2020 MD [facility], [facility] 09/06/2020, 4:16 PM

## 2020-09-06 NOTE — Patient Instructions (Addendum)
Mom should take 60-120mg  of elemental iron a day as well as 1mg  of folic acid   Moms on call- good postpartum resource with schedule suggestions  [email or web address]- Daily activities section for exercises/ stretches during pregnancy   Second Trimester of Pregnancy The second trimester of pregnancy is from week 13 through week 27. This is months 4 through 6 of pregnancy. The second trimester is often a time when you feel your best. Your body has adjusted to being pregnant, and you begin to feel better physically. During the second trimester: Morning sickness has lessened or stopped completely. You may have more energy. You may have an increase in appetite. The second trimester is also a time when the unborn baby (fetus) is growing rapidly. At the end of the sixth month, the fetus may be up to 12 inches long and weigh about 1 pounds. You will likely begin to feel the baby move (quickening) between 16 and 20 weeks of pregnancy. Body changes during your second trimester Your body continues to go through many changes during your second trimester. The changes vary and generally return to normal after the baby is born. Physical changes Your weight will continue to increase. You will notice your lower abdomen bulging out. You may begin to get stretch marks on your hips, abdomen, and breasts. Your breasts will continue to grow and to become tender. Dark spots or blotches (chloasma or mask of pregnancy) may develop on your face. A dark line from your belly button to the pubic area (linea nigra) may appear. You may have changes in your hair. These can include thickening of your hair, rapid growth, and changes in texture. Some people also have hair loss during or after pregnancy, or hair that feels dry or thin. Health changes You may develop headaches. You may have heartburn. You may develop constipation. You may develop hemorrhoids or swollen, bulging veins (varicose veins). Your gums may bleed  and may be sensitive to brushing and flossing. You may urinate more often because the fetus is pressing on your bladder. You may have back pain. This is caused by: Weight gain. Pregnancy hormones that are relaxing the joints in your pelvis. A shift in weight and the muscles that support your balance. Follow these instructions at home: Medicines Follow your health care provider's instructions regarding medicine use. Specific medicines may be either safe or unsafe to take during pregnancy. Do not take any medicines unless approved by your health care provider. Take a prenatal vitamin that contains at least 600 micrograms (mcg) of folic acid. Eating and drinking Eat a healthy diet that includes fresh fruits and vegetables, whole grains, good sources of protein such as meat, eggs, or tofu, and low-fat dairy products. Avoid raw meat and unpasteurized juice, milk, and cheese. These carry germs that can harm you and your baby. You may need to take these actions to prevent or treat constipation: Drink enough fluid to keep your urine pale yellow. Eat foods that are high in fiber, such as beans, whole grains, and fresh fruits and vegetables. Limit foods that are high in fat and processed sugars, such as fried or sweet foods. Activity Exercise only as directed by your health care provider. Most people can continue their usual exercise routine during pregnancy. Try to exercise for 30 minutes at least 5 days a week. Stop exercising if you develop contractions in your uterus. Stop exercising if you develop pain or cramping in the lower abdomen or lower back. Avoid exercising if it is very  hot or humid or if you are at a high altitude. Avoid heavy lifting. If you choose to, you may have sex unless your health care provider tells you not to. Relieving pain and discomfort Wear a supportive bra to prevent discomfort from breast tenderness. Take warm sitz baths to soothe any pain or discomfort caused by  hemorrhoids. Use hemorrhoid cream if your health care provider approves. Rest with your legs raised (elevated) if you have leg cramps or low back pain. If you develop varicose veins: Wear support hose as told by your health care provider. Elevate your feet for 15 minutes, 3-4 times a day. Limit salt in your diet. Safety Wear your seat belt at all times when driving or riding in a car. Talk with your health care provider if someone is verbally or physically abusive to you. Lifestyle Do not use hot tubs, steam rooms, or saunas. Do not douche. Do not use tampons or scented sanitary pads. Avoid cat litter boxes and soil used by cats. These carry germs that can cause birth defects in the baby and possibly loss of the fetus by miscarriage or stillbirth. Do not use herbal remedies, alcohol, illegal drugs, or medicines that are not approved by your health care provider. Chemicals in these products can harm your baby. Do not use any products that contain nicotine or tobacco, such as cigarettes, e-cigarettes, and chewing tobacco. If you need help quitting, ask your health care provider. General instructions During a routine prenatal visit, your health care provider will do a physical exam and other tests. He or she will also discuss your overall health. Keep all follow-up visits. This is important. Ask your health care provider for a referral to a local prenatal education class. Ask for help if you have counseling or nutritional needs during pregnancy. Your health care provider can offer advice or refer you to specialists for help with various needs. Where to find more information American Pregnancy Association: americanpregnancy.org [employer]ologists: https://www.todd-brady.net/ Office on [employer] [facility]: [employer].nz Contact a health care provider if you have: A headache that does not go away when you take medicine. Vision changes or you see  spots in front of your eyes. Mild pelvic cramps, pelvic pressure, or nagging pain in the abdominal area. Persistent nausea, vomiting, or diarrhea. A bad-smelling vaginal discharge or foul-smelling urine. Pain when you urinate. Sudden or extreme swelling of your face, hands, ankles, feet, or legs. A fever. Get help right away if you: Have fluid leaking from your vagina. Have spotting or bleeding from your vagina. Have severe abdominal cramping or pain. Have difficulty breathing. Have chest pain. Have fainting spells. Have not felt your baby move for the time period told by your health care provider. Have new or increased pain, swelling, or redness in an arm or leg. Summary The second trimester of pregnancy is from week 13 through week 27 (months 4 through 6). Do not use herbal remedies, alcohol, illegal drugs, or medicines that are not approved by your health care provider. Chemicals in these products can harm your baby. Exercise only as directed by your health care provider. Most people can continue their usual exercise routine during pregnancy. Keep all follow-up visits. This is important. This information is not intended to replace advice given to you by your health care provider. Make sure you discuss any questions you have with your health care provider. Document Revised: 05/27/2019 Document Reviewed: 04/02/2019 Elsevier Patient Education  2022 [employer].

## 2020-09-19 ENCOUNTER — Telehealth: Payer: Self-pay

## 2020-09-19 NOTE — Telephone Encounter (Signed)
Pt called after  hour nurse 09/18/20 9:23 pm c/o 21wks; preg c twins; abd pain down low; no vag bleeding or leakage of fluid.  After hour nurse adv pt to lie down and rest and to call if sxs worsened.  Called pt to f/u; pt states things seem to be back to  normal today; still has cramping off and on and at diff places; thinks it may be round ligament pain.  Adv it could be and it is normal to have it through out the whole preg; may take 2 extra str tylenol q6h while awake and apply heat.  Also adv cramping is normal through out the whole preg as long as it doesn't stop her in her tracks or double her over.  Pt reassured and appreciative.

## 2020-09-22 ENCOUNTER — Other Ambulatory Visit: Payer: Self-pay

## 2020-09-22 ENCOUNTER — Ambulatory Visit (INDEPENDENT_AMBULATORY_CARE_PROVIDER_SITE_OTHER): Payer: Managed Care, Other (non HMO) | Admitting: Obstetrics and Gynecology

## 2020-09-22 ENCOUNTER — Encounter: Payer: Self-pay | Admitting: Obstetrics and Gynecology

## 2020-09-22 VITALS — BP 122/80 | Wt 262.0 lb

## 2020-09-22 DIAGNOSIS — O99212 Obesity complicating pregnancy, second trimester: Secondary | ICD-10-CM

## 2020-09-22 DIAGNOSIS — O10919 Unspecified pre-existing hypertension complicating pregnancy, unspecified trimester: Secondary | ICD-10-CM

## 2020-09-22 DIAGNOSIS — Z3A21 21 weeks gestation of pregnancy: Secondary | ICD-10-CM

## 2020-09-22 DIAGNOSIS — O30042 Twin pregnancy, dichorionic/diamniotic, second trimester: Secondary | ICD-10-CM

## 2020-09-22 DIAGNOSIS — O1212 Gestational proteinuria, second trimester: Secondary | ICD-10-CM

## 2020-09-22 DIAGNOSIS — O0992 Supervision of high risk pregnancy, unspecified, second trimester: Secondary | ICD-10-CM

## 2020-09-22 NOTE — Progress Notes (Signed)
Routine Prenatal Care Visit  Subjective  Samantha Glass is a 30 y.o. G1P0 at [redacted]w[redacted]d being seen today for ongoing prenatal care.  She is currently monitored for the following issues for this high-risk pregnancy and has Essential hypertension; Depression, major, single episode, complete remission (HCC); Obesity (BMI 30.0-34.9); IDA (iron deficiency anemia); Annual physical exam; Petechiae; BMI 35.0-35.9,adult; Elevated C-reactive protein (CRP); Pigmented purpuric dermatosis; Hypokalemia; Symptomatic cholelithiasis; Umbilical hernia without obstruction and without gangrene; Mouth lesion; Supervision of high risk pregnancy, antepartum; Twin gestation, dichorionic/diamniotic (two placentae, two amniotic sacs); Chronic hypertension affecting pregnancy; Obesity affecting pregnancy; and Proteinuria affecting pregnancy on their problem list.  ----------------------------------------------------------------------------------- Patient reports mild bilateral lower abdominal cramping for the past 3-4 days, intermittent, nearly period-like. Perhaps worse at night. Denies vaginal bleeding, increased discharge, urinary symptoms, vaginal itching, burning, irritation, GI symptoms. Denies fever and chills.  Contractions: Not present. Vag. Bleeding: None.  Movement: Present. Leaking Fluid denies.  ----------------------------------------------------------------------------------- The following portions of the patient's history were reviewed and updated as appropriate: allergies, current medications, past family history, past medical history, past social history, past surgical history and problem list. Problem list updated.  Objective  Blood pressure 122/80, weight 262 lb (118.8 kg), last menstrual period 04/23/2020. Pregravid weight 245 lb (111.1 kg) Total Weight Gain 17 lb (7.711 kg) Urinalysis: Urine Protein    Urine Glucose    Fetal Status: Fetal Heart Rate (bpm): 160/165   Movement: Present     General:   Alert, oriented and cooperative. Patient is in no acute distress.  Skin: Skin is warm and dry. No rash noted.   Cardiovascular: Normal heart rate noted  Respiratory: Normal respiratory effort, no problems with respiration noted  Abdomen: Soft, gravid, appropriate for gestational age. Pain/Pressure: Present     Pelvic:  Cervical exam deferred  (offered to patient, but declines)      Extremities: Normal range of motion.     Mental Status: Normal mood and affect. Normal behavior. Normal judgment and thought content.   Assessment   30 y.o. G1P0 at [redacted]w[redacted]d by  01/28/2021, by Last Menstrual Period presenting for work-in prenatal visit  Plan   pregnancy 1 Problems (from 06/21/20 to present)     Problem Noted Resolved   Proteinuria affecting pregnancy 08/25/2020 by Conard Novak, MD No   Chronic hypertension affecting pregnancy 06/28/2020 by Conard Novak, MD No   Obesity affecting pregnancy 06/28/2020 by Conard Novak, MD No   Supervision of high risk pregnancy, antepartum 06/21/2020 by Natale Milch, MD No   Overview Addendum 09/06/2020  4:32 PM by Natale Milch, MD     Nursing Staff Provider  Office Location  [facility] Dating   LMP  Language  English Anatomy US  complete  Flu Vaccine   Genetic Screen  NIPS: normal xx/xx  TDaP vaccine    Hgb A1C or  GTT Early : hgba1c 5.4 Third trimester :   Covid  vaccinated   LAB RESULTS   Rhogam  Not needed Blood Type   A+  Feeding Plan Breast Antibody  negative  Contraception Undecided- considering OCP Rubella  non immune  Circumcision  RPR  nonreactive  Pediatrician   HBsAg  nonreactive  Support Person Husband- Robert HIV  nonreactive  Prenatal Classes discussed Varicella  immune    GBS  (For PCN allergy, check sensitivities)   BTL Consent n/a    VBAC Consent n/a Pap  2021 NIL    Hgb Electro      CF  SMA        Diamniotic, Dichorionic pregnancy Chronic hypertension- on labetalol and Norvasc          Twin  gestation, dichorionic/diamniotic (two placentae, two amniotic sacs) 06/21/2020 by Natale Milch, MD No   Overview Signed 06/21/2020 10:09 AM by Natale Milch, MD    Dichorionic Diamniotic Twin Pregnancy Plan  Mom should take 60-120mg  of elemental iron a day as well as 1mg  of folic acid  18-20 wks:  Anatomy Ultrasound 20-Delivery: Growth Ultrasounds every 2-4 weeks** 36 wks: Initiate NSTs (initiate earlier if indicated)  Delivery No complications: 38 0/7 - 38 6/7 Isolated IUGR: 36 0/7- 37 6/7 IUGR with abnormal dopplers, hypertension, etc:  32 0/7 - 34 6/7            Preterm labor symptoms and general obstetric precautions including but not limited to vaginal bleeding, contractions, leaking of fluid and fetal movement were reviewed in detail with the patient. Please refer to After Visit Summary for other counseling recommendations.   - discussed precautions for preterm labor, cervical insufficiency.  Declines pelvic today. Ultrasound performed and no evidence of shortened cervix from a transabdominal approach.  Discussed limitations of transabdominal approach with patient.  Her symptoms are more consistent with round ligament pain at this time.  However, I did offer further examination which she declined today.  Return for Keep previously scheduled appts.   8/7, MD, Thomasene Mohair OB/GYN, [facility] 09/22/2020 2:47 PM

## 2020-09-30 ENCOUNTER — Ambulatory Visit: Payer: Managed Care, Other (non HMO) | Admitting: *Deleted

## 2020-09-30 ENCOUNTER — Ambulatory Visit: Payer: Managed Care, Other (non HMO) | Attending: Maternal & Fetal Medicine

## 2020-09-30 ENCOUNTER — Other Ambulatory Visit: Payer: Self-pay

## 2020-09-30 ENCOUNTER — Other Ambulatory Visit: Payer: Self-pay | Admitting: *Deleted

## 2020-09-30 ENCOUNTER — Encounter: Payer: Self-pay | Admitting: *Deleted

## 2020-09-30 VITALS — BP 127/85 | HR 91

## 2020-09-30 DIAGNOSIS — O99212 Obesity complicating pregnancy, second trimester: Secondary | ICD-10-CM

## 2020-09-30 DIAGNOSIS — O10912 Unspecified pre-existing hypertension complicating pregnancy, second trimester: Secondary | ICD-10-CM

## 2020-09-30 DIAGNOSIS — O10919 Unspecified pre-existing hypertension complicating pregnancy, unspecified trimester: Secondary | ICD-10-CM | POA: Diagnosis present

## 2020-09-30 DIAGNOSIS — Z362 Encounter for other antenatal screening follow-up: Secondary | ICD-10-CM

## 2020-09-30 DIAGNOSIS — Z3A23 23 weeks gestation of pregnancy: Secondary | ICD-10-CM

## 2020-09-30 DIAGNOSIS — O30042 Twin pregnancy, dichorionic/diamniotic, second trimester: Secondary | ICD-10-CM

## 2020-09-30 DIAGNOSIS — O10012 Pre-existing essential hypertension complicating pregnancy, second trimester: Secondary | ICD-10-CM | POA: Diagnosis not present

## 2020-09-30 DIAGNOSIS — Z6834 Body mass index (BMI) 34.0-34.9, adult: Secondary | ICD-10-CM

## 2020-09-30 DIAGNOSIS — O0990 Supervision of high risk pregnancy, unspecified, unspecified trimester: Secondary | ICD-10-CM | POA: Diagnosis present

## 2020-10-05 ENCOUNTER — Observation Stay
Admission: EM | Admit: 2020-10-05 | Discharge: 2020-10-05 | Disposition: A | Discharge status: Home / Self Care | Payer: Managed Care, Other (non HMO) | Attending: Obstetrics and Gynecology | Admitting: Obstetrics and Gynecology

## 2020-10-05 ENCOUNTER — Encounter: Payer: Managed Care, Other (non HMO) | Admitting: Obstetrics & Gynecology

## 2020-10-05 DIAGNOSIS — Z3A23 23 weeks gestation of pregnancy: Secondary | ICD-10-CM

## 2020-10-05 DIAGNOSIS — O26859 Spotting complicating pregnancy, unspecified trimester: Principal | ICD-10-CM | POA: Insufficient documentation

## 2020-10-05 DIAGNOSIS — O10919 Unspecified pre-existing hypertension complicating pregnancy, unspecified trimester: Secondary | ICD-10-CM

## 2020-10-05 DIAGNOSIS — O26899 Other specified pregnancy related conditions, unspecified trimester: Secondary | ICD-10-CM | POA: Diagnosis present

## 2020-10-05 DIAGNOSIS — O26892 Other specified pregnancy related conditions, second trimester: Secondary | ICD-10-CM

## 2020-10-05 DIAGNOSIS — O0990 Supervision of high risk pregnancy, unspecified, unspecified trimester: Secondary | ICD-10-CM

## 2020-10-05 DIAGNOSIS — N939 Abnormal uterine and vaginal bleeding, unspecified: Secondary | ICD-10-CM

## 2020-10-05 DIAGNOSIS — O30042 Twin pregnancy, dichorionic/diamniotic, second trimester: Secondary | ICD-10-CM

## 2020-10-05 DIAGNOSIS — O99212 Obesity complicating pregnancy, second trimester: Secondary | ICD-10-CM

## 2020-10-05 DIAGNOSIS — N898 Other specified noninflammatory disorders of vagina: Secondary | ICD-10-CM | POA: Diagnosis present

## 2020-10-05 LAB — RUPTURE OF MEMBRANE (ROM)PLUS: Rom Plus: NEGATIVE

## 2020-10-05 LAB — FETAL FIBRONECTIN: Fetal Fibronectin: NEGATIVE

## 2020-10-05 NOTE — OB Triage Note (Signed)
Pt reports some cramping the last two weeks, but has been seen in the MD office since. Today she has noticed some light tinge pink bleeding. She placed a panty liner on at lunch and hasn't changed it. Blood spot on liner is about the size of a lemon and pale pink.

## 2020-10-05 NOTE — Discharge Summary (Signed)
Physician Discharge Summary  Patient ID: Samantha Glass MRN: [id number] DOB/AGE: [birth year] 30 y.o.  Admit date: 10/05/2020 Discharge date: 10/05/2020  Admission Diagnoses:  Discharge Diagnoses:  Active Problems:   Vaginal discharge during pregnancy  Discharged Condition: good  Hospital Course: Patient presented to labor and delivery with complaints of vaginal spotting and cramping and pressure. Reports that she had been wearing a pad since lunch with a blood spot on liner is about the size of a lemon and pale pink. she reports she has noticed a sensation of swelling in her vulva and a sensation of fullness. She has had some discomfort with intercourse. She has noticed purple spots which were present on the labia and then resolved before pregnancy but have recently returned.   ROM plus and FFN were negative. No signs of labor. Vulvar varicosities present. Discussed that these might be the source of discomfort and spotting when rubbing on clothing. Discussed supportive care with abdominal belt and compression socks. Follow up as planned in the office.   Consults: None  Significant Diagnostic Studies: labs: ROM Plus negative FFN: negative  Treatments: none  Discharge Exam: Blood pressure 131/80, pulse 86, temperature 98.9 F (37.2 C), temperature source Oral, resp. rate 16, last menstrual period 04/23/2020, SpO2 99 %. General appearance: alert, cooperative, and appears stated age Cardio: regular rate and rhythm GI: soft, non-tender; bowel sounds normal; no masses,  no organomegaly Skin: Skin color, texture, turgor normal. No rashes or lesions Neurologic: Grossly normal   External: Normal appearing vulva. Vulvar varicosities noted Speculum examination: Normal appearing cervix. No blood in the vaginal vault. No discharge.   SVE: closed, long, high  Baby A NST: 150 bpm baseline, moderate variability, 10x10 accelerations, no decelerations. Baby B NST: 150 bpm baseline, moderate  variability, 10x10 accelerations, no decelerations. Tocometer : quiet, no contractions   Disposition: Discharge disposition: 01-Home or Self Care       Allergies as of 10/05/2020   No Known Allergies      Medication List     STOP taking these medications    ondansetron 4 MG disintegrating tablet Commonly known as: Zofran ODT   promethazine 25 MG tablet Commonly known as: PHENERGAN       TAKE these medications    amLODipine 5 MG tablet Commonly known as: NORVASC Take 1 tablet (5 mg total) by mouth daily.   buPROPion 300 MG 24 hr tablet Commonly known as: WELLBUTRIN XL TAKE 1 TABLET BY MOUTH EVERY DAY   doxylamine (Sleep) 25 MG tablet Commonly known as: UNISOM Take by mouth at bedtime as needed.   labetalol 200 MG tablet Commonly known as: NORMODYNE Take 1 tablet (200 mg total) by mouth 2 (two) times daily. What changed: additional instructions   prenatal multivitamin Tabs tablet Take 1 tablet by mouth daily at 12 noon.   PROBIOTIC PO Take 1 capsule by mouth daily.   pyridOXINE 25 MG tablet Commonly known as: VITAMIN B-6 Take 25 mg by mouth daily.   Vitron-C 65-125 MG Tabs Generic drug: Iron-Vitamin C Take 1 tablet by mouth at bedtime.         Signed: Natale Milch 10/05/2020, 8:38 PM

## 2020-10-06 ENCOUNTER — Encounter: Payer: Managed Care, Other (non HMO) | Admitting: Obstetrics and Gynecology

## 2020-10-17 ENCOUNTER — Ambulatory Visit (INDEPENDENT_AMBULATORY_CARE_PROVIDER_SITE_OTHER): Payer: Managed Care, Other (non HMO) | Admitting: Obstetrics and Gynecology

## 2020-10-17 ENCOUNTER — Encounter: Payer: Self-pay | Admitting: Obstetrics and Gynecology

## 2020-10-17 ENCOUNTER — Other Ambulatory Visit: Payer: Self-pay

## 2020-10-17 VITALS — BP 122/78 | Wt 271.0 lb

## 2020-10-17 DIAGNOSIS — Z131 Encounter for screening for diabetes mellitus: Secondary | ICD-10-CM

## 2020-10-17 DIAGNOSIS — O30042 Twin pregnancy, dichorionic/diamniotic, second trimester: Secondary | ICD-10-CM

## 2020-10-17 DIAGNOSIS — Z113 Encounter for screening for infections with a predominantly sexual mode of transmission: Secondary | ICD-10-CM

## 2020-10-17 DIAGNOSIS — O10919 Unspecified pre-existing hypertension complicating pregnancy, unspecified trimester: Secondary | ICD-10-CM

## 2020-10-17 DIAGNOSIS — Z3A25 25 weeks gestation of pregnancy: Secondary | ICD-10-CM

## 2020-10-17 DIAGNOSIS — O0990 Supervision of high risk pregnancy, unspecified, unspecified trimester: Secondary | ICD-10-CM

## 2020-10-17 DIAGNOSIS — O99212 Obesity complicating pregnancy, second trimester: Secondary | ICD-10-CM

## 2020-10-17 NOTE — Progress Notes (Signed)
Routine Prenatal Care Visit  Subjective  Samantha Glass is a 30 y.o. G1P0 at [redacted]w[redacted]d being seen today for ongoing prenatal care.  She is currently monitored for the following issues for this high-risk pregnancy and has Essential hypertension; Depression, major, single episode, complete remission (HCC); Obesity (BMI 30.0-34.9); IDA (iron deficiency anemia); Annual physical exam; Petechiae; BMI 35.0-35.9,adult; Elevated C-reactive protein (CRP); Pigmented purpuric dermatosis; Hypokalemia; Symptomatic cholelithiasis; Umbilical hernia without obstruction and without gangrene; Mouth lesion; Supervision of high risk pregnancy, antepartum; Twin gestation, dichorionic/diamniotic (two placentae, two amniotic sacs); Chronic hypertension affecting pregnancy; Obesity affecting pregnancy; Proteinuria affecting pregnancy; and Vaginal discharge during pregnancy on their problem list.  ----------------------------------------------------------------------------------- Patient reports no complaints.   Contractions: Not present. Vag. Bleeding: None.  Movement: Present. Leaking Fluid denies.  ----------------------------------------------------------------------------------- The following portions of the patient's history were reviewed and updated as appropriate: allergies, current medications, past family history, past medical history, past social history, past surgical history and problem list. Problem list updated.  Objective  Blood pressure 122/78, weight 271 lb (122.9 kg), last menstrual period 04/23/2020. Pregravid weight 245 lb (111.1 kg) Total Weight Gain 26 lb (11.8 kg) Urinalysis: Urine Protein    Urine Glucose    Fetal Status: Fetal Heart Rate (bpm): 153/142   Movement: Present     General:  Alert, oriented and cooperative. Patient is in no acute distress.  Skin: Skin is warm and dry. No rash noted.   Cardiovascular: Normal heart rate noted  Respiratory: Normal respiratory effort, no problems with  respiration noted  Abdomen: Soft, gravid, appropriate for gestational age. Pain/Pressure: Absent     Pelvic:  Cervical exam deferred        Extremities: Normal range of motion.  Edema: None  Mental Status: Normal mood and affect. Normal behavior. Normal judgment and thought content.   FHR by bedside u/s  Assessment   30 y.o. G1P0 at [redacted]w[redacted]d by  01/28/2021, by Last Menstrual Period presenting for routine prenatal visit  Plan   pregnancy 1 Problems (from 06/21/20 to present)     Problem Noted Resolved   Proteinuria affecting pregnancy 08/25/2020 by Conard Novak, MD No   Chronic hypertension affecting pregnancy 06/28/2020 by Conard Novak, MD No   Obesity affecting pregnancy 06/28/2020 by Conard Novak, MD No   Supervision of high risk pregnancy, antepartum 06/21/2020 by Natale Milch, MD No   Overview Addendum 09/06/2020  4:32 PM by Natale Milch, MD     Nursing Staff Provider  Office Location  [facility] Dating   LMP  Language  English Anatomy US  complete  Flu Vaccine   Genetic Screen  NIPS: normal xx/xx  TDaP vaccine    Hgb A1C or  GTT Early : hgba1c 5.4 Third trimester :   Covid  vaccinated   LAB RESULTS   Rhogam  Not needed Blood Type   A+  Feeding Plan Breast Antibody  negative  Contraception Undecided- considering OCP Rubella  non immune  Circumcision  RPR  nonreactive  Pediatrician   HBsAg  nonreactive  Support Person Husband- Robert HIV  nonreactive  Prenatal Classes discussed Varicella  immune    GBS  (For PCN allergy, check sensitivities)   BTL Consent n/a    VBAC Consent n/a Pap  2021 NIL    Hgb Electro      CF      SMA        Diamniotic, Dichorionic pregnancy Chronic hypertension- on labetalol and Norvasc  Twin gestation, dichorionic/diamniotic (two placentae, two amniotic sacs) 06/21/2020 by Natale Milch, MD No   Overview Signed 06/21/2020 10:09 AM by Natale Milch, MD    Dichorionic Diamniotic Twin  Pregnancy Plan  Mom should take 60-120mg  of elemental iron a day as well as 1mg  of folic acid  18-20 wks:  Anatomy Ultrasound 20-Delivery: Growth Ultrasounds every 2-4 weeks** 36 wks: Initiate NSTs (initiate earlier if indicated)  Delivery No complications: 38 0/7 - 38 6/7 Isolated IUGR: 36 0/7- 37 6/7 IUGR with abnormal dopplers, hypertension, etc:  32 0/7 - 34 6/7            Preterm labor symptoms and general obstetric precautions including but not limited to vaginal bleeding, contractions, leaking of fluid and fetal movement were reviewed in detail with the patient. Please refer to After Visit Summary for other counseling recommendations.   Return in about 2 weeks (around 10/31/2020) for 28 wk labs with routine prenatal (may make multiple 2 week appts).  11/02/2020, MD, Thomasene Mohair OB/GYN, [facility] 10/17/2020 12:35 PM

## 2020-10-18 ENCOUNTER — Encounter: Payer: Managed Care, Other (non HMO) | Admitting: Obstetrics and Gynecology

## 2020-10-27 ENCOUNTER — Telehealth: Payer: Self-pay | Admitting: Obstetrics and Gynecology

## 2020-10-27 DIAGNOSIS — I10 Essential (primary) hypertension: Secondary | ICD-10-CM

## 2020-10-28 ENCOUNTER — Other Ambulatory Visit: Payer: Self-pay | Admitting: Obstetrics and Gynecology

## 2020-10-28 ENCOUNTER — Ambulatory Visit: Payer: Managed Care, Other (non HMO) | Attending: Obstetrics

## 2020-10-28 ENCOUNTER — Other Ambulatory Visit: Payer: Self-pay

## 2020-10-28 ENCOUNTER — Other Ambulatory Visit: Payer: Self-pay | Admitting: *Deleted

## 2020-10-28 ENCOUNTER — Ambulatory Visit: Payer: Managed Care, Other (non HMO) | Admitting: *Deleted

## 2020-10-28 ENCOUNTER — Encounter: Payer: Self-pay | Admitting: *Deleted

## 2020-10-28 VITALS — BP 131/86 | HR 87

## 2020-10-28 DIAGNOSIS — O10912 Unspecified pre-existing hypertension complicating pregnancy, second trimester: Secondary | ICD-10-CM | POA: Diagnosis present

## 2020-10-28 DIAGNOSIS — O30042 Twin pregnancy, dichorionic/diamniotic, second trimester: Secondary | ICD-10-CM | POA: Diagnosis not present

## 2020-10-28 DIAGNOSIS — O10012 Pre-existing essential hypertension complicating pregnancy, second trimester: Secondary | ICD-10-CM

## 2020-10-28 DIAGNOSIS — Z362 Encounter for other antenatal screening follow-up: Secondary | ICD-10-CM | POA: Insufficient documentation

## 2020-10-28 DIAGNOSIS — O0990 Supervision of high risk pregnancy, unspecified, unspecified trimester: Secondary | ICD-10-CM | POA: Diagnosis present

## 2020-10-28 DIAGNOSIS — O99212 Obesity complicating pregnancy, second trimester: Secondary | ICD-10-CM | POA: Diagnosis present

## 2020-10-28 DIAGNOSIS — O10919 Unspecified pre-existing hypertension complicating pregnancy, unspecified trimester: Secondary | ICD-10-CM

## 2020-10-28 DIAGNOSIS — Z3A27 27 weeks gestation of pregnancy: Secondary | ICD-10-CM

## 2020-10-28 NOTE — Progress Notes (Signed)
error 

## 2020-10-28 NOTE — Telephone Encounter (Signed)
Refill sent today.

## 2020-10-28 NOTE — Telephone Encounter (Signed)
Patient is calling to follow up on prescription refill.

## 2020-10-31 ENCOUNTER — Other Ambulatory Visit: Payer: Self-pay | Admitting: *Deleted

## 2020-10-31 DIAGNOSIS — O30042 Twin pregnancy, dichorionic/diamniotic, second trimester: Secondary | ICD-10-CM

## 2020-10-31 DIAGNOSIS — O10912 Unspecified pre-existing hypertension complicating pregnancy, second trimester: Secondary | ICD-10-CM

## 2020-11-01 ENCOUNTER — Encounter: Payer: Self-pay | Admitting: Obstetrics and Gynecology

## 2020-11-01 ENCOUNTER — Ambulatory Visit (INDEPENDENT_AMBULATORY_CARE_PROVIDER_SITE_OTHER): Payer: Managed Care, Other (non HMO) | Admitting: Obstetrics and Gynecology

## 2020-11-01 ENCOUNTER — Other Ambulatory Visit: Payer: Self-pay | Admitting: *Deleted

## 2020-11-01 ENCOUNTER — Other Ambulatory Visit: Payer: Self-pay

## 2020-11-01 ENCOUNTER — Other Ambulatory Visit: Payer: Managed Care, Other (non HMO)

## 2020-11-01 VITALS — BP 122/70 | Ht 71.0 in | Wt 277.2 lb

## 2020-11-01 DIAGNOSIS — O0990 Supervision of high risk pregnancy, unspecified, unspecified trimester: Secondary | ICD-10-CM

## 2020-11-01 DIAGNOSIS — Z6834 Body mass index (BMI) 34.0-34.9, adult: Secondary | ICD-10-CM

## 2020-11-01 DIAGNOSIS — O10912 Unspecified pre-existing hypertension complicating pregnancy, second trimester: Secondary | ICD-10-CM

## 2020-11-01 DIAGNOSIS — Z3A27 27 weeks gestation of pregnancy: Secondary | ICD-10-CM

## 2020-11-01 DIAGNOSIS — Z131 Encounter for screening for diabetes mellitus: Secondary | ICD-10-CM

## 2020-11-01 DIAGNOSIS — O30042 Twin pregnancy, dichorionic/diamniotic, second trimester: Secondary | ICD-10-CM

## 2020-11-01 DIAGNOSIS — Z113 Encounter for screening for infections with a predominantly sexual mode of transmission: Secondary | ICD-10-CM

## 2020-11-01 DIAGNOSIS — O0993 Supervision of high risk pregnancy, unspecified, third trimester: Secondary | ICD-10-CM

## 2020-11-01 NOTE — Progress Notes (Signed)
Routine Prenatal Care Visit  Subjective  Samantha Glass is a 30 y.o. G1P0 at [redacted]w[redacted]d being seen today for ongoing prenatal care.  She is currently monitored for the following issues for this high-risk pregnancy and has Essential hypertension; Depression, major, single episode, complete remission (HCC); Obesity (BMI 30.0-34.9); IDA (iron deficiency anemia); Annual physical exam; Petechiae; BMI 35.0-35.9,adult; Elevated C-reactive protein (CRP); Pigmented purpuric dermatosis; Hypokalemia; Symptomatic cholelithiasis; Umbilical hernia without obstruction and without gangrene; Mouth lesion; Supervision of high risk pregnancy, antepartum; Twin gestation, dichorionic/diamniotic (two placentae, two amniotic sacs); Chronic hypertension affecting pregnancy; Obesity affecting pregnancy; Proteinuria affecting pregnancy; and Vaginal discharge during pregnancy on their problem list.  ----------------------------------------------------------------------------------- Patient reports no complaints.   Contractions: Not present. Vag. Bleeding: None.  Movement: Present. Denies leaking of fluid.  ----------------------------------------------------------------------------------- The following portions of the patient's history were reviewed and updated as appropriate: allergies, current medications, past family history, past medical history, past social history, past surgical history and problem list. Problem list updated.   Objective  Blood pressure 122/70, height 5\' 11"  (1.803 m), weight 277 lb 3.2 oz (125.7 kg), last menstrual period 04/20/2020. Pregravid weight 245 lb (111.1 kg) Total Weight Gain 32 lb 3.2 oz (14.6 kg) Urinalysis:      Fetal Status: Fetal Heart Rate (bpm): 154/143   Movement: Present     General:  Alert, oriented and cooperative. Patient is in no acute distress.  Skin: Skin is warm and dry. No rash noted.   Cardiovascular: Normal heart rate noted  Respiratory: Normal respiratory effort,  no problems with respiration noted  Abdomen: Soft, gravid, appropriate for gestational age. Pain/Pressure: Present     Pelvic:  Cervical exam deferred        Extremities: Normal range of motion.  Edema: None  Mental Status: Normal mood and affect. Normal behavior. Normal judgment and thought content.     Assessment   30 y.o. G1P0 at [redacted]w[redacted]d by  01/25/2021, by Last Menstrual Period presenting for routine prenatal visit  Plan   pregnancy 1 Problems (from 06/21/20 to present)     Problem Noted Resolved   Proteinuria affecting pregnancy 08/25/2020 by 08/27/2020, MD No   Chronic hypertension affecting pregnancy 06/28/2020 by 06/30/2020, MD No   Obesity affecting pregnancy 06/28/2020 by 06/30/2020, MD No   Supervision of high risk pregnancy, antepartum 06/21/2020 by 06/23/2020, MD No   Overview Addendum 09/06/2020  4:32 PM by 11/06/2020, MD     Nursing Staff Provider  Office Location  [facility] Dating   LMP  Language  English Anatomy Natale Milch  complete  Flu Vaccine   Genetic Screen  NIPS: normal xx/xx  TDaP vaccine    Hgb A1C or  GTT Early : hgba1c 5.4 Third trimester :   Covid  vaccinated   LAB RESULTS   Rhogam  Not needed Blood Type   A+  Feeding Plan Breast Antibody  negative  Contraception Undecided- considering OCP Rubella  non immune  Circumcision  RPR  nonreactive  Pediatrician   HBsAg  nonreactive  Support Person Husband- Robert HIV  nonreactive  Prenatal Classes discussed Varicella  immune    GBS  (For PCN allergy, check sensitivities)   BTL Consent n/a    VBAC Consent n/a Pap  2021 NIL    Hgb Electro      CF      SMA        Diamniotic, Dichorionic pregnancy Chronic hypertension- on labetalol and  Norvasc          Twin gestation, dichorionic/diamniotic (two placentae, two amniotic sacs) 06/21/2020 by Homero Fellers, MD No   Overview Signed 06/21/2020 10:09 AM by Homero Fellers, MD    Dichorionic Diamniotic Twin  Pregnancy Plan  Mom should take 60-120mg  of elemental iron a day as well as 1mg  of folic acid  [id number] wks:  Anatomy Ultrasound 20-Delivery: Growth Ultrasounds every 2-4 weeks** 36 wks: Initiate NSTs (initiate earlier if indicated)  Delivery No complications: 38 0/7 - 38 6/7 Isolated IUGR: 36 0/7- 37 6/7 IUGR with abnormal dopplers, hypertension, etc:  32 0/7 - 34 6/7           28 week labs today  Gestational age appropriate obstetric precautions including but not limited to vaginal bleeding, contractions, leaking of fluid and fetal movement were reviewed in detail with the patient.    Return in about 2 weeks (around 11/15/2020) for [place] with MD.  Homero Fellers MD [place] 11/01/2020, 3:33 PM

## 2020-11-02 LAB — 28 WEEK RH+PANEL
Basophils Absolute: 0 10*3/uL (ref 0.0–0.2)
Basos: 0 %
EOS (ABSOLUTE): 0.4 10*3/uL (ref 0.0–0.4)
Eos: 3 %
Gestational Diabetes Screen: 90 mg/dL (ref 70–139)
HIV Screen 4th Generation wRfx: NONREACTIVE
Hematocrit: 35.2 % (ref 34.0–46.6)
Hemoglobin: 12.1 g/dL (ref 11.1–15.9)
Immature Grans (Abs): 0.1 10*3/uL (ref 0.0–0.1)
Immature Granulocytes: 1 %
Lymphocytes Absolute: 1.9 10*3/uL (ref 0.7–3.1)
Lymphs: 14 %
MCH: 31.8 pg (ref 26.6–33.0)
MCHC: 34.4 g/dL (ref 31.5–35.7)
MCV: 93 fL (ref 79–97)
Monocytes Absolute: 0.8 10*3/uL (ref 0.1–0.9)
Monocytes: 6 %
Neutrophils Absolute: 10.4 10*3/uL — ABNORMAL HIGH (ref 1.4–7.0)
Neutrophils: 76 %
Platelets: 220 10*3/uL (ref 150–450)
RBC: 3.8 x10E6/uL (ref 3.77–5.28)
RDW: 12.2 % (ref 11.7–15.4)
RPR Ser Ql: NONREACTIVE
WBC: 13.6 10*3/uL — ABNORMAL HIGH (ref 3.4–10.8)

## 2020-11-08 ENCOUNTER — Encounter: Payer: Self-pay | Admitting: Obstetrics and Gynecology

## 2020-11-08 ENCOUNTER — Telehealth: Payer: Self-pay

## 2020-11-08 ENCOUNTER — Observation Stay
Admission: EM | Admit: 2020-11-08 | Discharge: 2020-11-08 | Disposition: A | Discharge status: Home / Self Care | Payer: Managed Care, Other (non HMO) | Attending: Obstetrics and Gynecology | Admitting: Obstetrics and Gynecology

## 2020-11-08 DIAGNOSIS — O133 Gestational [pregnancy-induced] hypertension without significant proteinuria, third trimester: Principal | ICD-10-CM | POA: Insufficient documentation

## 2020-11-08 DIAGNOSIS — O0990 Supervision of high risk pregnancy, unspecified, unspecified trimester: Secondary | ICD-10-CM

## 2020-11-08 DIAGNOSIS — O30042 Twin pregnancy, dichorionic/diamniotic, second trimester: Secondary | ICD-10-CM

## 2020-11-08 DIAGNOSIS — O169 Unspecified maternal hypertension, unspecified trimester: Secondary | ICD-10-CM | POA: Diagnosis present

## 2020-11-08 DIAGNOSIS — Z3A28 28 weeks gestation of pregnancy: Secondary | ICD-10-CM | POA: Diagnosis not present

## 2020-11-08 DIAGNOSIS — O99212 Obesity complicating pregnancy, second trimester: Secondary | ICD-10-CM

## 2020-11-08 DIAGNOSIS — O10919 Unspecified pre-existing hypertension complicating pregnancy, unspecified trimester: Secondary | ICD-10-CM

## 2020-11-08 DIAGNOSIS — O1212 Gestational proteinuria, second trimester: Secondary | ICD-10-CM

## 2020-11-08 LAB — CBC
HCT: 35.6 % — ABNORMAL LOW (ref 36.0–46.0)
Hemoglobin: 12.2 g/dL (ref 12.0–15.0)
MCH: 32 pg (ref 26.0–34.0)
MCHC: 34.3 g/dL (ref 30.0–36.0)
MCV: 93.4 fL (ref 80.0–100.0)
Platelets: 211 10*3/uL (ref 150–400)
RBC: 3.81 MIL/uL — ABNORMAL LOW (ref 3.87–5.11)
RDW: 12.8 % (ref 11.5–15.5)
WBC: 14.3 10*3/uL — ABNORMAL HIGH (ref 4.0–10.5)
nRBC: 0 % (ref 0.0–0.2)

## 2020-11-08 LAB — COMPREHENSIVE METABOLIC PANEL
ALT: 14 U/L (ref 0–44)
AST: 19 U/L (ref 15–41)
Albumin: 3.2 g/dL — ABNORMAL LOW (ref 3.5–5.0)
Alkaline Phosphatase: 81 U/L (ref 38–126)
Anion gap: 7 (ref 5–15)
BUN: 7 mg/dL (ref 6–20)
CO2: 21 mmol/L — ABNORMAL LOW (ref 22–32)
Calcium: 8.9 mg/dL (ref 8.9–10.3)
Chloride: 107 mmol/L (ref 98–111)
Creatinine, Ser: 0.54 mg/dL (ref 0.44–1.00)
GFR, Estimated: >60 mL/min (ref >60)
Glucose, Bld: 82 mg/dL (ref 70–99)
Potassium: 3.9 mmol/L (ref 3.5–5.1)
Sodium: 135 mmol/L (ref 135–145)
Total Bilirubin: 0.4 mg/dL (ref 0.3–1.2)
Total Protein: 6.4 g/dL — ABNORMAL LOW (ref 6.5–8.1)

## 2020-11-08 LAB — PROTEIN / CREATININE RATIO, URINE
Creatinine, Urine: 32 mg/dL
Total Protein, Urine: <6 mg/dL

## 2020-11-08 NOTE — Telephone Encounter (Signed)
Pt calling; is 29wks c twins; BP usually controlled by meds; today it's elevated to 139/106 in one arm and 132/100 in the other arm. What to do? [phone]  Pt states no pain in upper right side; no visual changes; has felt 'off' today.  Adv to go to L&D; [place] notified.

## 2020-11-08 NOTE — Discharge Summary (Signed)
Physician Discharge Summary  Patient ID: Samantha Glass MRN: [id number] DOB/AGE: 1990/07/27 30 y.o.  Admit date: 11/08/2020 Discharge date: 11/08/2020  Admission Diagnoses: Hypertension in pregnancy  Discharge Diagnoses:  Active Problems:   Hypertension in pregnancy   Discharged Condition: good  Hospital Course: Patient was seen and evaluated for elevated Bps at home. She reports that she has also been feeling "off" this afternoon. She has a mild headache. She denies vision changes. She denies RUQ pain. She is feeling normal fetal movement. No uterine contractions. No leakage of fluid.   Patient reports that over the last week her BP at home have been:  127/89 123/84 131/93 116/84 120/84 130/90 135/91 129/89 118/90 124/94 139/106 132/100  Blood pressures in hospital largely normal. Preeclampsia labs were sent but lab noted that their machine was broken and that it would be an additional 2 hours for the results. Since her blood pressures were largely normal she was discharge home. Plan to continue monitoring blood pressures. No change in medications at this time. We will call her to return if she has lab abnormalities.   Reactive fetal tracings  Consults: None  Significant Diagnostic Studies: labs: See EPIC  Treatments: oral hydration  Discharge Exam: Blood pressure 134/71, pulse 91, temperature 98.2 F (36.8 C), temperature source Oral, resp. rate 18, last menstrual period 04/20/2020. Exam not performed  Disposition:  Discharge disposition: 01-Home or Self Care       Allergies as of 11/08/2020   No Known Allergies      Medication List     TAKE these medications    amLODipine 5 MG tablet Commonly known as: NORVASC Take 1 tablet (5 mg total) by mouth daily.   aspirin EC 81 MG tablet Take 81 mg by mouth daily. Swallow whole.   buPROPion 300 MG 24 hr tablet Commonly known as: WELLBUTRIN XL TAKE 1 TABLET BY MOUTH EVERY DAY   labetalol 200 MG  tablet Commonly known as: NORMODYNE Take 1 tablet (200 mg total) by mouth 2 (two) times daily.   prenatal multivitamin Tabs tablet Take 1 tablet by mouth daily at 12 noon.   Vitron-C 65-125 MG Tabs Generic drug: Iron-Vitamin C Take 1 tablet by mouth at bedtime.         Signed: Natale Milch 11/08/2020, 8:46 PM

## 2020-11-08 NOTE — OB Triage Note (Addendum)
Pt discharged home in stable condition. Discharge instructions given and patient and significant other verbalize understanding.

## 2020-11-08 NOTE — OB Triage Note (Signed)
Pt Samantha Glass 30 y.o. presents to labor and delivery triage reporting increased BP taken at home and feeling "off" today. Pt is a G1P0 at [redacted]w[redacted]d pregnant with didi twins . Pt denies signs and symptons consistent with rupture of membranes or active vaginal bleeding. Pt denies contractions and states positive fetal movement. Pt denies headache, blurry vision, or RUQ pain. On assessment +2 reflexes and no clonus. External FM and TOCO applied to non-tender abdomen and assessing. Initial FHR A 150, FHR B 155 . Vital signs obtained, serial BP,  provider notified of pt.

## 2020-11-15 ENCOUNTER — Other Ambulatory Visit: Payer: Self-pay

## 2020-11-15 ENCOUNTER — Ambulatory Visit (INDEPENDENT_AMBULATORY_CARE_PROVIDER_SITE_OTHER): Payer: Managed Care, Other (non HMO) | Admitting: Obstetrics and Gynecology

## 2020-11-15 ENCOUNTER — Encounter: Payer: Self-pay | Admitting: Obstetrics and Gynecology

## 2020-11-15 VITALS — BP 130/90 | Wt 278.0 lb

## 2020-11-15 DIAGNOSIS — O30043 Twin pregnancy, dichorionic/diamniotic, third trimester: Secondary | ICD-10-CM

## 2020-11-15 DIAGNOSIS — O99213 Obesity complicating pregnancy, third trimester: Secondary | ICD-10-CM

## 2020-11-15 DIAGNOSIS — Z3A29 29 weeks gestation of pregnancy: Secondary | ICD-10-CM

## 2020-11-15 DIAGNOSIS — O10919 Unspecified pre-existing hypertension complicating pregnancy, unspecified trimester: Secondary | ICD-10-CM

## 2020-11-15 DIAGNOSIS — O0993 Supervision of high risk pregnancy, unspecified, third trimester: Secondary | ICD-10-CM

## 2020-11-15 NOTE — Progress Notes (Signed)
Routine Prenatal Care Visit  Subjective  Samantha Glass is a 30 y.o. G1P0 at [redacted]w[redacted]d being seen today for ongoing prenatal care.  She is currently monitored for the following issues for this high-risk pregnancy and has Essential hypertension; Depression, major, single episode, complete remission (HCC); Obesity (BMI 30.0-34.9); IDA (iron deficiency anemia); Annual physical exam; Petechiae; BMI 35.0-35.9,adult; Elevated C-reactive protein (CRP); Pigmented purpuric dermatosis; Hypokalemia; Symptomatic cholelithiasis; Umbilical hernia without obstruction and without gangrene; Mouth lesion; Supervision of high risk pregnancy, antepartum; Twin gestation, dichorionic/diamniotic (two placentae, two amniotic sacs); Chronic hypertension affecting pregnancy; Obesity affecting pregnancy; Proteinuria affecting pregnancy; Vaginal discharge during pregnancy; and Hypertension in pregnancy on their problem list.  ----------------------------------------------------------------------------------- Patient reports mild cramping, some mild RUQ pain, intermittent..   Contractions: Not present. Vag. Bleeding: None.  Movement: Present. Leaking Fluid denies.  ----------------------------------------------------------------------------------- The following portions of the patient's history were reviewed and updated as appropriate: allergies, current medications, past family history, past medical history, past social history, past surgical history and problem list. Problem list updated.  Objective  Blood pressure 130/90, weight 278 lb (126.1 kg), last menstrual period 04/20/2020. Pregravid weight 245 lb (111.1 kg) Total Weight Gain 33 lb (15 kg) Urinalysis: Urine Protein    Urine Glucose    Fetal Status: Fetal Heart Rate (bpm): 148/148   Movement: Present  Presentation: Vertex  General:  Alert, oriented and cooperative. Patient is in no acute distress.  Skin: Skin is warm and dry. No rash noted.   Cardiovascular: Normal  heart rate noted  Respiratory: Normal respiratory effort, no problems with respiration noted  Abdomen: Soft, gravid, appropriate for gestational age. Pain/Pressure: Absent     Pelvic:  Cervical exam deferred        Extremities: Normal range of motion.     Mental Status: Normal mood and affect. Normal behavior. Normal judgment and thought content.   Assessment   30 y.o. G1P0 at [redacted]w[redacted]d by  01/25/2021, by Last Menstrual Period presenting for routine prenatal visit  Plan   pregnancy 1 Problems (from 06/21/20 to present)     Problem Noted Resolved   Proteinuria affecting pregnancy 08/25/2020 by Conard Novak, MD No   Chronic hypertension affecting pregnancy 06/28/2020 by Conard Novak, MD No   Obesity affecting pregnancy 06/28/2020 by Conard Novak, MD No   Supervision of high risk pregnancy, antepartum 06/21/2020 by Natale Milch, MD No   Overview Addendum 09/06/2020  4:32 PM by Natale Milch, MD     Nursing Staff Provider  Office Location  [facility] Dating   LMP  Language  English Anatomy US  complete  Flu Vaccine   Genetic Screen  NIPS: normal xx/xx  TDaP vaccine    Hgb A1C or  GTT Early : hgba1c 5.4 Third trimester :   Covid  vaccinated   LAB RESULTS   Rhogam  Not needed Blood Type   A+  Feeding Plan Breast Antibody  negative  Contraception Undecided- considering OCP Rubella  non immune  Circumcision  RPR  nonreactive  Pediatrician   HBsAg  nonreactive  Support Person Husband- Robert HIV  nonreactive  Prenatal Classes discussed Varicella  immune    GBS  (For PCN allergy, check sensitivities)   BTL Consent n/a    VBAC Consent n/a Pap  2021 NIL    Hgb Electro      CF      SMA        Diamniotic, Dichorionic pregnancy Chronic hypertension- on labetalol and Norvasc  Twin gestation, dichorionic/diamniotic (two placentae, two amniotic sacs) 06/21/2020 by Homero Fellers, MD No   Overview Signed 06/21/2020 10:09 AM by Homero Fellers, MD    Dichorionic Diamniotic Twin Pregnancy Plan  Mom should take 60-120mg  of elemental iron a day as well as 1mg  of folic acid  [id number] wks:  Anatomy Ultrasound 20-Delivery: Growth Ultrasounds every 2-4 weeks** 36 wks: Initiate NSTs (initiate earlier if indicated)  Delivery No complications: 38 0/7 - 38 6/7 Isolated IUGR: 36 0/7- 37 6/7 IUGR with abnormal dopplers, hypertension, etc:  32 0/7 - 34 6/7            Preterm labor symptoms and general obstetric precautions including but not limited to vaginal bleeding, contractions, leaking of fluid and fetal movement were reviewed in detail with the patient. Please refer to After Visit Summary for other counseling recommendations.   Return in about 2 weeks (around 11/29/2020) for ROB.   Prentice Docker, MD, [facility], [facility] 11/15/2020 11:00 AM

## 2020-11-28 ENCOUNTER — Ambulatory Visit: Payer: Managed Care, Other (non HMO) | Admitting: *Deleted

## 2020-11-28 ENCOUNTER — Other Ambulatory Visit: Payer: Self-pay

## 2020-11-28 ENCOUNTER — Ambulatory Visit: Payer: Managed Care, Other (non HMO) | Attending: Obstetrics and Gynecology

## 2020-11-28 ENCOUNTER — Encounter: Payer: Self-pay | Admitting: *Deleted

## 2020-11-28 ENCOUNTER — Encounter: Payer: Self-pay | Admitting: Obstetrics and Gynecology

## 2020-11-28 VITALS — BP 137/95 | HR 91

## 2020-11-28 DIAGNOSIS — O30042 Twin pregnancy, dichorionic/diamniotic, second trimester: Secondary | ICD-10-CM | POA: Diagnosis present

## 2020-11-28 DIAGNOSIS — O10919 Unspecified pre-existing hypertension complicating pregnancy, unspecified trimester: Secondary | ICD-10-CM | POA: Insufficient documentation

## 2020-11-28 DIAGNOSIS — O10912 Unspecified pre-existing hypertension complicating pregnancy, second trimester: Secondary | ICD-10-CM | POA: Diagnosis present

## 2020-11-28 DIAGNOSIS — Z6834 Body mass index (BMI) 34.0-34.9, adult: Secondary | ICD-10-CM | POA: Diagnosis present

## 2020-11-28 DIAGNOSIS — O0990 Supervision of high risk pregnancy, unspecified, unspecified trimester: Secondary | ICD-10-CM | POA: Insufficient documentation

## 2020-11-28 DIAGNOSIS — O30043 Twin pregnancy, dichorionic/diamniotic, third trimester: Secondary | ICD-10-CM

## 2020-11-28 DIAGNOSIS — O99213 Obesity complicating pregnancy, third trimester: Secondary | ICD-10-CM

## 2020-11-28 DIAGNOSIS — Z3A31 31 weeks gestation of pregnancy: Secondary | ICD-10-CM | POA: Diagnosis not present

## 2020-11-28 DIAGNOSIS — O10013 Pre-existing essential hypertension complicating pregnancy, third trimester: Secondary | ICD-10-CM

## 2020-11-29 ENCOUNTER — Encounter: Payer: Self-pay | Admitting: Obstetrics and Gynecology

## 2020-11-29 ENCOUNTER — Ambulatory Visit (INDEPENDENT_AMBULATORY_CARE_PROVIDER_SITE_OTHER): Payer: Managed Care, Other (non HMO) | Admitting: Obstetrics and Gynecology

## 2020-11-29 VITALS — BP 140/95 | Wt 285.0 lb

## 2020-11-29 DIAGNOSIS — O30043 Twin pregnancy, dichorionic/diamniotic, third trimester: Secondary | ICD-10-CM

## 2020-11-29 DIAGNOSIS — O10919 Unspecified pre-existing hypertension complicating pregnancy, unspecified trimester: Secondary | ICD-10-CM

## 2020-11-29 DIAGNOSIS — Z3A31 31 weeks gestation of pregnancy: Secondary | ICD-10-CM

## 2020-11-29 DIAGNOSIS — O0993 Supervision of high risk pregnancy, unspecified, third trimester: Secondary | ICD-10-CM

## 2020-11-29 DIAGNOSIS — O99213 Obesity complicating pregnancy, third trimester: Secondary | ICD-10-CM

## 2020-11-29 NOTE — Progress Notes (Signed)
Routine Prenatal Care Visit  Subjective  Samantha Glass is a 30 y.o. G1P0 at [redacted]w[redacted]d being seen today for ongoing prenatal care.  She is currently monitored for the following issues for this high-risk pregnancy and has Essential hypertension; Depression, major, single episode, complete remission (HCC); Obesity (BMI 30.0-34.9); IDA (iron deficiency anemia); Annual physical exam; Petechiae; BMI 35.0-35.9,adult; Elevated C-reactive protein (CRP); Pigmented purpuric dermatosis; Hypokalemia; Symptomatic cholelithiasis; Umbilical hernia without obstruction and without gangrene; Mouth lesion; Supervision of high risk pregnancy, antepartum; Twin gestation, dichorionic/diamniotic (two placentae, two amniotic sacs); Chronic hypertension affecting pregnancy; Obesity affecting pregnancy; Proteinuria affecting pregnancy; Vaginal discharge during pregnancy; and Hypertension in pregnancy on their problem list.  ----------------------------------------------------------------------------------- Patient reports no issues. Denies HA, visual changes, and RUQ pain.   Contractions: Not present. Vag. Bleeding: None.  Movement: Present. Leaking Fluid denies.  Growth u/s normal yesterday with normal AFI. ----------------------------------------------------------------------------------- The following portions of the patient's history were reviewed and updated as appropriate: allergies, current medications, past family history, past medical history, past social history, past surgical history and problem list. Problem list updated.  Objective  Blood pressure (!) 140/95, weight 285 lb (129.3 kg), last menstrual period 04/20/2020. Pregravid weight 245 lb (111.1 kg) Total Weight Gain 40 lb (18.1 kg) Urinalysis: Urine Protein    Urine Glucose    Fetal Status: Fetal Heart Rate (bpm): 158/155   Movement: Present  Presentation: Vertex (by u/s)  General:  Alert, oriented and cooperative. Patient is in no acute distress.  Skin:  Skin is warm and dry. No rash noted.   Cardiovascular: Normal heart rate noted  Respiratory: Normal respiratory effort, no problems with respiration noted  Abdomen: Soft, gravid, appropriate for gestational age. Pain/Pressure: Absent     Pelvic:  Cervical exam deferred        Extremities: Normal range of motion.     Mental Status: Normal mood and affect. Normal behavior. Normal judgment and thought content.   Assessment   30 y.o. G1P0 at [redacted]w[redacted]d by  01/25/2021, by Last Menstrual Period presenting for routine prenatal visit  Plan   June 2022 Problems (from 06/21/20 to present)     Problem Noted Resolved   Proteinuria affecting pregnancy 08/25/2020 by Conard Novak, MD No   Chronic hypertension affecting pregnancy 06/28/2020 by Conard Novak, MD No   Obesity affecting pregnancy 06/28/2020 by Conard Novak, MD No   Supervision of high risk pregnancy, antepartum 06/21/2020 by Natale Milch, MD No   Overview Addendum 09/06/2020  4:32 PM by Natale Milch, MD     Nursing Staff Provider  Office Location  [facility] Dating   LMP  Language  English Anatomy US  complete  Flu Vaccine   Genetic Screen  NIPS: normal xx/xx  TDaP vaccine    Hgb A1C or  GTT Early : hgba1c 5.4 Third trimester :   Covid  vaccinated   LAB RESULTS   Rhogam  Not needed Blood Type   A+  Feeding Plan Breast Antibody  negative  Contraception Undecided- considering OCP Rubella  non immune  Circumcision  RPR  nonreactive  Pediatrician   HBsAg  nonreactive  Support Person Husband- Robert HIV  nonreactive  Prenatal Classes discussed Varicella  immune    GBS  (For PCN allergy, check sensitivities)   BTL Consent n/a    VBAC Consent n/a Pap  2021 NIL    Hgb Electro      CF      SMA  Diamniotic, Dichorionic pregnancy Chronic hypertension- on labetalol and Norvasc          Twin gestation, dichorionic/diamniotic (two placentae, two amniotic sacs) 06/21/2020 by Homero Fellers, MD No    Overview Signed 06/21/2020 10:09 AM by Homero Fellers, MD    Dichorionic Diamniotic Twin Pregnancy Plan  Mom should take 60-120mg  of elemental iron a day as well as 1mg  of folic acid  [id number] wks:  Anatomy Ultrasound 20-Delivery: Growth Ultrasounds every 2-4 weeks** 36 wks: Initiate NSTs (initiate earlier if indicated)  Delivery No complications: 38 0/7 - 38 6/7 Isolated IUGR: 36 0/7- 37 6/7 IUGR with abnormal dopplers, hypertension, etc:  32 0/7 - 34 6/7            Preterm labor symptoms and general obstetric precautions including but not limited to vaginal bleeding, contractions, leaking of fluid and fetal movement were reviewed in detail with the patient. Please refer to After Visit Summary for other counseling recommendations.   - continue APT per MFM - increase labetalol to 200 mg po bid (taking 100 mg bid now) along with continuing amlodipine 5 mg daily.  Return in about 1 week (around 12/06/2020) for Weekly BP checks and ROB with MD.   Prentice Docker, MD, [place], [facility] 11/29/2020 10:52 AM

## 2020-11-30 ENCOUNTER — Observation Stay
Admission: EM | Admit: 2020-11-30 | Discharge: 2020-11-30 | Disposition: A | Discharge status: Home / Self Care | Payer: Managed Care, Other (non HMO) | Attending: Advanced Practice Midwife | Admitting: Advanced Practice Midwife

## 2020-11-30 ENCOUNTER — Other Ambulatory Visit: Payer: Self-pay | Admitting: *Deleted

## 2020-11-30 DIAGNOSIS — O26893 Other specified pregnancy related conditions, third trimester: Secondary | ICD-10-CM | POA: Diagnosis present

## 2020-11-30 DIAGNOSIS — O99213 Obesity complicating pregnancy, third trimester: Secondary | ICD-10-CM

## 2020-11-30 DIAGNOSIS — Z7982 Long term (current) use of aspirin: Secondary | ICD-10-CM | POA: Insufficient documentation

## 2020-11-30 DIAGNOSIS — O10013 Pre-existing essential hypertension complicating pregnancy, third trimester: Secondary | ICD-10-CM | POA: Insufficient documentation

## 2020-11-30 DIAGNOSIS — R109 Unspecified abdominal pain: Secondary | ICD-10-CM | POA: Diagnosis not present

## 2020-11-30 DIAGNOSIS — O10913 Unspecified pre-existing hypertension complicating pregnancy, third trimester: Secondary | ICD-10-CM

## 2020-11-30 DIAGNOSIS — Z3A33 33 weeks gestation of pregnancy: Secondary | ICD-10-CM | POA: Diagnosis not present

## 2020-11-30 DIAGNOSIS — O1212 Gestational proteinuria, second trimester: Secondary | ICD-10-CM

## 2020-11-30 DIAGNOSIS — Z3A32 32 weeks gestation of pregnancy: Secondary | ICD-10-CM

## 2020-11-30 DIAGNOSIS — O30043 Twin pregnancy, dichorionic/diamniotic, third trimester: Secondary | ICD-10-CM | POA: Diagnosis not present

## 2020-11-30 DIAGNOSIS — O0990 Supervision of high risk pregnancy, unspecified, unspecified trimester: Secondary | ICD-10-CM

## 2020-11-30 DIAGNOSIS — O10919 Unspecified pre-existing hypertension complicating pregnancy, unspecified trimester: Secondary | ICD-10-CM

## 2020-11-30 LAB — URINALYSIS, COMPLETE (UACMP) WITH MICROSCOPIC
Bacteria, UA: NONE SEEN
Bilirubin Urine: NEGATIVE
Glucose, UA: NEGATIVE mg/dL
Hgb urine dipstick: NEGATIVE
Ketones, ur: NEGATIVE mg/dL
Nitrite: NEGATIVE
Protein, ur: NEGATIVE mg/dL
Specific Gravity, Urine: 1.015 (ref 1.005–1.030)
pH: 7 (ref 5.0–8.0)

## 2020-11-30 NOTE — OB Triage Note (Signed)
Patient here for cramping. She notes that it has been off and on since 29 weeks but today and last night has been more constant. She denies bleeding or LOF but does note that she has seen and increase in vaginal discharge. Monitors applied continue to monitor.

## 2020-11-30 NOTE — Discharge Summary (Signed)
Physician Final Progress Note  Patient ID: Samantha Glass MRN: [id number] DOB/AGE: [birth year] 30 y.o.  Admit date: 11/30/2020 Admitting provider: Tresea Mall, CNM Discharge date: 11/30/2020   Admission Diagnoses:  1) intrauterine pregnancy at [redacted]w[redacted]d  2) abdominal cramping  Discharge Diagnoses:  Principal Problem:   Abdominal pain during pregnancy in third trimester    History of Present Illness: The patient is a 30 y.o. female G1P0 at [redacted]w[redacted]d who presents for pelvic cramping that she has noticed for the past few weeks. It has become stronger since last night. She admits adequate hydration normally although she did not drink as much as usual today. She feels the cramping throughout the day for several minutes at a time. She reports feeling both babies. She denies leakage of fluid or vaginal bleeding. She denies urinary or vaginitis symptoms.  She was admitted for observation, placed on monitors, lab collected. Monitoring and lab result are all reassuring. She is discharged to home with instructions and precautions.   Past Medical History:  Diagnosis Date   Depression    Depression, major, single episode, 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   Hepatitis    middle school age   Hypertension    IDA (iron deficiency anemia) 04/14/2019   Obesity (BMI 30.0-34.9) 04/14/2019   UTI (urinary tract infection)     Past Surgical History:  Procedure Laterality Date   GALLBLADDER SURGERY  2021   TONSILLECTOMY  age 70   UMBILICAL HERNIA REPAIR N/A 11/24/2019   Procedure: HERNIA REPAIR UMBILICAL ADULT, open;  Surgeon: Henrene Dodge, MD;  Location: [facility] ORS;  Service: General;  Laterality: N/A;    No current facility-administered medications on file prior to encounter.   Current Outpatient Medications on File Prior to Encounter  Medication Sig Dispense Refill   amLODipine (NORVASC) 5 MG tablet Take 1 tablet (5 mg total) by mouth daily. 90 tablet 1   aspirin EC 81 MG tablet Take 81 mg by  mouth daily. Swallow whole.     buPROPion (WELLBUTRIN XL) 300 MG 24 hr tablet TAKE 1 TABLET BY MOUTH EVERY DAY 90 tablet 3   Iron-Vitamin C (VITRON-C) 65-125 MG TABS Take 1 tablet by mouth at bedtime.     labetalol (NORMODYNE) 200 MG tablet Take 1 tablet (200 mg total) by mouth 2 (two) times daily. 180 tablet 1   Prenatal Vit-Fe Fumarate-FA (PRENATAL MULTIVITAMIN) TABS tablet Take 1 tablet by mouth daily at 12 noon.      No Known Allergies  Social History   Socioeconomic History   Marital status: Married    Spouse name: Not on file   Number of children: Not on file   Years of education: Not on file   Highest education level: Not on file  Occupational History   Not on file  Tobacco Use   Smoking status: Never   Smokeless tobacco: Never  Vaping Use   Vaping Use: Never used  Substance and Sexual Activity   Alcohol use: Not Currently    Comment: occassional   Drug use: No   Sexual activity: Yes  Other Topics Concern   Not on file  Social History Narrative   Not on file   Social Determinants of Health   Financial Resource Strain: Not on file  Food Insecurity: Not on file  Transportation Needs: Not on file  Physical Activity: Not on file  Stress: Not on file  Social Connections: Not on file  Intimate Partner Violence: Not on file    Family History  Problem  Relation Age of Onset   Hypertension Mother    Depression Mother    Hypertension Maternal Grandmother    Arthritis Maternal Grandmother    Diabetes Paternal Grandmother    Hypertension Paternal Grandmother    Hyperlipidemia Paternal Grandmother    Hypertension Paternal Grandfather      Review of Systems  Constitutional:  Negative for chills and fever.  HENT:  Negative for congestion, ear discharge, ear pain, hearing loss, sinus pain and sore throat.   Eyes:  Negative for blurred vision and double vision.  Respiratory:  Negative for cough, shortness of breath and wheezing.   Cardiovascular:  Negative for chest  pain, palpitations and leg swelling.  Gastrointestinal:  Positive for abdominal pain. Negative for blood in stool, constipation, diarrhea, heartburn, melena, nausea and vomiting.  Genitourinary:  Negative for dysuria, flank pain, frequency, hematuria and urgency.  Musculoskeletal:  Negative for back pain, joint pain and myalgias.  Skin:  Negative for itching and rash.  Neurological:  Negative for dizziness, tingling, tremors, sensory change, speech change, focal weakness, seizures, loss of consciousness, weakness and headaches.  Endo/Heme/Allergies:  Negative for environmental allergies. Does not bruise/bleed easily.  Psychiatric/Behavioral:  Negative for depression, hallucinations, memory loss, substance abuse and suicidal ideas. The patient is not nervous/anxious and does not have insomnia.     Physical Exam: BP 118/74   Pulse 98   LMP 04/20/2020   Constitutional: Well nourished, well developed female in no acute distress.  HEENT: normal Skin: Warm and dry.  Cardiovascular: Regular rate and rhythm.   Extremity:  no edema   Respiratory: Clear to auscultation bilateral. Normal respiratory effort Abdomen: FHT present Back: no CVAT Neuro: DTRs 2+, Cranial nerves grossly intact Psych: Alert and Oriented x3. No memory deficits. Normal mood and affect.   Toco: negative for contractions Fetal well being: Twin A: 150 bpm, moderate variability, +accelerations 15x15, -decelerations Twin B: 145 bpm, moderate variability, +accelerations 10x10, -decelerations  Consults: None  Significant Findings/ Diagnostic Studies: labs:   Latest Reference Range & Units 11/30/20 22:09  Appearance CLEAR  CLEAR  Bilirubin Urine NEGATIVE  NEGATIVE  Color, Urine YELLOW  YELLOW  Glucose, UA NEGATIVE mg/dL NEGATIVE  Hgb urine dipstick NEGATIVE  NEGATIVE  Ketones, ur NEGATIVE mg/dL NEGATIVE  Leukocytes,Ua NEGATIVE  TRACE !  Nitrite NEGATIVE  NEGATIVE  pH 5.0 - 8.0  7.0  Protein NEGATIVE mg/dL NEGATIVE   Specific Gravity, Urine 1.005 - 1.030  1.015  Bacteria, UA NONE SEEN  NONE SEEN  RBC / HPF 0 - 5 RBC/hpf 0-5  Squamous Epithelial / LPF 0 - 5  0-5  WBC, UA 0 - 5 WBC/hpf 0-5  !: Data is abnormal  Procedures: NST  Hospital Course: The patient was admitted to Labor and Delivery Triage for observation.   Discharge Condition: good  Disposition: Discharge disposition: 01-Home or Self Care  Diet: Regular diet  Discharge Activity: Activity as tolerated  Discharge Instructions     Discharge activity:  No Restrictions   Complete by: As directed    Discharge diet:  No restrictions   Complete by: As directed    No sexual activity restrictions   Complete by: As directed    Notify physician for a general feeling that "something is not right"   Complete by: As directed    Notify physician for increase or change in vaginal discharge   Complete by: As directed    Notify physician for intestinal cramps, with or without diarrhea, sometimes described as "gas pain"  Complete by: As directed    Notify physician for leaking of fluid   Complete by: As directed    Notify physician for low, dull backache, unrelieved by heat or Tylenol   Complete by: As directed    Notify physician for menstrual like cramps   Complete by: As directed    Notify physician for pelvic pressure   Complete by: As directed    Notify physician for uterine contractions.  These may be painless and feel like the uterus is tightening or the baby is  "balling up"   Complete by: As directed    Notify physician for vaginal bleeding   Complete by: As directed    PRETERM LABOR:  Includes any of the follwing symptoms that occur between 20 - [redacted] weeks gestation.  If these symptoms are not stopped, preterm labor can result in preterm delivery, placing your baby at risk   Complete by: As directed       Allergies as of 11/30/2020   No Known Allergies      Medication List     TAKE these medications    amLODipine 5 MG  tablet Commonly known as: NORVASC Take 1 tablet (5 mg total) by mouth daily.   aspirin EC 81 MG tablet Take 81 mg by mouth daily. Swallow whole.   buPROPion 300 MG 24 hr tablet Commonly known as: WELLBUTRIN XL TAKE 1 TABLET BY MOUTH EVERY DAY   labetalol 200 MG tablet Commonly known as: NORMODYNE Take 1 tablet (200 mg total) by mouth 2 (two) times daily.   prenatal multivitamin Tabs tablet Take 1 tablet by mouth daily at 12 noon.   Vitron-C 65-125 MG Tabs Generic drug: Iron-Vitamin C Take 1 tablet by mouth at bedtime.        [place]. Go to.   Specialty: Obstetrics and Gynecology Why: scheduled visit Contact information: [address] [place] [id number] [phone]                Total time spent taking care of this patient: 22 minutes  Signed: Rod Can, CNM  11/30/2020, 11:19 PM

## 2020-12-01 ENCOUNTER — Telehealth: Payer: Self-pay

## 2020-12-01 NOTE — Telephone Encounter (Signed)
Calling pt this AM to check on her from hospital visit from yesterday. Please let me know when she calls back

## 2020-12-07 ENCOUNTER — Ambulatory Visit: Payer: Managed Care, Other (non HMO) | Admitting: *Deleted

## 2020-12-07 ENCOUNTER — Encounter: Payer: Self-pay | Admitting: *Deleted

## 2020-12-07 ENCOUNTER — Other Ambulatory Visit: Payer: Self-pay

## 2020-12-07 ENCOUNTER — Ambulatory Visit: Payer: Managed Care, Other (non HMO) | Attending: Obstetrics and Gynecology

## 2020-12-07 ENCOUNTER — Ambulatory Visit (INDEPENDENT_AMBULATORY_CARE_PROVIDER_SITE_OTHER): Payer: Managed Care, Other (non HMO) | Admitting: Obstetrics and Gynecology

## 2020-12-07 VITALS — BP 128/86 | Wt 290.0 lb

## 2020-12-07 VITALS — BP 136/93 | HR 84

## 2020-12-07 DIAGNOSIS — O0990 Supervision of high risk pregnancy, unspecified, unspecified trimester: Secondary | ICD-10-CM

## 2020-12-07 DIAGNOSIS — Z3A33 33 weeks gestation of pregnancy: Secondary | ICD-10-CM

## 2020-12-07 DIAGNOSIS — O10919 Unspecified pre-existing hypertension complicating pregnancy, unspecified trimester: Secondary | ICD-10-CM

## 2020-12-07 DIAGNOSIS — O30043 Twin pregnancy, dichorionic/diamniotic, third trimester: Secondary | ICD-10-CM

## 2020-12-07 DIAGNOSIS — O99213 Obesity complicating pregnancy, third trimester: Secondary | ICD-10-CM

## 2020-12-07 LAB — POCT URINALYSIS DIPSTICK OB
Glucose, UA: NEGATIVE
POC,PROTEIN,UA: NEGATIVE

## 2020-12-07 NOTE — Progress Notes (Signed)
ROB - no concerns. RM 4 °

## 2020-12-07 NOTE — Progress Notes (Signed)
Routine Prenatal Care Visit  Subjective  Samantha Glass is a 30 y.o. G1P0 at [redacted]w[redacted]d being seen today for ongoing prenatal care.  She is currently monitored for the following issues for this high-risk pregnancy and has Essential hypertension; Depression, major, single episode, complete remission ([place]); Obesity (BMI 30.0-34.9); IDA (iron deficiency anemia); Annual physical exam; Petechiae; BMI 35.0-35.9,adult; Elevated C-reactive protein (CRP); Pigmented purpuric dermatosis; Hypokalemia; Symptomatic cholelithiasis; Umbilical hernia without obstruction and without gangrene; Mouth lesion; Supervision of high risk pregnancy, antepartum; Twin gestation, dichorionic/diamniotic (two placentae, two amniotic sacs); Chronic hypertension affecting pregnancy; Obesity affecting pregnancy; Proteinuria affecting pregnancy; Vaginal discharge during pregnancy; Hypertension in pregnancy; Abdominal pain during pregnancy in third trimester; and Labor and delivery indication for care or intervention on their problem list.  ----------------------------------------------------------------------------------- Patient reports no complaints.   Contractions: Not present. Vag. Bleeding: None.  Movement: Present. Denies leaking of fluid.  ----------------------------------------------------------------------------------- The following portions of the patient's history were reviewed and updated as appropriate: allergies, current medications, past family history, past medical history, past social history, past surgical history and problem list. Problem list updated.   Objective  Blood pressure 128/86, weight 290 lb (131.5 kg), last menstrual period 04/20/2020. Pregravid weight 245 lb (111.1 kg) Total Weight Gain 45 lb (20.4 kg) Urinalysis:      Fetal Status: Fetal Heart Rate (bpm): 140/150   Movement: Present     General:  Alert, oriented and cooperative. Patient is in no acute distress.  Skin: Skin is warm and dry. No  rash noted.   Cardiovascular: Normal heart rate noted  Respiratory: Normal respiratory effort, no problems with respiration noted  Abdomen: Soft, gravid, appropriate for gestational age. Pain/Pressure: Absent     Pelvic:  Cervical exam deferred        Extremities: Normal range of motion.     ental Status: Normal mood and affect. Normal behavior. Normal judgment and thought content.   [place] MFM FETAL BPP WO NON STRESS  Result Date: 12/08/2020 ----------------------------------------------------------------------  OBSTETRICS REPORT                       (Signed Final 12/08/2020 08:37 am) ---------------------------------------------------------------------- Patient Info  ID #:       [id number]                          D.O.B.:  04-07-1990 (30 yrs)  Name:       Samantha Glass               Visit Date: 12/08/2020 08:20 am ---------------------------------------------------------------------- Performed By  Attending:        Sander Nephew      Ref. Address:     [address], [place],  [place] [address]  Performed By:     Berlinda Last          Location[facility]                                     [facility]                                                             [place]                                                             [facility]  Referred By:      Homero Fellers ---------------------------------------------------------------------- Orders  #  Description                           Code        Ordered By  1  [place] MFM FETAL BPP WO NON               76819.01    RAVI SHANKAR     STRESS  2  [place] MFM FETAL BPP WO NST               76819.1     RAVI [facility]     ADDL GESTATION ----------------------------------------------------------------------  #  Order #                     Accession #                 Episode #  1  [id number]                   [id number]                 [id number]  2  [id number]                   [id number]                 [id number] ---------------------------------------------------------------------- Indications  Hypertension - Chronic/Pre-existing            O10.019  (labetalol)  [redacted] weeks gestation of pregnancy                Z3A.33  Twin pregnancy, di/di, third trimester         O30.043  A1c 5.1/5.4  LR NIPS ---------------------------------------------------------------------- Fetal Evaluation (Fetus A)  Num Of Fetuses:         2  Fetal Heart Rate(bpm):  152  Cardiac Activity:       Observed  Fetal Lie:              Maternal left side  Presentation:           Cephalic  Placenta:               Anterior  P. Cord Insertion:      Previously Visualized  Membrane Desc:      Dividing Membrane seen - Dichorionic.  Amniotic Fluid  AFI FV:      Within normal limits                              Largest Pocket(cm)                              3.1 ---------------------------------------------------------------------- Biophysical Evaluation (Fetus A)  Amniotic F.V:   Within normal limits       F. Tone:        Observed  F. Movement:    Observed                   Score:          8/8  F. Breathing:   Observed ---------------------------------------------------------------------- Gestational Age (Fetus A)  LMP:           32w 5d        Date:  04/23/20                 EDD:   01/28/21  Clinical EDD:  33w 1d                                        EDD:   01/25/21  Best:          33w 1d     Det. By:  Clinical EDD             EDD:   01/25/21 ---------------------------------------------------------------------- Anatomy (Fetus A)  Stomach:               Appears normal, left   Bladder:                Appears normal                         sided ---------------------------------------------------------------------- Fetal Evaluation (Fetus B)  Num Of Fetuses:         2  Fetal Heart Rate(bpm):  138  Cardiac Activity:        Observed  Fetal Lie:              Maternal right side  Presentation:           Cephalic  Placenta:               Anterior  P. Cord Insertion:      Previously Visualized  Membrane Desc:      Dividing Membrane seen - Dichorionic.  Amniotic Fluid  AFI FV:      Within normal limits                              Largest Pocket(cm)                              3.7 ---------------------------------------------------------------------- Biophysical Evaluation (Fetus B)  Amniotic F.V:   Within normal limits  F. Tone:        Observed  F. Movement:    Observed                   Score:          8/8  F. Breathing:   Observed ---------------------------------------------------------------------- Gestational Age (Fetus B)  LMP:           32w 5d        Date:  04/23/20                 EDD:   01/28/21  Clinical EDD:  33w 1d                                        EDD:   01/25/21  Best:          33w 1d     Det. By:  Clinical EDD             EDD:   01/25/21 ---------------------------------------------------------------------- Anatomy (Fetus B)  Stomach:               Appears normal, left   Bladder:                Appears normal                         sided ---------------------------------------------------------------------- Impression  Antenatal testing performed given maternal twin pregnancy  with known chronic hypertension.  Prior growth with 3% discordance, with growth appropriate for  gestational age on 11/28  Twin A normal stomach, amniotic fluid, bladder and BPP 8/8-  Maternal left, Cephalic  Twin B normal stomach, amniotic fluid, bladder and BPP 8/8-  Maternal right, Cephalic  Blood pressure is stable 136/96 with repeat of 142/94 mmHg  she denies s/sx of preeclampsia ---------------------------------------------------------------------- Recommendations  Continue weekly testing  Repeat growth in 2 weeks. ----------------------------------------------------------------------               Sander Nephew, MD  Electronically Signed Final Report   12/08/2020 08:37 am ----------------------------------------------------------------------  [place] MFM OB FOLLOW UP  Result Date: 11/28/2020 ----------------------------------------------------------------------  OBSTETRICS REPORT                       (Signed Final 11/28/2020 04:38 pm) ---------------------------------------------------------------------- Patient Info  ID #:       [id number]                          D.O.B.:  [birth year] (30 yrs)  Name:       Samantha Glass               Visit Date: 11/28/2020 02:29 pm ---------------------------------------------------------------------- Performed By  Attending:        Tama High MD        Ref. Address:     [address], [place],  [place] [address]  Performed By:     Marye Round Pharisien     Location:         [facility]                    [facility]                                     [facility] at                                                             [place] for                                                             Women  Referred By:      Homero Fellers ---------------------------------------------------------------------- Orders  #  Description                           Code        Ordered By  1  [place] MFM OB FOLLOW UP                   76816.01    RAVI SHANKAR  2  [place] MFM OB FOLLOW UP ADDL              [id number]    RAVI SHANKAR     GEST ----------------------------------------------------------------------  #  Order #                     Accession #                Episode #  1  [id number]                   [id number]                 [id number]  2  [id number]                   [id number]                 [id number] ---------------------------------------------------------------------- Indications  Hypertension - Chronic/Pre-existing            O10.019  (labetalol)  Twin  pregnancy, di/di, third trimester         O30.043  A1c 5.1/5.4  LR NIPS  [redacted] weeks gestation of pregnancy                Z3A.31 ---------------------------------------------------------------------- Fetal Evaluation (Fetus A)  Num Of Fetuses:         2  Fetal Heart Rate(bpm):  140  Cardiac Activity:       Observed  Fetal Lie:              Maternal left side  Presentation:  Cephalic  Placenta:               Anterior  P. Cord Insertion:      Previously Visualized  Membrane Desc:      Dividing Membrane seen - Dichorionic.  Amniotic Fluid  AFI FV:      Within normal limits                              Largest Pocket(cm)                              4.16 ---------------------------------------------------------------------- Biometry (Fetus A)  BPD:      77.3  mm     G. Age:  31w 0d         21  %    CI:         71.2   %    70 - 86                                                          FL/HC:      20.6   %    19.1 - 21.3  HC:      291.8  mm     G. Age:  32w 1d         25  %    HC/AC:      1.05        0.96 - 1.17  AC:      278.7  mm     G. Age:  31w 6d         54  %    FL/BPD:     77.9   %    71 - 87  FL:       60.2  mm     G. Age:  31w 2d         26  %    FL/AC:      21.6   %    20 - 24  HUM:      53.3  mm     G. Age:  31w 0d         37  %  Est. FW:    1817  gm           4 lb     37  %     FW Discordancy         3  % ---------------------------------------------------------------------- Gestational Age (Fetus A)  LMP:           31w 2d        Date:  04/23/20                 EDD:   01/28/21  Clinical EDD:  31w 5d                                        EDD:   01/25/21  U/S Today:     31w 4d  EDD:   01/26/21  Best:          31w 5d     Det. By:  Clinical EDD             EDD:   01/25/21 ---------------------------------------------------------------------- Anatomy (Fetus A)  Cranium:               Appears normal         Aortic Arch:            Previously seen  Cavum:                  Previously seen        Ductal Arch:            Not well visualized  Ventricles:            Previously seen        Diaphragm:              Previously seen  Choroid Plexus:        Previously seen        Stomach:                Appears normal, left                                                                        sided  Cerebellum:            Previously seen        Abdomen:                Previously seen  Posterior Fossa:       Previously seen        Abdominal Wall:         Previously seen  Nuchal Fold:           Not applicable (Q000111Q    Cord Vessels:           Previously seen                         wks GA)  Face:                  Orbits previously      Kidneys:                Appear normal                         seen  Lips:                  Previously seen        Bladder:                Appears normal  Thoracic:              Previously seen        Spine:                  Previously seen  Heart:                 Appears normal         Upper Extremities:  Previously seen                         (4CH, axis, and                         situs)  RVOT:                  Previously seen        Lower Extremities:      Previously seen  LVOT:                  Previously seen  Other:  Technically difficult due to maternal habitus and fetal position.Open          hands previously visualized. ---------------------------------------------------------------------- Fetal Evaluation (Fetus B)  Num Of Fetuses:         2  Fetal Heart Rate(bpm):  150  Cardiac Activity:       Observed  Fetal Lie:              Maternal right side  Presentation:           Cephalic  Placenta:               Anterior  P. Cord Insertion:      Previously Visualized  Membrane Desc:      Dividing Membrane seen - Dichorionic.  Amniotic Fluid  AFI FV:      Within normal limits                              Largest Pocket(cm)                              5.32 ---------------------------------------------------------------------- Biometry (Fetus B)  BPD:       83.4  mm     G. Age:  33w 4d         89  %    CI:        75.66   %    70 - 86                                                          FL/HC:      18.7   %    19.1 - 21.3  HC:       304   mm     G. Age:  33w 6d         70  %    HC/AC:      1.07        0.96 - 1.17  AC:      284.9  mm     G. Age:  32w 4d         72  %    FL/BPD:     68.1   %    71 - 87  FL:       56.8  mm     G. Age:  29w 6d        3.8  %    FL/AC:      19.9   %    20 - 24  HUM:  50.9  mm     G. Age:  29w 6d         10  %  LV:        5.5  mm  Est. FW:    1865  gm      4 lb 2 oz     45  %     FW Discordancy      0 \ 3 % ---------------------------------------------------------------------- Gestational Age (Fetus B)  LMP:           31w 2d        Date:  04/23/20                 EDD:   01/28/21  Clinical EDD:  31w 5d                                        EDD:   01/25/21  U/S Today:     32w 3d                                        EDD:   01/20/21  Best:          31w 5d     Det. By:  Clinical EDD             EDD:   01/25/21 ---------------------------------------------------------------------- Anatomy (Fetus B)  Cranium:               Appears normal         Aortic Arch:            Previously seen  Cavum:                 Previously seen        Ductal Arch:            Previously seen  Ventricles:            Appears normal         Diaphragm:              Previously seen  Choroid Plexus:        Previously seen        Stomach:                Appears normal, left                                                                        sided  Cerebellum:            Previously seen        Abdomen:                Previously seen  Posterior Fossa:       Previously seen        Abdominal Wall:         Previously seen  Nuchal Fold:           Previously seen        Cord Vessels:  Previously seen  Face:                  Orbits and profile     Kidneys:                Appear normal                         previously seen  Lips:                  Not well  visualized    Bladder:                Appears normal  Thoracic:              Previously seen        Spine:                  Previously seen  Heart:                 Appears normal         Upper Extremities:      Previously seen                         (4CH, axis, and                         situs)  RVOT:                  Not well visualized    Lower Extremities:      Previously seen  LVOT:                  Not well visualized  Other:  Technically difficult due to maternal habitus and fetal position. ---------------------------------------------------------------------- Cervix Uterus Adnexa  Cervix  Not visualized (advanced GA >24wks)  Uterus  Normal shape and size.  Right Ovary  Not visualized.  Left Ovary  Not visualized.  Cul De Sac  No free fluid seen. ---------------------------------------------------------------------- Impression  Dichorionic-diamniotic twin pregnancy.  Patient return for fetal  growth assessment.  Twin A: Maternal left, cephalic presentation, anterior  placenta.  Amniotic fluid is normal good fetal activity seen.  Fetal growth is appropriate for gestational age.  Twin B: Maternal right, cephalic presentation, anterior  placenta. Amniotic fluid is normal good fetal activity seen.  Fetal growth is appropriate for gestational age.  Growth discordancy: 3% (normal).  Blood pressure today at her office is 137/95 mmHg.  Patient  does not have symptoms and signs of severe features of  preeclampsia.  She takes labetalol for hypertension. ---------------------------------------------------------------------- Recommendations  -Continue weekly BPP till delivery. ----------------------------------------------------------------------                  Tama High, MD Electronically Signed Final Report   11/28/2020 04:38 pm ----------------------------------------------------------------------  [place] MFM OB FOLLOW UP ADDL GEST  Result Date:  11/28/2020 ----------------------------------------------------------------------  OBSTETRICS REPORT                       (Signed Final 11/28/2020 04:38 pm) ---------------------------------------------------------------------- Patient Info  ID #:       [id number]                          D.O.B.:  1990/01/03 (30 yrs)  Name:       Samantha Glass  Visit Date: 11/28/2020 02:29 pm ---------------------------------------------------------------------- Performed By  Attending:        Tama High MD        Ref. Address:     [address] [place],                                                             [place] [address]  Performed By:     Margaretann Loveless     Location[facility]         [facility]                    [facility]                                     [facility] at                                                             [place] for                                                             Women  Referred By:      Homero Fellers ---------------------------------------------------------------------- Orders  #  Description                           Code        Ordered By  1  [place] MFM OB FOLLOW UP                   [id number]    RAVI SHANKAR  2  [place] MFM OB FOLLOW UP ADDL              [id number]    [place] ----------------------------------------------------------------------  #  Order #                     Accession #                Episode #  1  [id number]                   [id number]                 [id number]  2  [id number]  [id number]                 [id number] ---------------------------------------------------------------------- Indications  Hypertension - Chronic/Pre-existing            O10.019  (labetalol)  Twin pregnancy, di/di, third trimester         O30.043  A1c 5.1/5.4  LR NIPS  [redacted] weeks gestation of pregnancy                Z3A.31  ---------------------------------------------------------------------- Fetal Evaluation (Fetus A)  Num Of Fetuses:         2  Fetal Heart Rate(bpm):  140  Cardiac Activity:       Observed  Fetal Lie:              Maternal left side  Presentation:           Cephalic  Placenta:               Anterior  P. Cord Insertion:      Previously Visualized  Membrane Desc:      Dividing Membrane seen - Dichorionic.  Amniotic Fluid  AFI FV:      Within normal limits                              Largest Pocket(cm)                              4.16 ---------------------------------------------------------------------- Biometry (Fetus A)  BPD:      77.3  mm     G. Age:  31w 0d         21  %    CI:         71.2   %    70 - 86                                                          FL/HC:      20.6   %    19.1 - 21.3  HC:      291.8  mm     G. Age:  32w 1d         25  %    HC/AC:      1.05        0.96 - 1.17  AC:      278.7  mm     G. Age:  31w 6d         54  %    FL/BPD:     77.9   %    71 - 87  FL:       60.2  mm     G. Age:  31w 2d         26  %    FL/AC:      21.6   %    20 - 24  HUM:      53.3  mm     G. Age:  31w 0d         37  %  Est. FW:    1817  gm           4 lb     37  %  FW Discordancy         3  % ---------------------------------------------------------------------- Gestational Age (Fetus A)  LMP:           31w 2d        Date:  04/23/20                 EDD:   01/28/21  Clinical EDD:  31w 5d                                        EDD:   01/25/21  U/S Today:     31w 4d                                        EDD:   01/26/21  Best:          31w 5d     Det. By:  Clinical EDD             EDD:   01/25/21 ---------------------------------------------------------------------- Anatomy (Fetus A)  Cranium:               Appears normal         Aortic Arch:            Previously seen  Cavum:                 Previously seen        Ductal Arch:            Not well visualized  Ventricles:            Previously seen        Diaphragm:               Previously seen  Choroid Plexus:        Previously seen        Stomach:                Appears normal, left                                                                        sided  Cerebellum:            Previously seen        Abdomen:                Previously seen  Posterior Fossa:       Previously seen        Abdominal Wall:         Previously seen  Nuchal Fold:           Not applicable (Q000111Q    Cord Vessels:           Previously seen                         wks GA)  Face:                  Orbits previously      Kidneys:  Appear normal                         seen  Lips:                  Previously seen        Bladder:                Appears normal  Thoracic:              Previously seen        Spine:                  Previously seen  Heart:                 Appears normal         Upper Extremities:      Previously seen                         (4CH, axis, and                         situs)  RVOT:                  Previously seen        Lower Extremities:      Previously seen  LVOT:                  Previously seen  Other:  Technically difficult due to maternal habitus and fetal position.Open          hands previously visualized. ---------------------------------------------------------------------- Fetal Evaluation (Fetus B)  Num Of Fetuses:         2  Fetal Heart Rate(bpm):  150  Cardiac Activity:       Observed  Fetal Lie:              Maternal right side  Presentation:           Cephalic  Placenta:               Anterior  P. Cord Insertion:      Previously Visualized  Membrane Desc:      Dividing Membrane seen - Dichorionic.  Amniotic Fluid  AFI FV:      Within normal limits                              Largest Pocket(cm)                              5.32 ---------------------------------------------------------------------- Biometry (Fetus B)  BPD:      83.4  mm     G. Age:  33w 4d         89  %    CI:        75.66   %    70 - 86                                                           FL/HC:      18.7   %    19.1 - 21.3  HC:       304   mm     G. Age:  33w 6d         70  %    HC/AC:      1.07        0.96 - 1.17  AC:      284.9  mm     G. Age:  32w 4d         72  %    FL/BPD:     68.1   %    71 - 87  FL:       56.8  mm     G. Age:  29w 6d        3.8  %    FL/AC:      19.9   %    20 - 24  HUM:      50.9  mm     G. Age:  29w 6d         10  %  LV:        5.5  mm  Est. FW:    1865  gm      4 lb 2 oz     45  %     FW Discordancy      0 \ 3 % ---------------------------------------------------------------------- Gestational Age (Fetus B)  LMP:           31w 2d        Date:  04/23/20                 EDD:   01/28/21  Clinical EDD:  31w 5d                                        EDD:   01/25/21  U/S Today:     32w 3d                                        EDD:   01/20/21  Best:          31w 5d     Det. By:  Clinical EDD             EDD:   01/25/21 ---------------------------------------------------------------------- Anatomy (Fetus B)  Cranium:               Appears normal         Aortic Arch:            Previously seen  Cavum:                 Previously seen        Ductal Arch:            Previously seen  Ventricles:            Appears normal         Diaphragm:              Previously seen  Choroid Plexus:        Previously seen        Stomach:                Appears normal, left  sided  Cerebellum:            Previously seen        Abdomen:                Previously seen  Posterior Fossa:       Previously seen        Abdominal Wall:         Previously seen  Nuchal Fold:           Previously seen        Cord Vessels:           Previously seen  Face:                  Orbits and profile     Kidneys:                Appear normal                         previously seen  Lips:                  Not well visualized    Bladder:                Appears normal  Thoracic:              Previously seen        Spine:                  Previously  seen  Heart:                 Appears normal         Upper Extremities:      Previously seen                         (4CH, axis, and                         situs)  RVOT:                  Not well visualized    Lower Extremities:      Previously seen  LVOT:                  Not well visualized  Other:  Technically difficult due to maternal habitus and fetal position. ---------------------------------------------------------------------- Cervix Uterus Adnexa  Cervix  Not visualized (advanced GA >24wks)  Uterus  Normal shape and size.  Right Ovary  Not visualized.  Left Ovary  Not visualized.  Cul De Sac  No free fluid seen. ---------------------------------------------------------------------- Impression  Dichorionic-diamniotic twin pregnancy.  Patient return for fetal  growth assessment.  Twin A: Maternal left, cephalic presentation, anterior  placenta.  Amniotic fluid is normal good fetal activity seen.  Fetal growth is appropriate for gestational age.  Twin B: Maternal right, cephalic presentation, anterior  placenta. Amniotic fluid is normal good fetal activity seen.  Fetal growth is appropriate for gestational age.  Growth discordancy: 3% (normal).  Blood pressure today at her office is 137/95 mmHg.  Patient  does not have symptoms and signs of severe features of  preeclampsia.  She takes labetalol for hypertension. ---------------------------------------------------------------------- Recommendations  -Continue weekly BPP till delivery. ----------------------------------------------------------------------                  Tama High, MD Electronically Signed Final Report  11/28/2020 04:38 pm ----------------------------------------------------------------------  [place] MFM FETAL BPP WO NST ADDL GESTATION  Result Date: 12/08/2020 ----------------------------------------------------------------------  OBSTETRICS REPORT                       (Signed Final 12/08/2020 08:37 am)  ---------------------------------------------------------------------- Patient Info  ID #:       [id number]                          D.O.B.:  [birth year] (30 yrs)  Name:       Samantha Glass               Visit Date: 12/08/2020 08:20 am ---------------------------------------------------------------------- Performed By  Attending:        Sander Nephew      Ref. Address:     [place] [place], [place],                                                             [place] [address]  Performed By:     Berlinda Last          Location[facility]                                                             [place] [facility]                                                             [facility]  Referred By:      Homero Fellers ---------------------------------------------------------------------- Orders  #  Description                           Code        Ordered By  1  [place] MFM FETAL BPP WO NON  [id number]    RAVI SHANKAR     STRESS  2  [place] MFM FETAL BPP WO NST               76819.1     [facility]     ADDL GESTATION ----------------------------------------------------------------------  #  Order #                     Accession #                Episode #  1  [id number]                   [id number]                 [id number]  2  [id number]                   [id number]                 [id number] ---------------------------------------------------------------------- Indications  Hypertension - Chronic/Pre-existing            O10.019  (labetalol)  [redacted] weeks gestation of pregnancy                Z3A.33  Twin pregnancy, di/di, third trimester         O30.043  A1c 5.1/5.4  LR NIPS ---------------------------------------------------------------------- Fetal Evaluation (Fetus A)  Num Of Fetuses:         2  Fetal Heart Rate(bpm):  152  Cardiac  Activity:       Observed  Fetal Lie:              Maternal left side  Presentation:           Cephalic  Placenta:               Anterior  P. Cord Insertion:      Previously Visualized  Membrane Desc:      Dividing Membrane seen - Dichorionic.  Amniotic Fluid  AFI FV:      Within normal limits                              Largest Pocket(cm)                              3.1 ---------------------------------------------------------------------- Biophysical Evaluation (Fetus A)  Amniotic F.V:   Within normal limits       F. Tone:        Observed  F. Movement:    Observed                   Score:          8/8  F. Breathing:   Observed ---------------------------------------------------------------------- Gestational Age (Fetus A)  LMP:           32w 5d        Date:  04/23/20                 EDD:   01/28/21  Clinical EDD:  33w 1d                                        EDD:   01/25/21  Best:          33w 1d  Det. By:  Clinical EDD             EDD:   01/25/21 ---------------------------------------------------------------------- Anatomy (Fetus A)  Stomach:               Appears normal, left   Bladder:                Appears normal                         sided ---------------------------------------------------------------------- Fetal Evaluation (Fetus B)  Num Of Fetuses:         2  Fetal Heart Rate(bpm):  138  Cardiac Activity:       Observed  Fetal Lie:              Maternal right side  Presentation:           Cephalic  Placenta:               Anterior  P. Cord Insertion:      Previously Visualized  Membrane Desc:      Dividing Membrane seen - Dichorionic.  Amniotic Fluid  AFI FV:      Within normal limits                              Largest Pocket(cm)                              3.7 ---------------------------------------------------------------------- Biophysical Evaluation (Fetus B)  Amniotic F.V:   Within normal limits       F. Tone:        Observed  F. Movement:    Observed                   Score:          8/8   F. Breathing:   Observed ---------------------------------------------------------------------- Gestational Age (Fetus B)  LMP:           32w 5d        Date:  04/23/20                 EDD:   01/28/21  Clinical EDD:  33w 1d                                        EDD:   01/25/21  Best:          33w 1d     Det. By:  Clinical EDD             EDD:   01/25/21 ---------------------------------------------------------------------- Anatomy (Fetus B)  Stomach:               Appears normal, left   Bladder:                Appears normal                         sided ---------------------------------------------------------------------- Impression  Antenatal testing performed given maternal twin pregnancy  with known chronic hypertension.  Prior growth with 3% discordance, with growth appropriate for  gestational age on 11/28  Twin A normal stomach, amniotic fluid, bladder and BPP 8/8-  Maternal left, Cephalic  Twin B normal stomach, amniotic fluid, bladder  and BPP 8/8-  Maternal right, Cephalic  Blood pressure is stable 136/96 with repeat of 142/94 mmHg  she denies s/sx of preeclampsia ---------------------------------------------------------------------- Recommendations  Continue weekly testing  Repeat growth in 2 weeks. ----------------------------------------------------------------------               Sander Nephew, MD Electronically Signed Final Report   12/08/2020 08:37 am ----------------------------------------------------------------------    Assessment   30 y.o. G1P0 at [redacted]w[redacted]d by  01/25/2021, by Last Menstrual Period presenting for routine prenatal visit  Plan   June 2022 Problems (from 06/21/20 to present)     Problem Noted Resolved   Proteinuria affecting pregnancy 08/25/2020 by Will Bonnet, MD No   Chronic hypertension affecting pregnancy 06/28/2020 by Will Bonnet, MD No   Obesity affecting pregnancy 06/28/2020 by Will Bonnet, MD No   Supervision of high risk pregnancy,  antepartum 06/21/2020 by Homero Fellers, MD No   Overview Addendum 09/06/2020  4:32 PM by Homero Fellers, MD     Nursing Staff Provider  Office Location  [facility] Dating   LMP  Language  English Anatomy US  complete  Flu Vaccine   Genetic Screen  NIPS: normal xx/xx  TDaP vaccine    Hgb A1C or  GTT Early : hgba1c 5.4 Third trimester : 90  Covid  vaccinated   LAB RESULTS   Rhogam  Not needed Blood Type   A+  Feeding Plan Breast Antibody  negative  Contraception Undecided- considering OCP Rubella  non immune  Circumcision  RPR  nonreactive  Pediatrician   HBsAg  nonreactive  Support Person Husband- Robert HIV  nonreactive  Prenatal Classes discussed Varicella  immune    GBS  (For PCN allergy, check sensitivities)   BTL Consent n/a    VBAC Consent n/a Pap  2021 NIL    Hgb Electro      CF      SMA        Diamniotic, Dichorionic pregnancy Chronic hypertension- on labetalol and Norvasc          Twin gestation, dichorionic/diamniotic (two placentae, two amniotic sacs) 06/21/2020 by Homero Fellers, MD No   Overview Signed 06/21/2020 10:09 AM by Homero Fellers, MD    Dichorionic Diamniotic Twin Pregnancy Plan  Mom should take 60-120mg  of elemental iron a day as well as 1mg  of folic acid  [id number] wks:  Anatomy Ultrasound 20-Delivery: Growth Ultrasounds every 2-4 weeks** 36 wks: Initiate NSTs (initiate earlier if indicated)  Delivery No complications: 38 0/7 - 38 6/7 Isolated IUGR: 36 0/7- 37 6/7 IUGR with abnormal dopplers, hypertension, etc:  32 0/7 - 34 6/7            Gestational age appropriate obstetric precautions including but not limited to vaginal bleeding, contractions, leaking of fluid and fetal movement were reviewed in detail with the patient.    Return in about 2 weeks (around 12/21/2020) for ROB.  Malachy Mood, MD, [facility], [facility] 12/07/2020, 10:17 AM

## 2020-12-08 ENCOUNTER — Ambulatory Visit: Payer: Managed Care, Other (non HMO) | Attending: Obstetrics and Gynecology

## 2020-12-08 ENCOUNTER — Encounter: Payer: Self-pay | Admitting: *Deleted

## 2020-12-08 ENCOUNTER — Ambulatory Visit: Payer: Managed Care, Other (non HMO) | Admitting: *Deleted

## 2020-12-08 VITALS — BP 142/94 | HR 92

## 2020-12-08 DIAGNOSIS — O99213 Obesity complicating pregnancy, third trimester: Secondary | ICD-10-CM

## 2020-12-08 DIAGNOSIS — O10919 Unspecified pre-existing hypertension complicating pregnancy, unspecified trimester: Secondary | ICD-10-CM | POA: Diagnosis present

## 2020-12-08 DIAGNOSIS — O0990 Supervision of high risk pregnancy, unspecified, unspecified trimester: Secondary | ICD-10-CM | POA: Diagnosis present

## 2020-12-08 DIAGNOSIS — O10913 Unspecified pre-existing hypertension complicating pregnancy, third trimester: Secondary | ICD-10-CM | POA: Insufficient documentation

## 2020-12-08 DIAGNOSIS — O10013 Pre-existing essential hypertension complicating pregnancy, third trimester: Secondary | ICD-10-CM | POA: Diagnosis not present

## 2020-12-08 DIAGNOSIS — O30043 Twin pregnancy, dichorionic/diamniotic, third trimester: Secondary | ICD-10-CM | POA: Insufficient documentation

## 2020-12-08 DIAGNOSIS — Z3A33 33 weeks gestation of pregnancy: Secondary | ICD-10-CM

## 2020-12-11 ENCOUNTER — Other Ambulatory Visit: Payer: Self-pay

## 2020-12-11 ENCOUNTER — Observation Stay
Admission: EM | Admit: 2020-12-11 | Discharge: 2020-12-12 | Disposition: A | Discharge status: Home / Self Care | Payer: Managed Care, Other (non HMO) | Attending: Obstetrics and Gynecology | Admitting: Obstetrics and Gynecology

## 2020-12-11 ENCOUNTER — Encounter: Payer: Self-pay | Admitting: Obstetrics and Gynecology

## 2020-12-11 DIAGNOSIS — Z7982 Long term (current) use of aspirin: Secondary | ICD-10-CM | POA: Diagnosis not present

## 2020-12-11 DIAGNOSIS — O10919 Unspecified pre-existing hypertension complicating pregnancy, unspecified trimester: Secondary | ICD-10-CM

## 2020-12-11 DIAGNOSIS — O99213 Obesity complicating pregnancy, third trimester: Secondary | ICD-10-CM

## 2020-12-11 DIAGNOSIS — Z3A33 33 weeks gestation of pregnancy: Secondary | ICD-10-CM | POA: Diagnosis not present

## 2020-12-11 DIAGNOSIS — O1212 Gestational proteinuria, second trimester: Secondary | ICD-10-CM

## 2020-12-11 DIAGNOSIS — O0990 Supervision of high risk pregnancy, unspecified, unspecified trimester: Secondary | ICD-10-CM

## 2020-12-11 DIAGNOSIS — O30043 Twin pregnancy, dichorionic/diamniotic, third trimester: Secondary | ICD-10-CM | POA: Diagnosis not present

## 2020-12-11 DIAGNOSIS — Z79899 Other long term (current) drug therapy: Secondary | ICD-10-CM | POA: Diagnosis not present

## 2020-12-11 DIAGNOSIS — O10013 Pre-existing essential hypertension complicating pregnancy, third trimester: Secondary | ICD-10-CM | POA: Diagnosis not present

## 2020-12-11 NOTE — OB Triage Note (Signed)
Patient is a 30 yo, G1P0, at 33 weeks 4 days. Patient presents with complaints of elevated blood pressures at home in the 140's/100's. Patient denies any vision changes, epigastric pain, or headache. Patient denies any vaginal bleeding or LOF. Patient reports positive fetal movement. Monitors applied and assessing. VSS. Intial BP 141/96. Initial fetal heart tone A 145, B 135. Will continue to monitor.

## 2020-12-11 NOTE — Discharge Summary (Signed)
Physician Final Progress Note  Patient ID: Samantha Glass MRN: [id number] DOB/AGE: 30/11/1990 30 y.o.  Admit date: 12/11/2020 Admitting provider: Vena Austria, MD Discharge date: 12/11/2020   Admission Diagnoses: Elevated home BP reading  Discharge Diagnoses:  Principal Problem:   Labor and delivery indication for care or intervention   30 y.o. G1P0 at [redacted]w[redacted]d by Estimated Date of Delivery: 01/25/21 with Di/Di twin pregnancy, obesity, CHTN on labetalol and amlodipine who noted home BP low 140/s systolic 100 diastolic and was told to come in by the nurse triage service.  Patient asymptomatic, normotensive to mild elevated BP in triage 140/90 asymptomatic.  +FM, no LOF, no VB, no ctx.  Reactive NST.  P/C ratio below prior baseline  June 2022 Problems (from 06/21/20 to present)     Problem Noted Resolved   Proteinuria affecting pregnancy 08/25/2020 by Conard Novak, MD No   Chronic hypertension affecting pregnancy 06/28/2020 by Conard Novak, MD No   Obesity affecting pregnancy 06/28/2020 by Conard Novak, MD No   Supervision of high risk pregnancy, antepartum 06/21/2020 by Natale Milch, MD No   Overview Addendum 12/07/2020 10:21 AM by Vena Austria, MD     Nursing Staff Provider  Office Location  [facility] Dating   LMP  Language  English Anatomy US  complete  Flu Vaccine   Genetic Screen  NIPS: normal xx/xx  TDaP vaccine    Hgb A1C or  GTT Early : hgba1c 5.4 Third trimester : 90  Covid  vaccinated   LAB RESULTS   Rhogam  Not needed Blood Type   A+  Feeding Plan Breast Antibody  negative  Contraception Undecided- considering OCP Rubella  non immune  Circumcision  RPR  nonreactive  Pediatrician   HBsAg  nonreactive  Support Person Husband- Robert HIV  nonreactive  Prenatal Classes discussed Varicella  immune    GBS  (For PCN allergy, check sensitivities)   BTL Consent n/a    VBAC Consent n/a Pap  2021 NIL    Hgb Electro      CF      SMA         Diamniotic, Dichorionic pregnancy Chronic hypertension- on labetalol and Norvasc          Twin gestation, dichorionic/diamniotic (two placentae, two amniotic sacs) 06/21/2020 by Natale Milch, MD No   Overview Signed 06/21/2020 10:09 AM by Natale Milch, MD    Dichorionic Diamniotic Twin Pregnancy Plan  Mom should take 60-120mg  of elemental iron a day as well as 1mg  of folic acid  18-20 wks:  Anatomy Ultrasound 20-Delivery: Growth Ultrasounds every 2-4 weeks** 36 wks: Initiate NSTs (initiate earlier if indicated)  Delivery No complications: 38 0/7 - 38 6/7 Isolated IUGR: 36 0/7- 37 6/7 IUGR with abnormal dopplers, hypertension, etc:  32 0/7 - 34 6/7             Consults: None  Significant Findings/ Diagnostic Studies:  Results for orders placed or performed during the hospital encounter of 12/11/20 (from the past 24 hour(s))  CBC     Status: Abnormal   Collection Time: 12/12/20 12:08 AM  Result Value Ref Range   WBC 9.1 4.0 - 10.5 K/uL   RBC 3.53 (L) 3.87 - 5.11 MIL/uL   Hemoglobin 11.2 (L) 12.0 - 15.0 g/dL   HCT 14/12/22 (L) 73.2 - 20.2 %   MCV 92.9 80.0 - 100.0 fL   MCH 31.7 26.0 - 34.0 pg   MCHC 34.1 30.0 -  36.0 g/dL   RDW 12.7 11.5 - 15.5 %   Platelets 152 150 - 400 K/uL   nRBC 0.0 0.0 - 0.2 %  Comprehensive metabolic panel     Status: Abnormal   Collection Time: 12/12/20 12:08 AM  Result Value Ref Range   Sodium 132 (L) 135 - 145 mmol/L   Potassium 3.8 3.5 - 5.1 mmol/L   Chloride 105 98 - 111 mmol/L   CO2 21 (L) 22 - 32 mmol/L   Glucose, Bld 98 70 - 99 mg/dL   BUN 10 6 - 20 mg/dL   Creatinine, Ser 0.59 0.44 - 1.00 mg/dL   Calcium 8.5 (L) 8.9 - 10.3 mg/dL   Total Protein 6.0 (L) 6.5 - 8.1 g/dL   Albumin 2.8 (L) 3.5 - 5.0 g/dL   AST 18 15 - 41 U/L   ALT 19 0 - 44 U/L   Alkaline Phosphatase 112 38 - 126 U/L   Total Bilirubin 0.4 0.3 - 1.2 mg/dL   GFR, Estimated >60 >60 mL/min   Anion gap 6 5 - 15  Protein / creatinine ratio, urine      Status: Abnormal   Collection Time: 12/12/20 12:19 AM  Result Value Ref Range   Creatinine, Urine 51 mg/dL   Total Protein, Urine 17 mg/dL   Protein Creatinine Ratio 0.33 (H) 0.00 - 0.15 mg/mg[Cre]     Procedures:  Fetus A Baseline: 140 Variability: moderate Accelerations: present Decelerations: absent  Fetus B Baseline: 145 Variability: moderate Accelerations: present Decelerations: absent  Tocometry: none The patient was monitored for 30 minutes, fetal heart rate tracing was deemed reactive, category I tracing,    Discharge Condition: good  Disposition: Discharge disposition: 01-Home or Self Care       Diet: Regular diet  Discharge Activity: Activity as tolerated  Discharge Instructions     Discharge activity:  No Restrictions   Complete by: As directed    Discharge diet:  No restrictions   Complete by: As directed    Fetal Kick Count:  Lie on our left side for one hour after a meal, and count the number of times your baby kicks.  If it is less than 5 times, get up, move around and drink some juice.  Repeat the test 30 minutes later.  If it is still less than 5 kicks in an hour, notify your doctor.   Complete by: As directed    LABOR:  When conractions begin, you should start to time them from the beginning of one contraction to the beginning  of the next.  When contractions are 5 - 10 minutes apart or less and have been regular for at least an hour, you should call your health care provider.   Complete by: As directed    No sexual activity restrictions   Complete by: As directed    Notify physician for bleeding from the vagina   Complete by: As directed    Notify physician for blurring of vision or spots before the eyes   Complete by: As directed    Notify physician for chills or fever   Complete by: As directed    Notify physician for fainting spells, "black outs" or loss of consciousness   Complete by: As directed    Notify physician for increase in  vaginal discharge   Complete by: As directed    Notify physician for leaking of fluid   Complete by: As directed    Notify physician for pain or burning when urinating  Complete by: As directed    Notify physician for pelvic pressure (sudden increase)   Complete by: As directed    Notify physician for severe or continued nausea or vomiting   Complete by: As directed    Notify physician for sudden gushing of fluid from the vagina (with or without continued leaking)   Complete by: As directed    Notify physician for sudden, constant, or occasional abdominal pain   Complete by: As directed    Notify physician if baby moving less than usual   Complete by: As directed       Allergies as of 12/11/2020   No Known Allergies      Medication List     TAKE these medications    amLODipine 5 MG tablet Commonly known as: NORVASC Take 1 tablet (5 mg total) by mouth daily.   aspirin EC 81 MG tablet Take 81 mg by mouth daily. Swallow whole.   buPROPion 300 MG 24 hr tablet Commonly known as: WELLBUTRIN XL TAKE 1 TABLET BY MOUTH EVERY DAY   labetalol 200 MG tablet Commonly known as: NORMODYNE Take 1 tablet (200 mg total) by mouth 2 (two) times daily.   prenatal multivitamin Tabs tablet Take 1 tablet by mouth daily at 12 noon.   Vitron-C 65-125 MG Tabs Generic drug: Iron-Vitamin C Take 1 tablet by mouth at bedtime.         Total time spent taking care of this patient: 40 minutes  Signed: Malachy Mood 12/11/2020, 11:34 PM

## 2020-12-12 ENCOUNTER — Encounter: Payer: Self-pay | Admitting: Obstetrics and Gynecology

## 2020-12-12 LAB — CBC
HCT: 32.8 % — ABNORMAL LOW (ref 36.0–46.0)
Hemoglobin: 11.2 g/dL — ABNORMAL LOW (ref 12.0–15.0)
MCH: 31.7 pg (ref 26.0–34.0)
MCHC: 34.1 g/dL (ref 30.0–36.0)
MCV: 92.9 fL (ref 80.0–100.0)
Platelets: 152 10*3/uL (ref 150–400)
RBC: 3.53 MIL/uL — ABNORMAL LOW (ref 3.87–5.11)
RDW: 12.7 % (ref 11.5–15.5)
WBC: 9.1 10*3/uL (ref 4.0–10.5)
nRBC: 0 % (ref 0.0–0.2)

## 2020-12-12 LAB — COMPREHENSIVE METABOLIC PANEL
ALT: 19 U/L (ref 0–44)
AST: 18 U/L (ref 15–41)
Albumin: 2.8 g/dL — ABNORMAL LOW (ref 3.5–5.0)
Alkaline Phosphatase: 112 U/L (ref 38–126)
Anion gap: 6 (ref 5–15)
BUN: 10 mg/dL (ref 6–20)
CO2: 21 mmol/L — ABNORMAL LOW (ref 22–32)
Calcium: 8.5 mg/dL — ABNORMAL LOW (ref 8.9–10.3)
Chloride: 105 mmol/L (ref 98–111)
Creatinine, Ser: 0.59 mg/dL (ref 0.44–1.00)
GFR, Estimated: >60 mL/min (ref >60)
Glucose, Bld: 98 mg/dL (ref 70–99)
Potassium: 3.8 mmol/L (ref 3.5–5.1)
Sodium: 132 mmol/L — ABNORMAL LOW (ref 135–145)
Total Bilirubin: 0.4 mg/dL (ref 0.3–1.2)
Total Protein: 6 g/dL — ABNORMAL LOW (ref 6.5–8.1)

## 2020-12-12 LAB — PROTEIN / CREATININE RATIO, URINE
Creatinine, Urine: 51 mg/dL
Protein Creatinine Ratio: 0.33 mg/mg{Cre} — ABNORMAL HIGH (ref 0.00–0.15)
Total Protein, Urine: 17 mg/dL

## 2020-12-12 NOTE — Progress Notes (Signed)
Discharge instructions provided to patient. Patient verbalized understanding. Pt educated on signs and symptoms of labor, vaginal bleeding, LOF, and fetal movement. Red flag signs reviewed by RN. Patient discharged home with significant other in stable condition.  

## 2020-12-14 ENCOUNTER — Ambulatory Visit: Payer: Managed Care, Other (non HMO) | Attending: Obstetrics

## 2020-12-14 ENCOUNTER — Other Ambulatory Visit: Payer: Self-pay

## 2020-12-14 ENCOUNTER — Ambulatory Visit: Payer: Managed Care, Other (non HMO) | Admitting: *Deleted

## 2020-12-14 ENCOUNTER — Encounter: Payer: Self-pay | Admitting: *Deleted

## 2020-12-14 VITALS — BP 134/93 | HR 83

## 2020-12-14 DIAGNOSIS — O30043 Twin pregnancy, dichorionic/diamniotic, third trimester: Secondary | ICD-10-CM | POA: Insufficient documentation

## 2020-12-14 DIAGNOSIS — O10919 Unspecified pre-existing hypertension complicating pregnancy, unspecified trimester: Secondary | ICD-10-CM

## 2020-12-14 DIAGNOSIS — O0990 Supervision of high risk pregnancy, unspecified, unspecified trimester: Secondary | ICD-10-CM | POA: Diagnosis present

## 2020-12-14 DIAGNOSIS — Z3A34 34 weeks gestation of pregnancy: Secondary | ICD-10-CM

## 2020-12-14 DIAGNOSIS — O99213 Obesity complicating pregnancy, third trimester: Secondary | ICD-10-CM | POA: Insufficient documentation

## 2020-12-14 DIAGNOSIS — O10013 Pre-existing essential hypertension complicating pregnancy, third trimester: Secondary | ICD-10-CM

## 2020-12-14 DIAGNOSIS — O1213 Gestational proteinuria, third trimester: Secondary | ICD-10-CM | POA: Diagnosis present

## 2020-12-14 DIAGNOSIS — O10913 Unspecified pre-existing hypertension complicating pregnancy, third trimester: Secondary | ICD-10-CM | POA: Diagnosis present

## 2020-12-15 ENCOUNTER — Encounter: Payer: Self-pay | Admitting: Obstetrics and Gynecology

## 2020-12-15 ENCOUNTER — Ambulatory Visit (INDEPENDENT_AMBULATORY_CARE_PROVIDER_SITE_OTHER): Payer: Managed Care, Other (non HMO) | Admitting: Obstetrics and Gynecology

## 2020-12-15 VITALS — BP 140/90 | Ht 71.0 in | Wt 292.4 lb

## 2020-12-15 DIAGNOSIS — Z3A34 34 weeks gestation of pregnancy: Secondary | ICD-10-CM

## 2020-12-15 DIAGNOSIS — O0990 Supervision of high risk pregnancy, unspecified, unspecified trimester: Secondary | ICD-10-CM

## 2020-12-15 LAB — POCT URINALYSIS DIPSTICK OB: Glucose, UA: NEGATIVE

## 2020-12-15 NOTE — Progress Notes (Signed)
Routine Prenatal Care Visit  Subjective  Samantha Glass is a 30 y.o. G1P0 at [redacted]w[redacted]d being seen today for ongoing prenatal care.  She is currently monitored for the following issues for this high-risk pregnancy and has Essential hypertension; Depression, major, single episode, complete remission ([place]); Obesity (BMI 30.0-34.9); IDA (iron deficiency anemia); Annual physical exam; Petechiae; BMI 35.0-35.9,adult; Elevated C-reactive protein (CRP); Pigmented purpuric dermatosis; Hypokalemia; Symptomatic cholelithiasis; Umbilical hernia without obstruction and without gangrene; Mouth lesion; Supervision of high risk pregnancy, antepartum; Twin gestation, dichorionic/diamniotic (two placentae, two amniotic sacs); Chronic hypertension affecting pregnancy; Obesity affecting pregnancy; Proteinuria affecting pregnancy; Vaginal discharge during pregnancy; Hypertension in pregnancy; Abdominal pain during pregnancy in third trimester; and Labor and delivery indication for care or intervention on their problem list.  ----------------------------------------------------------------------------------- Patient reports no complaints.   Contractions: Not present. Vag. Bleeding: None.  Movement: Present. Denies leaking of fluid.  ----------------------------------------------------------------------------------- The following portions of the patient's history were reviewed and updated as appropriate: allergies, current medications, past family history, past medical history, past social history, past surgical history and problem list. Problem list updated.   Objective  Blood pressure 140/90, height 5\' 11"  (1.803 m), weight 292 lb 6.4 oz (132.6 kg), last menstrual period 04/20/2020. Pregravid weight 245 lb (111.1 kg) Total Weight Gain 47 lb 6.4 oz (21.5 kg) Urinalysis:      Fetal Status:     Movement: Present     General:  Alert, oriented and cooperative. Patient is in no acute distress.  Skin: Skin is warm and  dry. No rash noted.   Cardiovascular: Normal heart rate noted  Respiratory: Normal respiratory effort, no problems with respiration noted  Abdomen: Soft, gravid, appropriate for gestational age. Pain/Pressure: Present     Pelvic:  Cervical exam deferred        Extremities: Normal range of motion.     Mental Status: Normal mood and affect. Normal behavior. Normal judgment and thought content.     Assessment   30 y.o. G1P0 at [redacted]w[redacted]d by  01/25/2021, by Last Menstrual Period presenting for routine prenatal visit  Plan   June 2022 Problems (from 06/21/20 to present)     Problem Noted Resolved   Proteinuria affecting pregnancy 08/25/2020 by Will Bonnet, MD No   Chronic hypertension affecting pregnancy 06/28/2020 by Will Bonnet, MD No   Obesity affecting pregnancy 06/28/2020 by Will Bonnet, MD No   Supervision of high risk pregnancy, antepartum 06/21/2020 by Homero Fellers, MD No   Overview Addendum 12/07/2020 10:21 AM by Malachy Mood, MD     Nursing Staff Provider  Office Location  [facility] Dating   LMP  Language  English Anatomy US  complete  Flu Vaccine   Genetic Screen  NIPS: normal xx/xx  TDaP vaccine    Hgb A1C or  GTT Early : hgba1c 5.4 Third trimester : 90  Covid  vaccinated   LAB RESULTS   Rhogam  Not needed Blood Type   A+  Feeding Plan Breast Antibody  negative  Contraception Undecided- considering OCP Rubella  non immune  Circumcision  RPR  nonreactive  Pediatrician   HBsAg  nonreactive  Support Person Husband- Robert HIV  nonreactive  Prenatal Classes discussed Varicella  immune    GBS  (For PCN allergy, check sensitivities)   BTL Consent n/a    VBAC Consent n/a Pap  2021 NIL    Hgb Electro      CF      SMA  Diamniotic, Dichorionic pregnancy Chronic hypertension- on labetalol and Norvasc          Twin gestation, dichorionic/diamniotic (two placentae, two amniotic sacs) 06/21/2020 by Natale Milch, MD No   Overview  Signed 06/21/2020 10:09 AM by Natale Milch, MD    Dichorionic Diamniotic Twin Pregnancy Plan  Mom should take 60-120mg  of elemental iron a day as well as 1mg  of folic acid  18-20 wks:  Anatomy Ultrasound 20-Delivery: Growth Ultrasounds every 2-4 weeks** 36 wks: Initiate NSTs (initiate earlier if indicated)  Delivery No complications: 38 0/7 - 38 6/7 Isolated IUGR: 36 0/7- 37 6/7 IUGR with abnormal dopplers, hypertension, etc:  32 0/7 - 34 6/7            Recent PC ratio elevated suggesting moderate preeclampsia  Gestational age appropriate obstetric precautions including but not limited to vaginal bleeding, contractions, leaking of fluid and fetal movement were reviewed in detail with the patient.    Return in about 1 week (around 12/22/2020) for HROB weekly with MD for 4 weeks.  12/24/2020 MD [facility], [facility] 12/15/2020, 3:25 PM

## 2020-12-15 NOTE — Telephone Encounter (Signed)
Call from husband with concerns over BP and swelling. Pt is a G1P0 at 34 weeks with di/di twins. BP 140/105 earlier in the day and presently 139/94. Pt denies HA, blurry vision or epigastric pain. This RN spoke with Dr Tiburcio Pea and report given. He recommends pt to call in the am to be seen at the office but if any HA to come in and be seen overnight. Call back to pt and reviewed conversation with Dr Tiburcio Pea. Pt and husband verbalize understanding and pt will rest tonight and call office in the am. Husband also concerned with swelling and pitting edema to the bilateral legs. He was concerned about TED hose being to tight. Recommend not to wear tonight and discuss at office visit.

## 2020-12-16 ENCOUNTER — Ambulatory Visit: Payer: Managed Care, Other (non HMO)

## 2020-12-16 ENCOUNTER — Other Ambulatory Visit: Payer: Self-pay

## 2020-12-16 ENCOUNTER — Encounter: Payer: Self-pay | Admitting: Obstetrics and Gynecology

## 2020-12-16 VITALS — BP 140/90 | HR 85 | Ht 71.0 in

## 2020-12-16 DIAGNOSIS — O10013 Pre-existing essential hypertension complicating pregnancy, third trimester: Secondary | ICD-10-CM

## 2020-12-16 NOTE — Progress Notes (Signed)
Patient reports today for Blood Pressure check. Blood pressure remains stable. Patient noticed some increased swelling of feet last night. She denies lying with feet elevated above heart. Advised to do this and monitor swelling as well as blood pressure. Call with any questions or concerns and report headaches unrelieved with tylenol, blurred vision, increased swelling.

## 2020-12-16 NOTE — Telephone Encounter (Signed)
AMS sent message to patient.

## 2020-12-17 ENCOUNTER — Inpatient Hospital Stay
Admission: EM | Admit: 2020-12-17 | Discharge: 2020-12-26 | DRG: 806 | Disposition: A | Discharge status: Home / Self Care | Payer: Managed Care, Other (non HMO) | Attending: Obstetrics and Gynecology | Admitting: Obstetrics and Gynecology

## 2020-12-17 ENCOUNTER — Encounter: Payer: Self-pay | Admitting: Obstetrics and Gynecology

## 2020-12-17 ENCOUNTER — Other Ambulatory Visit: Payer: Self-pay

## 2020-12-17 DIAGNOSIS — Z7982 Long term (current) use of aspirin: Secondary | ICD-10-CM

## 2020-12-17 DIAGNOSIS — Z3A35 35 weeks gestation of pregnancy: Secondary | ICD-10-CM | POA: Diagnosis not present

## 2020-12-17 DIAGNOSIS — O30043 Twin pregnancy, dichorionic/diamniotic, third trimester: Secondary | ICD-10-CM | POA: Diagnosis present

## 2020-12-17 DIAGNOSIS — O99213 Obesity complicating pregnancy, third trimester: Secondary | ICD-10-CM | POA: Diagnosis not present

## 2020-12-17 DIAGNOSIS — O9903 Anemia complicating the puerperium: Secondary | ICD-10-CM | POA: Diagnosis not present

## 2020-12-17 DIAGNOSIS — R519 Headache, unspecified: Secondary | ICD-10-CM

## 2020-12-17 DIAGNOSIS — O113 Pre-existing hypertension with pre-eclampsia, third trimester: Secondary | ICD-10-CM | POA: Diagnosis not present

## 2020-12-17 DIAGNOSIS — I10 Essential (primary) hypertension: Secondary | ICD-10-CM

## 2020-12-17 DIAGNOSIS — F32A Depression, unspecified: Secondary | ICD-10-CM | POA: Diagnosis present

## 2020-12-17 DIAGNOSIS — O1490 Unspecified pre-eclampsia, unspecified trimester: Secondary | ICD-10-CM

## 2020-12-17 DIAGNOSIS — O0990 Supervision of high risk pregnancy, unspecified, unspecified trimester: Secondary | ICD-10-CM

## 2020-12-17 DIAGNOSIS — O1002 Pre-existing essential hypertension complicating childbirth: Secondary | ICD-10-CM | POA: Diagnosis present

## 2020-12-17 DIAGNOSIS — O1414 Severe pre-eclampsia complicating childbirth: Secondary | ICD-10-CM | POA: Diagnosis not present

## 2020-12-17 DIAGNOSIS — D62 Acute posthemorrhagic anemia: Secondary | ICD-10-CM | POA: Diagnosis not present

## 2020-12-17 DIAGNOSIS — O1493 Unspecified pre-eclampsia, third trimester: Secondary | ICD-10-CM | POA: Diagnosis not present

## 2020-12-17 DIAGNOSIS — Z20822 Contact with and (suspected) exposure to covid-19: Secondary | ICD-10-CM | POA: Diagnosis present

## 2020-12-17 DIAGNOSIS — O9081 Anemia of the puerperium: Secondary | ICD-10-CM | POA: Diagnosis not present

## 2020-12-17 DIAGNOSIS — O1413 Severe pre-eclampsia, third trimester: Secondary | ICD-10-CM | POA: Diagnosis not present

## 2020-12-17 DIAGNOSIS — O10013 Pre-existing essential hypertension complicating pregnancy, third trimester: Secondary | ICD-10-CM | POA: Diagnosis not present

## 2020-12-17 DIAGNOSIS — Z3A37 37 weeks gestation of pregnancy: Secondary | ICD-10-CM | POA: Diagnosis not present

## 2020-12-17 DIAGNOSIS — O114 Pre-existing hypertension with pre-eclampsia, complicating childbirth: Principal | ICD-10-CM | POA: Diagnosis present

## 2020-12-17 DIAGNOSIS — O99214 Obesity complicating childbirth: Secondary | ICD-10-CM | POA: Diagnosis present

## 2020-12-17 DIAGNOSIS — Z3A34 34 weeks gestation of pregnancy: Secondary | ICD-10-CM | POA: Diagnosis not present

## 2020-12-17 DIAGNOSIS — O99344 Other mental disorders complicating childbirth: Secondary | ICD-10-CM | POA: Diagnosis present

## 2020-12-17 DIAGNOSIS — O30049 Twin pregnancy, dichorionic/diamniotic, unspecified trimester: Secondary | ICD-10-CM | POA: Diagnosis present

## 2020-12-17 DIAGNOSIS — Z3490 Encounter for supervision of normal pregnancy, unspecified, unspecified trimester: Secondary | ICD-10-CM

## 2020-12-17 DIAGNOSIS — E669 Obesity, unspecified: Secondary | ICD-10-CM | POA: Diagnosis not present

## 2020-12-17 DIAGNOSIS — O1403 Mild to moderate pre-eclampsia, third trimester: Secondary | ICD-10-CM | POA: Diagnosis not present

## 2020-12-17 DIAGNOSIS — O10919 Unspecified pre-existing hypertension complicating pregnancy, unspecified trimester: Secondary | ICD-10-CM | POA: Diagnosis present

## 2020-12-17 LAB — COMPREHENSIVE METABOLIC PANEL
ALT: 18 U/L (ref 0–44)
AST: 21 U/L (ref 15–41)
Albumin: 3 g/dL — ABNORMAL LOW (ref 3.5–5.0)
Alkaline Phosphatase: 107 U/L (ref 38–126)
Anion gap: 5 (ref 5–15)
BUN: 12 mg/dL (ref 6–20)
CO2: 22 mmol/L (ref 22–32)
Calcium: 8.8 mg/dL — ABNORMAL LOW (ref 8.9–10.3)
Chloride: 102 mmol/L (ref 98–111)
Creatinine, Ser: 0.58 mg/dL (ref 0.44–1.00)
GFR, Estimated: >60 mL/min (ref >60)
Glucose, Bld: 89 mg/dL (ref 70–99)
Potassium: 4.1 mmol/L (ref 3.5–5.1)
Sodium: 129 mmol/L — ABNORMAL LOW (ref 135–145)
Total Bilirubin: 0.6 mg/dL (ref 0.3–1.2)
Total Protein: 6.5 g/dL (ref 6.5–8.1)

## 2020-12-17 LAB — RESP PANEL BY RT-PCR (FLU A&B, COVID) ARPGX2
Influenza A by PCR: NEGATIVE
Influenza B by PCR: NEGATIVE
SARS Coronavirus 2 by RT PCR: NEGATIVE

## 2020-12-17 LAB — RAPID HIV SCREEN (HIV 1/2 AB+AG)
HIV 1/2 Antibodies: NONREACTIVE
HIV-1 P24 Antigen - HIV24: NONREACTIVE

## 2020-12-17 LAB — CBC WITH DIFFERENTIAL/PLATELET
Abs Immature Granulocytes: 0.07 10*3/uL (ref 0.00–0.07)
Basophils Absolute: 0 10*3/uL (ref 0.0–0.1)
Basophils Relative: 0 %
Eosinophils Absolute: 0.2 10*3/uL (ref 0.0–0.5)
Eosinophils Relative: 2 %
HCT: 34.1 % — ABNORMAL LOW (ref 36.0–46.0)
Hemoglobin: 11.8 g/dL — ABNORMAL LOW (ref 12.0–15.0)
Immature Granulocytes: 1 %
Lymphocytes Relative: 23 %
Lymphs Abs: 2.5 10*3/uL (ref 0.7–4.0)
MCH: 31.6 pg (ref 26.0–34.0)
MCHC: 34.6 g/dL (ref 30.0–36.0)
MCV: 91.4 fL (ref 80.0–100.0)
Monocytes Absolute: 0.8 10*3/uL (ref 0.1–1.0)
Monocytes Relative: 7 %
Neutro Abs: 7.5 10*3/uL (ref 1.7–7.7)
Neutrophils Relative %: 67 %
Platelets: 156 10*3/uL (ref 150–400)
RBC: 3.73 MIL/uL — ABNORMAL LOW (ref 3.87–5.11)
RDW: 12.4 % (ref 11.5–15.5)
WBC: 11 10*3/uL — ABNORMAL HIGH (ref 4.0–10.5)
nRBC: 0 % (ref 0.0–0.2)

## 2020-12-17 LAB — TYPE AND SCREEN
ABO/RH(D): A POS
Antibody Screen: NEGATIVE

## 2020-12-17 LAB — PROTEIN / CREATININE RATIO, URINE
Creatinine, Urine: 16 mg/dL
Protein Creatinine Ratio: 4.69 mg/mg{Cre} — ABNORMAL HIGH (ref 0.00–0.15)
Total Protein, Urine: 75 mg/dL

## 2020-12-17 LAB — RPR: RPR Ser Ql: NONREACTIVE

## 2020-12-17 LAB — ELECTROLYTE PANEL
Anion gap: 5 (ref 5–15)
CO2: 19 mmol/L — ABNORMAL LOW (ref 22–32)
Chloride: 104 mmol/L (ref 98–111)
Potassium: 5.1 mmol/L (ref 3.5–5.1)
Sodium: 128 mmol/L — ABNORMAL LOW (ref 135–145)

## 2020-12-17 LAB — ABO/RH: ABO/RH(D): A POS

## 2020-12-17 LAB — GROUP B STREP BY PCR: Group B strep by PCR: NEGATIVE

## 2020-12-17 MED ORDER — BETAMETHASONE SOD PHOS & ACET 6 (3-3) MG/ML IJ SUSP: 12.0000 mg | Freq: Once | INTRAMUSCULAR | Status: DC

## 2020-12-17 MED ORDER — DIPHENHYDRAMINE HCL 50 MG/ML IJ SOLN
25.0000 mg | Freq: Once | INTRAMUSCULAR | Status: AC
  Administered 2020-12-17: 25 mg via INTRAVENOUS
  Filled 2020-12-17: qty 1

## 2020-12-17 MED ORDER — ONDANSETRON HCL 4 MG/2ML IJ SOLN: 4.0000 mg | Freq: Four times a day (QID) | INTRAMUSCULAR | Status: DC | PRN

## 2020-12-17 MED ORDER — LACTATED RINGERS IV SOLN: 500.0000 mL | INTRAVENOUS | Status: DC | PRN

## 2020-12-17 MED ORDER — OXYTOCIN-SODIUM CHLORIDE 30-0.9 UT/500ML-% IV SOLN: 2.5000 [IU]/h | INTRAVENOUS | Status: DC

## 2020-12-17 MED ORDER — LACTATED RINGERS IV BOLUS
1000.0000 mL | Freq: Once | INTRAVENOUS | Status: AC
  Administered 2020-12-17: 1000 mL via INTRAVENOUS

## 2020-12-17 MED ORDER — LACTATED RINGERS IV SOLN: INTRAVENOUS | Status: DC

## 2020-12-17 MED ORDER — PROCHLORPERAZINE EDISYLATE 10 MG/2ML IJ SOLN
10.0000 mg | Freq: Once | INTRAMUSCULAR | Status: AC | PRN
  Administered 2020-12-17: 10 mg via INTRAVENOUS
  Filled 2020-12-17: qty 2

## 2020-12-17 MED ORDER — OXYTOCIN BOLUS FROM INFUSION: 333.0000 mL | Freq: Once | INTRAVENOUS | Status: DC

## 2020-12-17 MED ORDER — AMLODIPINE BESYLATE 5 MG PO TABS
5.0000 mg | ORAL_TABLET | Freq: Every day | ORAL | Status: DC
  Administered 2020-12-17 – 2020-12-24 (×7): 5 mg via ORAL
  Filled 2020-12-17 (×9): qty 1

## 2020-12-17 MED ORDER — ASCORBIC ACID 500 MG PO TABS
250.0000 mg | ORAL_TABLET | Freq: Every day | ORAL | Status: DC
Start: 2020-12-17 — End: 2020-12-22
  Administered 2020-12-17 – 2020-12-21 (×5): 250 mg via ORAL
  Filled 2020-12-17: qty 0.5
  Filled 2020-12-17 (×5): qty 1

## 2020-12-17 MED ORDER — BUPROPION HCL ER (XL) 300 MG PO TB24
300.0000 mg | ORAL_TABLET | Freq: Every day | ORAL | Status: DC
  Administered 2020-12-17 – 2020-12-26 (×9): 300 mg via ORAL
  Filled 2020-12-17 (×10): qty 1

## 2020-12-17 MED ORDER — BUTORPHANOL TARTRATE 1 MG/ML IJ SOLN: 1.0000 mg | INTRAMUSCULAR | Status: DC | PRN

## 2020-12-17 MED ORDER — SOD CITRATE-CITRIC ACID 500-334 MG/5ML PO SOLN
30.0000 mL | ORAL | Status: DC | PRN
  Administered 2020-12-17: 30 mL via ORAL

## 2020-12-17 MED ORDER — PRENATAL MULTIVITAMIN CH
1.0000 | ORAL_TABLET | Freq: Every day | ORAL | Status: DC
  Administered 2020-12-17 – 2020-12-22 (×6): 1 via ORAL
  Filled 2020-12-17 (×6): qty 1

## 2020-12-17 MED ORDER — FERROUS SULFATE 325 (65 FE) MG PO TABS
325.0000 mg | ORAL_TABLET | Freq: Every day | ORAL | Status: DC
  Administered 2020-12-17 – 2020-12-21 (×5): 325 mg via ORAL
  Filled 2020-12-17 (×5): qty 1

## 2020-12-17 MED ORDER — LIDOCAINE HCL (PF) 1 % IJ SOLN
30.0000 mL | INTRAMUSCULAR | Status: DC | PRN
  Filled 2020-12-17: qty 30

## 2020-12-17 MED ORDER — IRON-VITAMIN C 65-125 MG PO TABS: 1.0000 | ORAL_TABLET | Freq: Every day | ORAL | Status: DC

## 2020-12-17 MED ORDER — ASPIRIN EC 81 MG PO TBEC
81.0000 mg | DELAYED_RELEASE_TABLET | Freq: Every day | ORAL | Status: DC
  Administered 2020-12-17 – 2020-12-22 (×7): 81 mg via ORAL
  Filled 2020-12-17 (×10): qty 1

## 2020-12-17 MED ORDER — BETAMETHASONE SOD PHOS & ACET 6 (3-3) MG/ML IJ SUSP
12.0000 mg | INTRAMUSCULAR | Status: AC
  Administered 2020-12-17 – 2020-12-18 (×2): 12 mg via INTRAMUSCULAR
  Filled 2020-12-17: qty 5

## 2020-12-17 MED ORDER — LABETALOL HCL 200 MG PO TABS
200.0000 mg | ORAL_TABLET | Freq: Two times a day (BID) | ORAL | Status: DC
  Administered 2020-12-17 – 2020-12-20 (×8): 200 mg via ORAL
  Filled 2020-12-17 (×6): qty 1
  Filled 2020-12-17: qty 2
  Filled 2020-12-17: qty 1

## 2020-12-17 NOTE — H&P (Signed)
Samantha Glass is an 30 y.o. female.   Chief Complaint: headache HPI: Patient presented to L&D overnight with complaints of a new onset frontal headache. Has a history of CHTN controlled with Norvasc and Labetalol. She is pregnant with a di-di twin gestation. She denies vision changes. She denies right upper quadrant pain. Mild swelling in feet.   June 2022 Problems (from 06/21/20 to present)     Problem Noted Resolved   Proteinuria affecting pregnancy 08/25/2020 by Conard Novak, MD No   Chronic hypertension affecting pregnancy 06/28/2020 by Conard Novak, MD No   Obesity affecting pregnancy 06/28/2020 by Conard Novak, MD No   Supervision of high risk pregnancy, antepartum 06/21/2020 by Natale Milch, MD No   Overview Addendum 12/07/2020 10:21 AM by Vena Austria, MD     Nursing Staff Provider  Office Location  [facility] Dating   LMP  Language  English Anatomy US  complete  Flu Vaccine   Genetic Screen  NIPS: normal xx/xx  TDaP vaccine    Hgb A1C or  GTT Early : hgba1c 5.4 Third trimester : 90  Covid  vaccinated   LAB RESULTS   Rhogam  Not needed Blood Type   A+  Feeding Plan Breast Antibody  negative  Contraception Undecided- considering OCP Rubella  non immune  Circumcision  RPR  nonreactive  Pediatrician   HBsAg  nonreactive  Support Person Husband- Robert HIV  nonreactive  Prenatal Classes discussed Varicella  immune    GBS  (For PCN allergy, check sensitivities)   BTL Consent n/a    VBAC Consent n/a Pap  2021 NIL    Hgb Electro      CF      SMA        Diamniotic, Dichorionic pregnancy Chronic hypertension- on labetalol and Norvasc          Twin gestation, dichorionic/diamniotic (two placentae, two amniotic sacs) 06/21/2020 by Natale Milch, MD No   Overview Signed 06/21/2020 10:09 AM by Natale Milch, MD    Dichorionic Diamniotic Twin Pregnancy Plan  Mom should take 60-120mg  of elemental iron a day as well as 1mg  of folic  acid  18-20 wks:  Anatomy Ultrasound 20-Delivery: Growth Ultrasounds every 2-4 weeks** 36 wks: Initiate NSTs (initiate earlier if indicated)  Delivery No complications: 38 0/7 - 38 6/7 Isolated IUGR: 36 0/7- 37 6/7 IUGR with abnormal dopplers, hypertension, etc:  32 0/7 - 34 6/7             Past Medical History:  Diagnosis Date   Depression    Depression, major, single episode, 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   Hepatitis    middle school age   Hypertension    IDA (iron deficiency anemia) 04/14/2019   Obesity (BMI 30.0-34.9) 04/14/2019   UTI (urinary tract infection)     Past Surgical History:  Procedure Laterality Date   GALLBLADDER SURGERY  2021   TONSILLECTOMY  age 46   UMBILICAL HERNIA REPAIR N/A 11/24/2019   Procedure: HERNIA REPAIR UMBILICAL ADULT, open;  Surgeon: 11/26/2019, MD;  Location: [facility] ORS;  Service: General;  Laterality: N/A;    Family History  Problem Relation Age of Onset   Hypertension Mother    Depression Mother    Hypertension Maternal Grandmother    Arthritis Maternal Grandmother    Diabetes Paternal Grandmother    Hypertension Paternal Grandmother    Hyperlipidemia Paternal Grandmother    Hypertension Paternal Grandfather    Social History:  reports that she has never smoked. She has never used smokeless tobacco. She reports that she does not currently use alcohol. She reports that she does not use drugs.  Allergies: No Known Allergies  Medications Prior to Admission  Medication Sig Dispense Refill   amLODipine (NORVASC) 5 MG tablet Take 1 tablet (5 mg total) by mouth daily. 90 tablet 1   aspirin EC 81 MG tablet Take 81 mg by mouth daily. Swallow whole.     buPROPion (WELLBUTRIN XL) 300 MG 24 hr tablet TAKE 1 TABLET BY MOUTH EVERY DAY 90 tablet 3   Iron-Vitamin C (VITRON-C) 65-125 MG TABS Take 1 tablet by mouth at bedtime.     labetalol (NORMODYNE) 200 MG tablet Take 1 tablet (200 mg total) by mouth 2 (two) times daily. 180 tablet  1   Prenatal Vit-Fe Fumarate-FA (PRENATAL MULTIVITAMIN) TABS tablet Take 1 tablet by mouth daily at 12 noon.      Results for orders placed or performed during the hospital encounter of 12/17/20 (from the past 48 hour(s))  CBC with Differential/Platelet     Status: Abnormal   Collection Time: 12/17/20  3:03 AM  Result Value Ref Range   WBC 11.0 (H) 4.0 - 10.5 K/uL   RBC 3.73 (L) 3.87 - 5.11 MIL/uL   Hemoglobin 11.8 (L) 12.0 - 15.0 g/dL   HCT 34.1 (L) 36.0 - 46.0 %   MCV 91.4 80.0 - 100.0 fL   MCH 31.6 26.0 - 34.0 pg   MCHC 34.6 30.0 - 36.0 g/dL   RDW 12.4 11.5 - 15.5 %   Platelets 156 150 - 400 K/uL   nRBC 0.0 0.0 - 0.2 %   Neutrophils Relative % 67 %   Neutro Abs 7.5 1.7 - 7.7 K/uL   Lymphocytes Relative 23 %   Lymphs Abs 2.5 0.7 - 4.0 K/uL   Monocytes Relative 7 %   Monocytes Absolute 0.8 0.1 - 1.0 K/uL   Eosinophils Relative 2 %   Eosinophils Absolute 0.2 0.0 - 0.5 K/uL   Basophils Relative 0 %   Basophils Absolute 0.0 0.0 - 0.1 K/uL   Immature Granulocytes 1 %   Abs Immature Granulocytes 0.07 0.00 - 0.07 K/uL    Comment: Performed at [facility], [address] [place], [place] [address]  Comprehensive metabolic panel     Status: Abnormal   Collection Time: 12/17/20  3:03 AM  Result Value Ref Range   Sodium 129 (L) 135 - 145 mmol/L   Potassium 4.1 3.5 - 5.1 mmol/L   Chloride 102 98 - 111 mmol/L   CO2 22 22 - 32 mmol/L   Glucose, Bld 89 70 - 99 mg/dL    Comment: Glucose reference range applies only to samples taken after fasting for at least 8 hours.   BUN 12 6 - 20 mg/dL   Creatinine, Ser 0.58 0.44 - 1.00 mg/dL   Calcium 8.8 (L) 8.9 - 10.3 mg/dL   Total Protein 6.5 6.5 - 8.1 g/dL   Albumin 3.0 (L) 3.5 - 5.0 g/dL   AST 21 15 - 41 U/L   ALT 18 0 - 44 U/L   Alkaline Phosphatase 107 38 - 126 U/L   Total Bilirubin 0.6 0.3 - 1.2 mg/dL   GFR, Estimated >60 >60 mL/min    Comment: (NOTE) Calculated using the CKD-EPI Creatinine Equation (2021)    Anion gap 5  5 - 15    Comment: Performed at [facility], [address] [place],  [place] [address]  Protein / creatinine ratio, urine     Status: Abnormal   Collection Time: 12/17/20  3:03 AM  Result Value Ref Range   Creatinine, Urine 16 mg/dL   Total Protein, Urine 75 mg/dL    Comment: NO NORMAL RANGE ESTABLISHED FOR THIS TEST   Protein Creatinine Ratio 4.69 (H) 0.00 - 0.15 mg/mg[Cre]    Comment: Performed at [facility], [address] [place], [place] [address]  Type and screen [place]     Status: None   Collection Time: 12/17/20  4:18 AM  Result Value Ref Range   ABO/RH(D) A POS    Antibody Screen NEG    Sample Expiration      12/20/2020,2359 Performed at [facility], [address] [place], [place] [address]    No results found.  Review of Systems  Constitutional:  Negative for chills and fever.  HENT:  Negative for congestion, hearing loss and sinus pain.   Respiratory:  Negative for cough, shortness of breath and wheezing.   Cardiovascular:  Negative for chest pain, palpitations and leg swelling.  Gastrointestinal:  Negative for abdominal pain, constipation, diarrhea, nausea and vomiting.  Genitourinary:  Negative for dysuria, flank pain, frequency, hematuria and urgency.  Musculoskeletal:  Negative for back pain.  Skin:  Negative for rash.  Neurological:  Negative for dizziness and headaches.  Psychiatric/Behavioral:  Negative for suicidal ideas. The patient is not nervous/anxious.    Blood pressure (!) 146/95, pulse 76, temperature 98.8 F (37.1 C), temperature source Oral, resp. rate 16, height 5\' 11"  (1.803 m), weight 132.5 kg, last menstrual period 04/20/2020. Physical Exam Vitals and nursing note reviewed.  Constitutional:      Appearance: Normal appearance. She is well-developed.  HENT:     Head: Normocephalic and atraumatic.  Cardiovascular:     Rate and Rhythm: Normal rate and regular rhythm.  Pulmonary:      Effort: Pulmonary effort is normal.     Breath sounds: Normal breath sounds.  Abdominal:     General: Bowel sounds are normal.     Palpations: Abdomen is soft.  Musculoskeletal:        General: Normal range of motion.  Skin:    General: Skin is warm and dry.  Neurological:     Mental Status: She is alert and oriented to person, place, and time.  Psychiatric:        Behavior: Behavior normal.        Thought Content: Thought content normal.        Judgment: Judgment normal.     Assessment/Plan  30 y.o. G1P0 [redacted]w[redacted]d here for headache with moderate range blood pressure elevations, CHTN, and likely superimposed preeclampsia. Headache has improved with IV medication and fluids. Will continue observation and BP monitoring. Will give betamethasone. Discussed delivery timing between 34-37 weeks depending on if she develops severe features of preeclampsia such as a headache that does not resolve with treatment, severe range blood pressures, lab abnormalities, vision changes and RUQ pain.   Adrian Prows MD, [facility], [facility] 12/17/2020 6:24 AM

## 2020-12-17 NOTE — OB Triage Note (Addendum)
Pt is a G1P0 and [redacted]w[redacted]d with di/di twins. Pt reports headache "on and off the last day or two" but has not gone away tonight since 2230. Pt reports headache bilaterally and anterior and describes it as "dull and achy." Pt took 1,000mg  of Tylenol prior to hospital arrival. Pt reports pain 2/10 on a 0-10 pain scale. Pt denies VB, LOF and ctx. Pt confirms positive fetal movement. Monitors applied and assessing.

## 2020-12-17 NOTE — Progress Notes (Signed)
Discussed plan of care w pt and spouse Dx: cHTN w Superimposed Preeclampsia, 34 3/7 weeks  Options of delivery as treatment for severe preeclampsia discussed.  She does not meet severe criteria at this time. However, she has had sustained pattern of elevated BPs. Will provide BMX for fetal lung maturity, with the hope for at least 48 hours to optimize effect. Secondary goal would be 36 weeks then IOL. Pt desires vaginal delivery if possible.  Would plan IOL when need for delivery presents itself.  If any sign of severe criteria or worsening preeclampsia develop then will plan delivery. I have counseled that she should plan to not go home until after delivery. Will plan NST 3 times daily. Will plan labs tomorrow and then every third day as long as she is still pregnant. Will plan follow up US on Monday (last [place] Wednesday, nml AFI x2 and Vtx/Vtx). Will plan Neonatal counsult to go over prematurity possibilities post delivery All questions answered at this time  Annamarie Major, MD, [facility], [facility] 12/17/2020  9:19 AM

## 2020-12-18 LAB — CBC
HCT: 32 % — ABNORMAL LOW (ref 36.0–46.0)
Hemoglobin: 11.2 g/dL — ABNORMAL LOW (ref 12.0–15.0)
MCH: 32 pg (ref 26.0–34.0)
MCHC: 35 g/dL (ref 30.0–36.0)
MCV: 91.4 fL (ref 80.0–100.0)
Platelets: 147 10*3/uL — ABNORMAL LOW (ref 150–400)
RBC: 3.5 MIL/uL — ABNORMAL LOW (ref 3.87–5.11)
RDW: 12.5 % (ref 11.5–15.5)
WBC: 13.4 10*3/uL — ABNORMAL HIGH (ref 4.0–10.5)
nRBC: 0 % (ref 0.0–0.2)

## 2020-12-18 LAB — COMPREHENSIVE METABOLIC PANEL
ALT: 16 U/L (ref 0–44)
AST: 18 U/L (ref 15–41)
Albumin: 2.8 g/dL — ABNORMAL LOW (ref 3.5–5.0)
Alkaline Phosphatase: 102 U/L (ref 38–126)
Anion gap: 8 (ref 5–15)
BUN: 11 mg/dL (ref 6–20)
CO2: 17 mmol/L — ABNORMAL LOW (ref 22–32)
Calcium: 8.6 mg/dL — ABNORMAL LOW (ref 8.9–10.3)
Chloride: 104 mmol/L (ref 98–111)
Creatinine, Ser: 0.58 mg/dL (ref 0.44–1.00)
GFR, Estimated: >60 mL/min (ref >60)
Glucose, Bld: 91 mg/dL (ref 70–99)
Potassium: 3.9 mmol/L (ref 3.5–5.1)
Sodium: 129 mmol/L — ABNORMAL LOW (ref 135–145)
Total Bilirubin: 0.4 mg/dL (ref 0.3–1.2)
Total Protein: 5.9 g/dL — ABNORMAL LOW (ref 6.5–8.1)

## 2020-12-18 MED ORDER — ACETAMINOPHEN 325 MG PO TABS
650.0000 mg | ORAL_TABLET | Freq: Four times a day (QID) | ORAL | Status: DC | PRN
  Administered 2020-12-18 (×2): 650 mg via ORAL
  Filled 2020-12-18 (×2): qty 2

## 2020-12-18 MED ORDER — POTASSIUM CHLORIDE IN NACL 20-0.9 MEQ/L-% IV SOLN
INTRAVENOUS | Status: DC
  Filled 2020-12-18 (×4): qty 1000

## 2020-12-18 NOTE — Progress Notes (Signed)
Patient to room 350 AA via wheelchair accompanied by husband and Lula Olszewski RN.

## 2020-12-18 NOTE — Progress Notes (Signed)
Pt brought to L&D for NST. NST reactive and appropriate for gestational age. Tiburcio Pea, MD reviewed strip. No ob concerns at this time. Pt returned to [facility] Rm 348. Ann Held aware.

## 2020-12-18 NOTE — Progress Notes (Signed)
RN arrived to pt room for vitals and to recheck pt hands. Edema seems to have improved. On-going 1/10 headache, LE edema remains the same. Educated pt and significant other on importance to monitor any changes. Will continue to monitor.

## 2020-12-18 NOTE — Progress Notes (Signed)
This RN performed NST per order on pt at this time. NST showed reactive strip with good accelerations. Kaley,RN reviewed strip with this RN. NST strip placed in chart at this time.

## 2020-12-18 NOTE — Progress Notes (Signed)
24 hour urine placed in bathroom and documented start time for 12 noon. Educated patient and patient verbalized understanding. Pt complaining of dizziness, BP 146/94. Dr. Tiburcio Pea notified. No new orders at this time.

## 2020-12-18 NOTE — Consult Note (Signed)
Neonatology Consult to Antenatal Patient:  I was asked by Dr. Tiburcio Pea to see this patient in order to provide antenatal counseling due to prematurity.  Mrs Brubacher was admitted 12/17/20 at 34 3/[redacted] weeks GA with didi twins due to concerns for superimposed pre-eclampsia.   Mother with complications of obesity, cHTN on lab/amlod, and depression (Wellbutrin)  She is currently not having active labor. No ROM, bleeding or fetal concerns.  BPP reassuring.  She is now BMZ complete and receving IV BP meds.  Husband present and they have good support.    I spoke with the patient and husband. We discussed the worst case of delivery in the next 1-2 days, including usual DR management, possible respiratory complications and need for support, IV access, feedings (mother desires breast feeding, which was encouraged and they agree to [facility]), LOS, Mortality and Morbidity, including long term outcomes. They did have usual questions at this time which I was able to answer.  I/we would be glad to come back if they have more questions later.  Hopefully, pregnancy will extend beyond 35 weeks.    Thank you for asking me to see this patient.  Dineen Kid Leary Roca, MD Neonatologist  The total length of face-to-face or floor/unit time for this encounter was 25 minutes. Counseling and/or coordination of care was 40 minutes of the above.

## 2020-12-18 NOTE — Progress Notes (Signed)
Progress Note  Admission Date: 12/17/2020 Current Date: 12/18/2020  Samantha Glass is a 30 y.o. G1P0 HD#2 @ [redacted]w[redacted]d with twins, cHTN, and superimposed preeclampsia.   History complicated by: Patient Active Problem List   Diagnosis Date Noted   Headache 12/17/2020   Pregnancy 12/17/2020   Preeclampsia 12/17/2020   Labor and delivery indication for care or intervention 12/11/2020   Abdominal pain during pregnancy in third trimester 11/30/2020   Hypertension in pregnancy 11/08/2020   Vaginal discharge during pregnancy 10/05/2020   Proteinuria affecting pregnancy 08/25/2020   Chronic hypertension affecting pregnancy 06/28/2020   Obesity affecting pregnancy 06/28/2020   Supervision of high risk pregnancy, antepartum 06/21/2020   Twin gestation, dichorionic/diamniotic (two placentae, two amniotic sacs) 06/21/2020   Mouth lesion 02/05/2020   Umbilical hernia without obstruction and without gangrene    Hypokalemia 10/30/2019   Symptomatic cholelithiasis 10/30/2019   BMI 35.0-35.9,adult 10/16/2019   Elevated C-reactive protein (CRP) 10/16/2019   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 Headache is low grade, 1/10 pain scale, and intermittent Denies blurry vision, CP, SOB, epigastric pain.  Objective:   Vitals:   12/17/20 2318 12/18/20 0353 12/18/20 0749 12/18/20 1129  BP: 136/83 125/88 118/83 (!) 142/87  Pulse: 85 95 96  (!) 107  Resp: 19 17 18 18   Temp: 97.7 F (36.5 C) 98.7 F (37.1 C) 98.1 F (36.7 C) 98.7 F (37.1 C)  TempSrc: Oral Oral Oral Oral  SpO2: 97% 97% 97% 98%  Weight:      Height:       Physical exam: General appearance: alert, cooperative, and appears stated age Abdomen:  gravid, NT GU: No gross VB Lungs: clear to auscultation bilaterally Heart: regular rate and rhythm and no MRGs Extremities: no redness or tenderness in the calves or thighs, edema Trace Skin: no lesions Psych: appropriate  Recent Labs  Lab 12/12/20 0008 12/17/20 0303 12/17/20 1359 12/18/20 0610  NA 132* 129* 128* 129*  K 3.8 4.1 5.1 3.9  CL 105 102 104 104  CO2 21* 22 19* 17*  BUN 10 12  --  11  CREATININE 0.59 0.58  --  0.58  GLUCOSE 98 89  --  91   Recent Labs  Lab 12/12/20 0008 12/17/20 0303 12/18/20 0610  WBC 9.1 11.0* 13.4*  HGB 11.2* 11.8* 11.2*  HCT 32.8* 34.1* 32.0*  PLT 152 156 147*    Recent Labs  Lab 12/12/20 0008 12/17/20 0303 12/18/20 0610  CALCIUM 8.5* 8.8* 8.6*   No results for input(s): INR, APTT in the last 168 hours.    Recent Labs  Lab 12/12/20 0008 12/17/20 0303 12/18/20 0610  ALKPHOS 112 107 102  BILITOT 0.4 0.6 0.4  PROT 6.0* 6.5 5.9*  ALT 19 18 16   AST 18 21 18     Recent Labs  Lab 12/12/20 0008 12/17/20 0303 12/18/20 0610  WBC 9.1 11.0* 13.4*  HGB 11.2* 11.8* 11.2*  HCT 32.8* 34.1* 32.0*  PLT 152 156 147*  Assessment & Plan: 34 4/7 weeks Twin pregnancy w/ Preeclampsia   Problem List Items Addressed This Visit       Chronic hypertension affecting pregnancy    Relevant Medications   amLODipine (NORVASC) tablet 5 mg   labetalol (NORMODYNE) tablet 200 mg   aspirin EC tablet 81 mg   Preeclampsia   Relevant Orders   aspirin EC tablet 81 mg 24 hour urine started today NST 3 x daily [place] this week     Supervision of high risk pregnancy, antepartum    BMZ two doses have been administered    Changing IVF to NS as has lower Na today by labs     Avoiding fluid overload, rate at 25 mL/hr   Twin gestation, dichorionic/diamniotic (two placentae, two amniotic sacs)    Plan delivery 36 weeks, sooner as preeclampsia worsens/ severe criteria develops    Labs reviewed, mild change in platelets, otherwise no change; normal liver testing    Neonatology consulted to advise on risks of prematurity    Patient has also been counseled (by self) as to the risks of prematurity, including risks of respiratory depression or distress, jaundice, feeding or temperature regulation problems, neurologic concerns including hearing or visual problems, and brain complications.  Patient understands the risks of tocolytic therapies along with their inherent failure rates, and the reasons for and against their use in individual circumstances.     Obesity affecting pregnancy   Barnett Applebaum, MD, [facility], [facility] 12/18/2020  12:04 PM

## 2020-12-18 NOTE — Progress Notes (Signed)
Pt called out to nurse for assistance. After arriving pt states she feels like her wrists and hands are swollen. Pt took watch off and noticed it left an indentation on her wrist. Upon assessment RN noticed non-pitting edema bilaterally on hands that did not extend to the fingers or beyond the wrists. No facial edema. No vision changes, headaches remains at a 1/10 and denies RUQ pain. LE edema remains the same. No other changes. Pt states she has also been ambulatory and was still standing upon RN arrival to room. Will continue to monitor.

## 2020-12-18 NOTE — Progress Notes (Signed)
Edema in hands and wrists resolved, LE edema remains at +1 and headache also remains at 1/10.

## 2020-12-18 NOTE — Progress Notes (Signed)
Patient to [facility] via wheelchair accompanied by husband and Lula Olszewski RN.

## 2020-12-19 ENCOUNTER — Encounter: Payer: Self-pay | Admitting: Obstetrics and Gynecology

## 2020-12-19 DIAGNOSIS — O1493 Unspecified pre-eclampsia, third trimester: Secondary | ICD-10-CM

## 2020-12-19 DIAGNOSIS — O30043 Twin pregnancy, dichorionic/diamniotic, third trimester: Secondary | ICD-10-CM

## 2020-12-19 DIAGNOSIS — Z3A34 34 weeks gestation of pregnancy: Secondary | ICD-10-CM

## 2020-12-19 LAB — RPR: RPR Ser Ql: NONREACTIVE

## 2020-12-19 LAB — PROTEIN, URINE, 24 HOUR
Collection Interval-UPROT: 24 hours
Protein, 24H Urine: 714 mg/d — ABNORMAL HIGH (ref 50–100)
Protein, Urine: 34 mg/dL
Urine Total Volume-UPROT: 2100 mL

## 2020-12-19 NOTE — OB Triage Note (Signed)
Pt to L&D for NST.

## 2020-12-19 NOTE — Progress Notes (Addendum)
Pt reports a HA 1 out of 10 on pain scale. She hasn't taken any meds, she wants to see if breakfast helps first. Denies vision changes, and epigastric pain. No new swelling of lower extremities.   NST: Reactive Cat 1 Strip, back to M/B room

## 2020-12-19 NOTE — Progress Notes (Signed)
Obstetric and Gynecology  Subjective  Doing well.  Headaches has improved since admission, as have BP's.  No VC, RUQ, or epigastric pain.  +FM, no LOF, no VB, no ctx  Objective  Vital signs in last 24 hours: Temp:  [97.7 F (36.5 C)-98.7 F (37.1 C)] 98 F (36.7 C) (12/19 0754) Pulse Rate:  [82-107] 82 (12/19 0754) Resp:  [18-20] 18 (12/19 0754) BP: (123-146)/(70-94) 123/70 (12/19 0754) SpO2:  [96 %-99 %] 99 % (12/19 0754) Last BM Date: 12/18/20   Intake/Output Summary (Last 24 hours) at 12/19/2020 0856 Last data filed at 12/19/2020 0800 Gross per 24 hour  Intake 1188.43 ml  Output 1550 ml  Net -361.57 ml    General: NAD Abdomen: gravid, non-tender Extremities: trace pretibial edema  Labs: No results found for this or any previous visit (from the past 24 hour(s)).  Cultures: Results for orders placed or performed during the hospital encounter of 12/17/20  Group B strep by PCR     Status: None   Collection Time: 12/17/20  6:32 AM   Specimen: Vaginal/Rectal; Genital  Result Value Ref Range Status   Group B strep by PCR NEGATIVE NEGATIVE Final    Comment: (NOTE) Intrapartum testing with Xpert GBS assay should be used as an adjunct to other methods available and not used to replace antepartum testing (at 35-[redacted] weeks gestation). Performed at [facility], [address] [place], [place] [address]   Resp Panel by RT-PCR (Flu A&B, Covid) Nasopharyngeal Swab     Status: None   Collection Time: 12/17/20  9:59 AM   Specimen: Nasopharyngeal Swab; Nasopharyngeal(NP) swabs in vial transport medium  Result Value Ref Range Status   SARS Coronavirus 2 by RT PCR NEGATIVE NEGATIVE Final    Comment: (NOTE) SARS-CoV-2 target nucleic acids are NOT DETECTED.  The SARS-CoV-2 RNA is generally detectable in upper respiratory specimens during the acute phase of infection. The lowest concentration of SARS-CoV-2 viral copies this assay can detect is 138 copies/mL. A negative  result does not preclude SARS-Cov-2 infection and should not be used as the sole basis for treatment or other patient management decisions. A negative result may occur with  improper specimen collection/handling, submission of specimen other than nasopharyngeal swab, presence of viral mutation(s) within the areas targeted by this assay, and inadequate number of viral copies(<138 copies/mL). A negative result must be combined with clinical observations, patient history, and epidemiological information. The expected result is Negative.  Fact Sheet for Patients:  BloggerCourse.com  Fact Sheet for Healthcare Providers:  SeriousBroker.it  This test is no t yet approved or cleared by the [place] FDA and  has been authorized for detection and/or diagnosis of SARS-CoV-2 by FDA under an Emergency Use Authorization (EUA). This EUA will remain  in effect (meaning this test can be used) for the duration of the COVID-19 declaration under Section 564(b)(1) of the Act, 21 U.S.C.section 360bbb-3(b)(1), unless the authorization is terminated  or revoked sooner.       Influenza A by PCR NEGATIVE NEGATIVE Final   Influenza B by PCR NEGATIVE NEGATIVE Final    Comment: (NOTE) The Xpert Xpress SARS-CoV-2/FLU/RSV plus assay is intended as an aid in the diagnosis of influenza from Nasopharyngeal swab specimens and should not be used as a sole basis for treatment. Nasal washings and aspirates are unacceptable for Xpert Xpress SARS-CoV-2/FLU/RSV testing.  Fact Sheet for Patients: BloggerCourse.com  Fact Sheet for Healthcare Providers: SeriousBroker.it  This test is not yet approved or cleared by  the [employer] and has been authorized for detection and/or diagnosis of SARS-CoV-2 by FDA under an Emergency Use Authorization (EUA). This EUA will remain in effect (meaning this test can be used)  for the duration of the COVID-19 declaration under Section 564(b)(1) of the Act, 21 U.S.C. section 360bbb-3(b)(1), unless the authorization is terminated or revoked.  Performed at [facility], [address], [place], [place] [address]     Imaging:  Assessment   30 y.o. G1P0 at [redacted]w[redacted]d by Estimated Date of Delivery: 01/25/21 with Di/Di twins admitted for superimposed preeclampsia  Plan    1) Superimposed preeclampsia - daily NST, daily weights, and labs q48-hrs - s/p BMZ x 2 (12/17 & 12/18) - contiinue Norvasc 5mg  oral daily, labetalol 200mg  po bid - BPP tomorrow (she is due for growth scan in 1 week, has been 3 weeks since last growth scan) - 24-hr urine protein collection finishes at noon - We discussed that persistent or worsening headache, acute escalation in hypertensive treatment, laboratory abnormalities (other than proteinuria), or fetal indication may all necessitate delivery prior to 36 weeks should they develop.

## 2020-12-19 NOTE — Progress Notes (Signed)
°   12/18/20 2244  Fetal Heart Rate A  Mode External  Baseline Rate (A) 135 bpm  Variability 6-25 BPM  Accelerations 15 x 15  Decelerations None  Fetal Heart Rate Fetus B  Mode External  Baseline Rate (B) 130 BPM  Variability 6-25 BPM  Accelerations 15 x 15  Decelerations None    FHR tracings for baby A and baby B are category 1 tracings. Reactive NSTs. No contractions noted.

## 2020-12-19 NOTE — Progress Notes (Incomplete)
°   12/18/20 2244  Fetal Heart Rate A  Mode External  Baseline Rate (A) 135 bpm  Variability 6-25 BPM  Accelerations 15 x 15  Decelerations None  Fetal Heart Rate Fetus B  Mode External  Baseline Rate (B) 130 BPM  Variability 6-25 BPM  Accelerations 15 x 15  Decelerations None   FHR tracings for baby A and for baby B are ca

## 2020-12-20 ENCOUNTER — Inpatient Hospital Stay: Payer: Managed Care, Other (non HMO)

## 2020-12-20 DIAGNOSIS — O1493 Unspecified pre-eclampsia, third trimester: Secondary | ICD-10-CM

## 2020-12-20 DIAGNOSIS — O30043 Twin pregnancy, dichorionic/diamniotic, third trimester: Secondary | ICD-10-CM

## 2020-12-20 DIAGNOSIS — Z3A34 34 weeks gestation of pregnancy: Secondary | ICD-10-CM

## 2020-12-20 LAB — CBC
HCT: 32 % — ABNORMAL LOW (ref 36.0–46.0)
Hemoglobin: 11 g/dL — ABNORMAL LOW (ref 12.0–15.0)
MCH: 31.5 pg (ref 26.0–34.0)
MCHC: 34.4 g/dL (ref 30.0–36.0)
MCV: 91.7 fL (ref 80.0–100.0)
Platelets: 146 10*3/uL — ABNORMAL LOW (ref 150–400)
RBC: 3.49 MIL/uL — ABNORMAL LOW (ref 3.87–5.11)
RDW: 12.8 % (ref 11.5–15.5)
WBC: 14.2 10*3/uL — ABNORMAL HIGH (ref 4.0–10.5)
nRBC: 0.2 % (ref 0.0–0.2)

## 2020-12-20 LAB — COMPREHENSIVE METABOLIC PANEL
ALT: 14 U/L (ref 0–44)
AST: 20 U/L (ref 15–41)
Albumin: 2.9 g/dL — ABNORMAL LOW (ref 3.5–5.0)
Alkaline Phosphatase: 105 U/L (ref 38–126)
Anion gap: 6 (ref 5–15)
BUN: 11 mg/dL (ref 6–20)
CO2: 21 mmol/L — ABNORMAL LOW (ref 22–32)
Calcium: 8.6 mg/dL — ABNORMAL LOW (ref 8.9–10.3)
Chloride: 106 mmol/L (ref 98–111)
Creatinine, Ser: 0.57 mg/dL (ref 0.44–1.00)
GFR, Estimated: >60 mL/min (ref >60)
Glucose, Bld: 84 mg/dL (ref 70–99)
Potassium: 4.3 mmol/L (ref 3.5–5.1)
Sodium: 133 mmol/L — ABNORMAL LOW (ref 135–145)
Total Bilirubin: 0.6 mg/dL (ref 0.3–1.2)
Total Protein: 5.6 g/dL — ABNORMAL LOW (ref 6.5–8.1)

## 2020-12-20 NOTE — Progress Notes (Addendum)
Samantha Glass is a 30 yo Gravida 1 para 0, admittted with Mercie Eon twin gestation  at 34 weeks 6 days with a diagnosis of superimposed pre eclampsia.   Today, she denies any headache; can feel both twins moving well, and has had a reactive NST. She is heading to ultraslund for a limited sono (AFI). Plans for a BPP in two days with MFM. She denies any contractions, LOF, or vaginal bleeding. She also denies any signs of increased edema  Subjective: Patient reports tolerating PO and no problems voiding.  She has been resting quietly this morning.  Objective: I have reviewed patient's vital signs, intake and output, and labs. BP (!) 129/91 (BP Location: Left Arm)    Pulse 87    Temp (!) 97.5 F (36.4 C) (Oral)    Resp 18    Ht 5\' 11"  (1.803 m)    Wt 132.5 kg    LMP 04/20/2020    SpO2 97%    BMI 40.73 kg/m  Results for orders placed or performed during the hospital encounter of 12/17/20 (from the past 24 hour(s))  Comprehensive metabolic panel     Status: Abnormal   Collection Time: 12/20/20  5:49 AM  Result Value Ref Range   Sodium 133 (L) 135 - 145 mmol/L   Potassium 4.3 3.5 - 5.1 mmol/L   Chloride 106 98 - 111 mmol/L   CO2 21 (L) 22 - 32 mmol/L   Glucose, Bld 84 70 - 99 mg/dL   BUN 11 6 - 20 mg/dL   Creatinine, Ser 12/22/20 0.44 - 1.00 mg/dL   Calcium 8.6 (L) 8.9 - 10.3 mg/dL   Total Protein 5.6 (L) 6.5 - 8.1 g/dL   Albumin 2.9 (L) 3.5 - 5.0 g/dL   AST 20 15 - 41 U/L   ALT 14 0 - 44 U/L   Alkaline Phosphatase 105 38 - 126 U/L   Total Bilirubin 0.6 0.3 - 1.2 mg/dL   GFR, Estimated 7.86 >76 mL/min   Anion gap 6 5 - 15  CBC     Status: Abnormal   Collection Time: 12/20/20  5:49 AM  Result Value Ref Range   WBC 14.2 (H) 4.0 - 10.5 K/uL   RBC 3.49 (L) 3.87 - 5.11 MIL/uL   Hemoglobin 11.0 (L) 12.0 - 15.0 g/dL   HCT 12/22/20 (L) 09.4 - 70.9 %   MCV 91.7 80.0 - 100.0 fL   MCH 31.5 26.0 - 34.0 pg   MCHC 34.4 30.0 - 36.0 g/dL   RDW 62.8 36.6 - 29.4 %   Platelets 146 (L) 150 - 400 K/uL   nRBC  0.2 0.0 - 0.2 %     General: alert, cooperative, no distress, and moderately obese Resp: clear to auscultation bilaterally Cardio: regular rate and rhythm, S1, S2 normal, no murmur, click, rub or gallop Extremities: extremities normal, atraumatic, no cyanosis or edema NST - see report- reactive .  Assessment/Plan: IUP 76.5 twins 34 weeks 6 days  Superimposed pre eclmapsia  Continuing to monitor blood pressures. Norvasc daily and labetalol BID to continue. Reactive NST noted  She is taken to ultrasound for an AFI Daily NSTs POC has been discussed with MFM. Plan for delivery per MFM. Has MFM appointment for two days hence.   Mercie Eon, CNM  12/20/2020 1:26 PM    LOS: 3 days    12/22/2020 12/20/2020, 1:21 PM

## 2020-12-20 NOTE — Progress Notes (Signed)
Pt to L/D for scheduled NST. Pt reports no pain, bleeding, or LOF and positive fetal movement. Monitors applied and assessing.

## 2020-12-21 DIAGNOSIS — E669 Obesity, unspecified: Secondary | ICD-10-CM

## 2020-12-21 DIAGNOSIS — O99213 Obesity complicating pregnancy, third trimester: Secondary | ICD-10-CM

## 2020-12-21 DIAGNOSIS — Z3A35 35 weeks gestation of pregnancy: Secondary | ICD-10-CM

## 2020-12-21 MED ORDER — HYDRALAZINE HCL 20 MG/ML IJ SOLN: 10.0000 mg | INTRAMUSCULAR | Status: DC | PRN

## 2020-12-21 MED ORDER — LABETALOL HCL 5 MG/ML IV SOLN: 40.0000 mg | INTRAVENOUS | Status: DC | PRN

## 2020-12-21 MED ORDER — LABETALOL HCL 200 MG PO TABS
300.0000 mg | ORAL_TABLET | Freq: Two times a day (BID) | ORAL | Status: DC
  Administered 2020-12-21: 09:00:00 200 mg via ORAL
  Administered 2020-12-21: 09:00:00 100 mg via ORAL
  Administered 2020-12-21 – 2020-12-26 (×9): 300 mg via ORAL
  Filled 2020-12-21 (×5): qty 1
  Filled 2020-12-21 (×2): qty 3
  Filled 2020-12-21: qty 1
  Filled 2020-12-21 (×2): qty 3

## 2020-12-21 MED ORDER — LABETALOL HCL 5 MG/ML IV SOLN: 80.0000 mg | INTRAVENOUS | Status: DC | PRN

## 2020-12-21 MED ORDER — LABETALOL HCL 5 MG/ML IV SOLN: 20.0000 mg | INTRAVENOUS | Status: DC | PRN

## 2020-12-21 NOTE — Progress Notes (Signed)
Progress Note  Admission Date: 12/17/2020 Current Date: 12/21/2020  Samantha Glass is a 30 y.o. G1P0 HD#4 @ [redacted]w[redacted]d with Preeclampsia and Twins.   History complicated by: Patient Active Problem List   Diagnosis Date Noted   Headache 12/17/2020   Pregnancy 12/17/2020   Preeclampsia 12/17/2020   Labor and delivery indication for care or intervention 12/11/2020   Abdominal pain during pregnancy in third trimester 11/30/2020   Hypertension in pregnancy 11/08/2020   Vaginal discharge during pregnancy 10/05/2020   Proteinuria affecting pregnancy 08/25/2020   Chronic hypertension affecting pregnancy 06/28/2020   Obesity affecting pregnancy 06/28/2020   Supervision of high risk pregnancy, antepartum 06/21/2020   Twin gestation, dichorionic/diamniotic (two placentae, two amniotic sacs) 06/21/2020   Mouth lesion 02/05/2020   Umbilical hernia without obstruction and without gangrene    Hypokalemia 10/30/2019   Symptomatic cholelithiasis 10/30/2019   BMI 35.0-35.9,adult 10/16/2019   Elevated C-reactive protein (CRP) 10/16/2019   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  This makes her anxious.  She denies headache, blurry vision, CP, SOB, epigastric pain, edema.  Good FM x2.  Denies signs of labor.  Objective:   Vitals:   12/20/20 1930 12/20/20 2245 12/21/20 0030 12/21/20 0307  BP: (!) 141/95 (!) 145/94 (!) 150/98 (!) 141/94  Pulse: 80 77 75 78  Resp: 18 18  18    Temp: 98.2 F (36.8 C) 98.2 F (36.8 C)  98.3 F (36.8 C)  TempSrc: Oral Oral  Oral  SpO2: 99% 99%  98%  Weight:      Height:       Physical exam: General appearance: alert, cooperative, and no distress Abdomen:  gravid NT GU: No gross VB Lungs: clear to auscultation bilaterally Heart: regular rate and rhythm and no MRGs Extremities: no redness or tenderness in the calves or thighs, TRACE edema Skin: no lesions Psych: appropriate  Recent Labs  Lab 12/17/20 0303 12/17/20 1359 12/18/20 0610 12/20/20 0549  NA 129* 128* 129* 133*  K 4.1 5.1 3.9 4.3  CL 102 104 104 106  CO2 22 19* 17* 21*  BUN 12  --  11 11  CREATININE 0.58  --  0.58 0.57  GLUCOSE 89  --  91 84   Recent Labs  Lab 12/17/20 0303 12/18/20 0610 12/20/20 0549  WBC 11.0* 13.4* 14.2*  HGB 11.8* 11.2* 11.0*  HCT 34.1* 32.0* 32.0*  PLT 156 147* 146*    Recent Labs  Lab 12/17/20 0303 12/18/20 0610 12/20/20 0549  CALCIUM 8.8* 8.6* 8.6*   No results for input(s): INR, APTT in the last 168 hours.    Recent Labs  Lab 12/17/20 0303 12/18/20 0610 12/20/20 0549  ALKPHOS 107 102 105  BILITOT 0.6 0.4 0.6  PROT 6.5 5.9* 5.6*  ALT 18 16 14   AST 21 18 20     Recent Labs  Lab 12/17/20 0303 12/18/20 0610 12/20/20 0549  WBC 11.0* 13.4* 14.2*  HGB 11.8* 11.2* 11.0*  HCT 34.1* 32.0* 32.0*  PLT 156 147* 146*   Recent Labs  Lab 12/17/20 0303 12/17/20 1359 12/18/20 0610 12/20/20 0549  NA 129* 128* 129* 133*  K 4.1 5.1 3.9 4.3  CL 102 104 104 106  CO2 22 19* 17* 21*  BUN 12  --  11 11  CREATININE 0.58  --  0.58 0.57  CALCIUM 8.8*  --  8.6* 8.6*  PROT 6.5  --  5.9* 5.6*  BILITOT 0.6  --  0.4 0.6  ALKPHOS 107  --  102 105  ALT 18  --  16 14  AST 21  --  18 20  GLUCOSE 89  --  91 84    Assessment & Plan:   Problem List Items Addressed This Visit       Cardiovascular and Mediastinum   Chronic hypertension affecting pregnancy - Primary   Relevant Medications   amLODipine (NORVASC) tablet 5  mg   labetalol (NORMODYNE) tablet 200 mg   aspirin EC tablet 81 mg   Preeclampsia   Relevant Medications   Monitor for s/sx worsening preeclampsia   She has some elevations that usually normalize w rest.  Will cont to monitor this.  She is on regimen of antihypertensives for her cHTN,and if BPs continue to rise despite this medicine, I am more inclined to plan delivery than up the doses or adjust medicine.  The risks of superimposed preeclampsia outweigh the risks of prematurity at this point, if sx's were to worsen   aspirin EC tablet 81 mg   Other Relevant Orders   US OB Limited (Completed)   US OB Limited (Completed) MFM BPP tomorrow  NST daily Pt is s/p BMZ x2 doses  Lab review, no changes. Also, 24 hour urine was 700 range, not severe range or worsening compared to prior P/C ratio  Monitor for symptoms of preeclampsia          Supervision of high risk pregnancy, antepartum   Twin gestation, dichorionic/diamniotic (two placentae, two amniotic sacs)   Obesity affecting pregnancy     Barnett Applebaum, MD, [place] 12/21/2020  7:41 AM

## 2020-12-21 NOTE — Progress Notes (Signed)
Discussion w pt and husband as to preeclampsia, IOL, prematurity, and many aspects of this decision making and planning. Explained the rationale for each decision, and the gray areas for some management options (such as escalating dose of antihypertensives, vs moving towards delivery because pressures rising).   Also discussed value of MFM opinion as well (tomorrow appt). We are prepared for IOL for hopeful vaginal delivery of twins at 36 weeks or when signs of severe preeclampsia develop.  The options for management w IOL discussed, as well as pain management and epidural.  A total of 30 minutes were spent face-to-face with the patient as well as preparation, review, communication, and documentation during this encounter.   Annamarie Major, MD, [facility], [facility] 12/21/2020  6:11 PM

## 2020-12-22 ENCOUNTER — Other Ambulatory Visit: Payer: Self-pay

## 2020-12-22 ENCOUNTER — Encounter: Payer: Managed Care, Other (non HMO) | Admitting: Obstetrics and Gynecology

## 2020-12-22 ENCOUNTER — Inpatient Hospital Stay (HOSPITAL_BASED_OUTPATIENT_CLINIC_OR_DEPARTMENT_OTHER): Payer: Managed Care, Other (non HMO)

## 2020-12-22 DIAGNOSIS — O10913 Unspecified pre-existing hypertension complicating pregnancy, third trimester: Secondary | ICD-10-CM

## 2020-12-22 DIAGNOSIS — Z3A35 35 weeks gestation of pregnancy: Secondary | ICD-10-CM | POA: Diagnosis not present

## 2020-12-22 DIAGNOSIS — O30043 Twin pregnancy, dichorionic/diamniotic, third trimester: Secondary | ICD-10-CM

## 2020-12-22 DIAGNOSIS — O10013 Pre-existing essential hypertension complicating pregnancy, third trimester: Secondary | ICD-10-CM

## 2020-12-22 DIAGNOSIS — Z3A34 34 weeks gestation of pregnancy: Secondary | ICD-10-CM

## 2020-12-22 DIAGNOSIS — O1403 Mild to moderate pre-eclampsia, third trimester: Secondary | ICD-10-CM

## 2020-12-22 DIAGNOSIS — O1413 Severe pre-eclampsia, third trimester: Secondary | ICD-10-CM

## 2020-12-22 LAB — CBC
HCT: 35.4 % — ABNORMAL LOW (ref 36.0–46.0)
Hemoglobin: 12 g/dL (ref 12.0–15.0)
MCH: 31.3 pg (ref 26.0–34.0)
MCHC: 33.9 g/dL (ref 30.0–36.0)
MCV: 92.2 fL (ref 80.0–100.0)
Platelets: 148 10*3/uL — ABNORMAL LOW (ref 150–400)
RBC: 3.84 MIL/uL — ABNORMAL LOW (ref 3.87–5.11)
RDW: 12.9 % (ref 11.5–15.5)
WBC: 13 10*3/uL — ABNORMAL HIGH (ref 4.0–10.5)
nRBC: 0.2 % (ref 0.0–0.2)

## 2020-12-22 LAB — TYPE AND SCREEN
ABO/RH(D): A POS
Antibody Screen: NEGATIVE

## 2020-12-22 MED ORDER — LABETALOL HCL 5 MG/ML IV SOLN: 40.0000 mg | INTRAVENOUS | Status: DC | PRN

## 2020-12-22 MED ORDER — MISOPROSTOL 200 MCG PO TABS
ORAL_TABLET | ORAL | Status: AC
  Filled 2020-12-22: qty 4

## 2020-12-22 MED ORDER — LACTATED RINGERS IV SOLN: INTRAVENOUS | Status: DC

## 2020-12-22 MED ORDER — CALCIUM GLUCONATE 10 % IV SOLN
INTRAVENOUS | Status: AC
  Filled 2020-12-22: qty 10

## 2020-12-22 MED ORDER — OXYTOCIN BOLUS FROM INFUSION
333.0000 mL | Freq: Once | INTRAVENOUS | Status: DC
  Administered 2020-12-24: 333 mL via INTRAVENOUS

## 2020-12-22 MED ORDER — AMMONIA AROMATIC IN INHA
RESPIRATORY_TRACT | Status: AC
  Filled 2020-12-22: qty 10

## 2020-12-22 MED ORDER — ACETAMINOPHEN 325 MG PO TABS
650.0000 mg | ORAL_TABLET | ORAL | Status: DC | PRN
  Administered 2020-12-23 (×2): 650 mg via ORAL
  Filled 2020-12-22 (×2): qty 2

## 2020-12-22 MED ORDER — LABETALOL HCL 5 MG/ML IV SOLN: 80.0000 mg | INTRAVENOUS | Status: DC | PRN

## 2020-12-22 MED ORDER — LACTATED RINGERS IV SOLN
500.0000 mL | INTRAVENOUS | Status: DC | PRN
  Administered 2020-12-23: 14:00:00 500 mL via INTRAVENOUS

## 2020-12-22 MED ORDER — MAGNESIUM SULFATE BOLUS VIA INFUSION
4.0000 g | Freq: Once | INTRAVENOUS | Status: AC
  Administered 2020-12-22: 21:00:00 4 g via INTRAVENOUS
  Filled 2020-12-22: qty 1000

## 2020-12-22 MED ORDER — ONDANSETRON HCL 4 MG/2ML IJ SOLN
4.0000 mg | Freq: Four times a day (QID) | INTRAMUSCULAR | Status: DC | PRN
  Administered 2020-12-23: 18:00:00 4 mg via INTRAVENOUS
  Filled 2020-12-22: qty 2

## 2020-12-22 MED ORDER — MISOPROSTOL 25 MCG QUARTER TABLET
25.0000 ug | ORAL_TABLET | ORAL | Status: DC | PRN
  Administered 2020-12-22 – 2020-12-23 (×2): 25 ug via VAGINAL
  Filled 2020-12-22 (×3): qty 1

## 2020-12-22 MED ORDER — OXYTOCIN 10 UNIT/ML IJ SOLN
INTRAMUSCULAR | Status: AC
  Filled 2020-12-22: qty 2

## 2020-12-22 MED ORDER — SOD CITRATE-CITRIC ACID 500-334 MG/5ML PO SOLN: 30.0000 mL | ORAL | Status: DC | PRN

## 2020-12-22 MED ORDER — BUTORPHANOL TARTRATE 1 MG/ML IJ SOLN: 1.0000 mg | INTRAMUSCULAR | Status: DC | PRN

## 2020-12-22 MED ORDER — OXYTOCIN-SODIUM CHLORIDE 30-0.9 UT/500ML-% IV SOLN
2.5000 [IU]/h | INTRAVENOUS | Status: DC
  Administered 2020-12-24: 01:00:00 2.5 [IU]/h via INTRAVENOUS
  Filled 2020-12-22: qty 500

## 2020-12-22 MED ORDER — LIDOCAINE HCL (PF) 1 % IJ SOLN: 30.0000 mL | INTRAMUSCULAR | Status: DC | PRN

## 2020-12-22 MED ORDER — LIDOCAINE HCL (PF) 1 % IJ SOLN
INTRAMUSCULAR | Status: AC
  Filled 2020-12-22: qty 30

## 2020-12-22 MED ORDER — HYDRALAZINE HCL 20 MG/ML IJ SOLN: 10.0000 mg | INTRAMUSCULAR | Status: DC | PRN

## 2020-12-22 MED ORDER — LABETALOL HCL 5 MG/ML IV SOLN
20.0000 mg | INTRAVENOUS | Status: DC | PRN
  Filled 2020-12-22: qty 4

## 2020-12-22 MED ORDER — TERBUTALINE SULFATE 1 MG/ML IJ SOLN: 0.2500 mg | Freq: Once | INTRAMUSCULAR | Status: DC | PRN

## 2020-12-22 MED ORDER — MAGNESIUM SULFATE 40 GM/1000ML IV SOLN
2.0000 g/h | INTRAVENOUS | Status: DC
  Administered 2020-12-22 – 2020-12-24 (×3): 2 g/h via INTRAVENOUS
  Filled 2020-12-22 (×3): qty 1000

## 2020-12-22 NOTE — Consult Note (Signed)
MFM Consultation  Samantha Glass is a 30 yo G1P0 at 49 w 1 d who with an EDD of 01/25/21. She has dichorionic diamniotic twin pregnancy and is hospitalized for superimposed preeclampsia.   Her prenatal care has been uneventful she had a low risk NIPS, she was taking low dose ASA.  She is being seen today at the request of Humberto Leep, MD.  Samantha Glass was admitted on 12/17/20 due to a new onset headache and elevated blood pressure. She was originally treated for chronic hypertension with Norvasc and Labetalol in August and October respectively.  While in the hospital she reports an oscillating headache it resolved the first day without therapy but has returned daily and today it presents again. Her blood pressure ranged from 140-150's/ 90-100's. Her Labetalol was recently increased as her providers were concerned about her blood pressures increasing.   She denies RUQ pain or N/V but notes that she is feeling more poorly.  Her labs are stable with a 24 hr urine 700+ and recent Plt 140's likely dilution as they were 150's 4 days ago. She received BMZ   Vitals with BMI 12/22/2020 12/22/2020 12/22/2020  Height - 5\' 11"  -  Weight - 297 lbs -  BMI - [id number] -  Systolic [id number] A999333  Diastolic 123456 A999333 99  Pulse - 86 82   CMP Latest Ref Rng & Units 12/20/2020 12/18/2020 12/17/2020  Glucose 70 - 99 mg/dL 84 91 -  BUN 6 - 20 mg/dL 11 11 -  Creatinine 0.44 - 1.00 mg/dL 0.57 0.58 -  Sodium 135 - 145 mmol/L 133(L) 129(L) 128(L)  Potassium 3.5 - 5.1 mmol/L 4.3 3.9 5.1  Chloride 98 - 111 mmol/L 106 104 104  CO2 22 - 32 mmol/L 21(L) 17(L) 19(L)  Calcium 8.9 - 10.3 mg/dL 8.6(L) 8.6(L) -  Total Protein 6.5 - 8.1 g/dL 5.6(L) 5.9(L) -  Total Bilirubin 0.3 - 1.2 mg/dL 0.6 0.4 -  Alkaline Phos 38 - 126 U/L 105 102 -  AST 15 - 41 U/L 20 18 -  ALT 0 - 44 U/L 14 16 -   CBC Latest Ref Rng & Units 12/20/2020 12/18/2020 12/17/2020  WBC 4.0 - 10.5 K/uL 14.2(H) 13.4(H) 11.0(H)  Hemoglobin 12.0 - 15.0 g/dL  11.0(L) 11.2(L) 11.8(L)  Hematocrit 36.0 - 46.0 % 32.0(L) 32.0(L) 34.1(L)  Platelets 150 - 400 K/uL 146(L) 147(L) 156   OB History  Gravida Para Term Preterm AB Living  1 0 0 0 0 0  SAB IAB Ectopic Multiple Live Births  0 0 0 0 0    # Outcome Date GA Lbr Len/2nd Weight Sex Delivery Anes PTL Lv  1 Current            Family History  Problem Relation Age of Onset   Hypertension Mother    Depression Mother    Hypertension Maternal Grandmother    Arthritis Maternal Grandmother    Diabetes Paternal Grandmother    Hypertension Paternal Grandmother    Hyperlipidemia Paternal Grandmother    Hypertension Paternal Grandfather     Current Facility-Administered Medications (Endocrine & Metabolic):    oxytocin (PITOCIN) IV BOLUS FROM BAG*   oxytocin (PITOCIN) IV infusion 30 units in NS 500 mL - Premix   Current Facility-Administered Medications (Cardiovascular):    amLODipine (NORVASC) tablet 5 mg   labetalol (NORMODYNE) injection 20 mg **AND** labetalol (NORMODYNE) injection 40 mg **AND** labetalol (NORMODYNE) injection 80 mg **AND** hydrALAZINE (APRESOLINE) injection 10 mg **AND** Measure blood pressure  labetalol (NORMODYNE) tablet 300 mg     Current Facility-Administered Medications (Analgesics):    acetaminophen (TYLENOL) tablet 650 mg   aspirin EC tablet 81 mg   butorphanol (STADOL) injection 1 mg   lidocaine (PF) (XYLOCAINE) 1 % injection 30 mL   Current Facility-Administered Medications (Hematological):    ferrous sulfate tablet 325 mg **AND** ascorbic acid (VITAMIN C) tablet 250 mg   Current Facility-Administered Medications (Other):    0.9 % NaCl with KCl 20 mEq/ L  infusion   ferrous sulfate tablet 325 mg **AND** ascorbic acid (VITAMIN C) tablet 250 mg   buPROPion (WELLBUTRIN XL) 24 hr tablet 300 mg   lactated ringers infusion 500-1,000 mL   ondansetron (ZOFRAN) injection 4 mg   oxytocin (PITOCIN) IV BOLUS FROM BAG*   prenatal multivitamin tablet 1 tablet    sodium citrate-citric acid (ORACIT) solution 30 mL  * These medications belong to multiple therapeutic classes and are listed under each applicable group. No current outpatient medications on file. Social History   Socioeconomic History   Marital status: Married    Spouse name: Not on file   Number of children: Not on file   Years of education: Not on file   Highest education level: Not on file  Occupational History   Not on file  Tobacco Use   Smoking status: Never   Smokeless tobacco: Never  Vaping Use   Vaping Use: Never used  Substance and Sexual Activity   Alcohol use: Not Currently    Comment: occassional   Drug use: No   Sexual activity: Yes    Birth control/protection: None    Comment: undecided  Other Topics Concern   Not on file  Social History Narrative   Not on file   Social Determinants of Health   Financial Resource Strain: Not on file  Food Insecurity: Not on file  Transportation Needs: Not on file  Physical Activity: Not on file  Stress: Not on file  Social Connections: Not on file  Intimate Partner Violence: Not on file   No Known Allergies   Imaging: Today we observed a diamniotic dichorionic twin pregnancy with 9% discordance. The BPP was 8/8 for both and cephalic cephalic presentation. Twin A 2411 g and Twin B 2661 g.  Please see ultrasound report for details   Impression/Counseling:   I reviewed with Samantha Glass and her significant other today normal ultrasound examination. I also reviewed the diagnosis, evaluation and management of preeclampsia with an without severe features.  Given Samantha Glass's persistent oscillating headache, worsening feeling and increase need for blood pressure therapy.   I discussed that as preeclampsia progresses to severe features there is an increased risk for placental abruption, seizure, stillbirth and maternal stroke. At >34 weeks ACOG recommends delivery.  I suggest that she should move toward delivery  with the diagnosis of preeclampsia with severe features. I recommend magnesium sulfate until 24 hour postpartum. Maintain blood pressure < 145/100 mmHg.   Samantha Glass is s/p NICU consult.  All questions answered.  I spent 45 minutes with > 50% in face to face consultation.  Novella Olive, MD.   I discussed this plan of care with Dr. Bonney Aid who was in agreement.

## 2020-12-22 NOTE — Progress Notes (Signed)
Obstetric and Gynecology  Subjective  In to discuss results of growth scan with patient as well as MFM recommendations  Objective  Vital signs in last 24 hours: Temp:  [97.6 F (36.4 C)-98.4 F (36.9 C)] 97.9 F (36.6 C) (12/22 1356) Pulse Rate:  [74-86] 86 (12/22 1356) Resp:  [18-20] 18 (12/22 1259) BP: (131-151)/(74-106) 151/104 (12/22 1400) SpO2:  [98 %-100 %] 99 % (12/22 1356) Weight:  [134.7 kg-135.3 kg] 134.7 kg (12/22 1356) Last BM Date: 12/18/20  No intake or output data in the 24 hours ending 12/22/20 1619  General: NAD Pulmonary: no increased work of breathing Abdomen: gravid, non-tender Extremities: no edema  Labs: No results found for this or any previous visit (from the past 24 hour(s)).  Cultures: Results for orders placed or performed during the hospital encounter of 12/17/20  Group B strep by PCR     Status: None   Collection Time: 12/17/20  6:32 AM   Specimen: Vaginal/Rectal; Genital  Result Value Ref Range Status   Group B strep by PCR NEGATIVE NEGATIVE Final    Comment: (NOTE) Intrapartum testing with Xpert GBS assay should be used as an adjunct to other methods available and not used to replace antepartum testing (at 35-[redacted] weeks gestation). Performed at [facility], [address] [place], [place] [address]   Resp Panel by RT-PCR (Flu A&B, Covid) Nasopharyngeal Swab     Status: None   Collection Time: 12/17/20  9:59 AM   Specimen: Nasopharyngeal Swab; Nasopharyngeal(NP) swabs in vial transport medium  Result Value Ref Range Status   SARS Coronavirus 2 by RT PCR NEGATIVE NEGATIVE Final    Comment: (NOTE) SARS-CoV-2 target nucleic acids are NOT DETECTED.  The SARS-CoV-2 RNA is generally detectable in upper respiratory specimens during the acute phase of infection. The lowest concentration of SARS-CoV-2 viral copies this assay can detect is 138 copies/mL. A negative result does not preclude SARS-Cov-2 infection and should not be used  as the sole basis for treatment or other patient management decisions. A negative result may occur with  improper specimen collection/handling, submission of specimen other than nasopharyngeal swab, presence of viral mutation(s) within the areas targeted by this assay, and inadequate number of viral copies(<138 copies/mL). A negative result must be combined with clinical observations, patient history, and epidemiological information. The expected result is Negative.  Fact Sheet for Patients:  BloggerCourse.com  Fact Sheet for Healthcare Providers:  SeriousBroker.it  This test is no t yet approved or cleared by the [place] FDA and  has been authorized for detection and/or diagnosis of SARS-CoV-2 by FDA under an Emergency Use Authorization (EUA). This EUA will remain  in effect (meaning this test can be used) for the duration of the COVID-19 declaration under Section 564(b)(1) of the Act, 21 U.S.C.section 360bbb-3(b)(1), unless the authorization is terminated  or revoked sooner.       Influenza A by PCR NEGATIVE NEGATIVE Final   Influenza B by PCR NEGATIVE NEGATIVE Final    Comment: (NOTE) The Xpert Xpress SARS-CoV-2/FLU/RSV plus assay is intended as an aid in the diagnosis of influenza from Nasopharyngeal swab specimens and should not be used as a sole basis for treatment. Nasal washings and aspirates are unacceptable for Xpert Xpress SARS-CoV-2/FLU/RSV testing.  Fact Sheet for Patients: BloggerCourse.com  Fact Sheet for Healthcare Providers: SeriousBroker.it  This test is not yet approved or cleared by the [place] FDA and has been authorized for detection and/or diagnosis of SARS-CoV-2 by FDA under an Emergency  Use Authorization (EUA). This EUA will remain in effect (meaning this test can be used) for the duration of the COVID-19 declaration under Section 564(b)(1)  of the Act, 21 U.S.C. section 360bbb-3(b)(1), unless the authorization is terminated or revoked.  Performed at [facility], [address] [place], [place] [address]     Imaging: [place] [employer]  Result Date: 12/20/2020 CLINICAL DATA:  Preeclampsia EXAM: LIMITED OBSTETRIC ULTRASOUND FINDINGS: Number of Fetuses:  2 Separating Membrane: TWIN 1 Heart Rate:  138 bpm Movement: Present Presentation: Cephalic on the maternal left side Placental Location: Anterior Previa: Absent Amniotic Fluid (Subjective):  Within normal limits.  AFI: 4.2 cm Femur length: 6.89cm 35w 3d TWIN 2 Heart Rate:  147 bpm Movement: Present Presentation: Breech on the maternal right side Placental Location: Anterior Previa: Absent Amniotic Fluid (Subjective): Normal AFI: 4.1 cm BPD:  8.73cm 35w 2d MATERNAL FINDINGS: Cervix:  Appears closed. Uterus/Adnexae: No abnormality visualized. IMPRESSION: Twin fetuses as described above dated at 35 weeks 2 days. No acute abnormality noted. This exam is performed on an emergent basis and does not comprehensively evaluate fetal size, dating, or anatomy; follow-up complete OB US should be considered if further fetal assessment is warranted Electronically Signed   By: Alcide Clever M.D.   On: 12/20/2020 15:20   US OB Limited  Result Date: 12/20/2020 CLINICAL DATA:  Preeclampsia EXAM: LIMITED OBSTETRIC ULTRASOUND FINDINGS: Number of Fetuses:  2 Separating Membrane: TWIN 1 Heart Rate:  138 bpm Movement: Present Presentation: Cephalic on the maternal left side Placental Location: Anterior Previa: Absent Amniotic Fluid (Subjective):  Within normal limits.  AFI: 4.2 cm Femur length: 6.89cm 35w 3d TWIN 2 Heart Rate:  147 bpm Movement: Present Presentation: Breech on the maternal right side Placental Location: Anterior Previa: Absent Amniotic Fluid (Subjective): Normal AFI: 4.1 cm BPD:  8.73cm 35w 2d MATERNAL FINDINGS: Cervix:  Appears closed. Uterus/Adnexae: No abnormality visualized.  IMPRESSION: Twin fetuses as described above dated at 35 weeks 2 days. No acute abnormality noted. This exam is performed on an emergent basis and does not comprehensively evaluate fetal size, dating, or anatomy; follow-up complete OB US should be considered if further fetal assessment is warranted Electronically Signed   By: Alcide Clever M.D.   On: 12/20/2020 15:20   [place] MFM FETAL BPP WO NON STRESS  Result Date: 12/22/2020 ----------------------------------------------------------------------  OBSTETRICS REPORT                       (Signed Final 12/22/2020 04:00 pm) ---------------------------------------------------------------------- Patient Info  ID #:       [id number]                          D.O.B.:  [birth year] (30 yrs)  Name:       Samantha Glass               Visit Date: 12/22/2020 02:14 pm ---------------------------------------------------------------------- Performed By  Attending:        Lin Landsman      Ref. Address:     [address] [place],                                                             [place] [address]  Performed By:     Birdena Crandall        [facility]                                 [facility] at                                                             [facility]  Referred By:      Natale Milch ---------------------------------------------------------------------- Orders  #  Description                           Code        Ordered By  1  [place] MFM FETAL BPP WO NON               76819.01    RAVI SHANKAR     STRESS  2  [place] MFM FETAL BPP WO NST               21308.6     RAVI SHANKAR     ADDL GESTATION  3  [place] MFM OB FOLLOW UP                   76816.01    CORENTHIAN                                                       BOOKER  4  [place] MFM OB FOLLOW UP ADDL              57846.96    Jettie Pagan ----------------------------------------------------------------------  #  Order #                     Accession #                Episode #  1  [id number]                   [id number]  [id number]  2  [id number]                   [id number]  3  [id number]                   [id number]  4  [id number]                   [id number]                 [id number] ---------------------------------------------------------------------- Indications  Hypertension - Chronic/Pre-existing            O10.019  (labetalol)  Twin pregnancy, di/di, third trimester         O30.043  A1c 5.1/5.4  Obesity complicating pregnancy, third          O99.213  trimester  LR NIPS  [redacted] weeks gestation of pregnancy                Z3A.35 ---------------------------------------------------------------------- Fetal Evaluation (Fetus A)  Num Of Fetuses:         2  Fetal Heart Rate(bpm):  164  Cardiac Activity:       Observed  Fetal Lie:              Maternal left side  Presentation:           Cephalic  Placenta:               Anterior  Membrane Desc:      Dividing Membrane seen  Amniotic Fluid  AFI FV:      Within normal limits                              Largest Pocket(cm)                              2.9 ---------------------------------------------------------------------- Biophysical Evaluation (Fetus A)  Amniotic F.V:   Within normal limits       F. Tone:        Observed  F. Movement:    Observed                   Score:          8/8  F. Breathing:   Observed ---------------------------------------------------------------------- Biometry (Fetus A)  BPD:        81  mm     G. Age:  32w 4d        2.6  %    CI:        69.29   %    70 - 86                                                          FL/HC:      22.2   %    20.1 - 22.3  HC:      310.7  mm     G. Age:  34w 5d         11  %  HC/AC:      1.03        0.93 - 1.11  AC:      300.7  mm     G. Age:   34w 0d         25  %    FL/BPD:     85.3   %    71 - 87  FL:       69.1  mm     G. Age:  35w 3d         52  %    FL/AC:      23.0   %    20 - 24  Est. FW:    2411  gm      5 lb 5 oz     26  %     FW Discordancy         9  % ---------------------------------------------------------------------- OB History  Gravidity:    1         Term:   0        Prem:   0        SAB:   0  TOP:          0       Ectopic:  0        Living: 0 ---------------------------------------------------------------------- Gestational Age (Fetus A)  LMP:           35w 1d        Date:  04/20/20                 EDD:   01/25/21  U/S Today:     34w 1d                                        EDD:   02/01/21  Best:          35w 1d     Det. By:  LMP  (04/20/20)          EDD:   01/25/21 ---------------------------------------------------------------------- Anatomy (Fetus A)  Cranium:               Appears normal         Aortic Arch:            Previously seen  Cavum:                 Previously seen        Ductal Arch:            Not well visualized  Ventricles:            Previously seen        Diaphragm:              Previously seen  Choroid Plexus:        Previously seen        Stomach:                Appears normal, left  sided  Cerebellum:            Previously seen        Abdomen:                Previously seen  Posterior Fossa:       Previously seen        Abdominal Wall:         Previously seen  Nuchal Fold:           Not applicable (>20    Cord Vessels:           Previously seen                         wks GA)  Face:                  Orbits previously      Kidneys:                Previously seen                         seen  Lips:                  Previously seen        Bladder:                Appears normal  Thoracic:              Previously seen        Spine:                  Previously seen  Heart:                 Appears normal         Upper Extremities:      Previously seen                          (4CH, axis, and                         situs)  RVOT:                  Previously seen        Lower Extremities:      Previously seen  LVOT:                  Previously seen  Other:  Technically difficult due to twin pregnancy, maternal habitus and fetal          position. ---------------------------------------------------------------------- Fetal Evaluation (Fetus B)  Num Of Fetuses:         2  Fetal Heart Rate(bpm):  130  Cardiac Activity:       Observed  Fetal Lie:              Maternal right side  Presentation:           Cephalic  Placenta:               Anterior  Membrane Desc:      Dividing Membrane seen  Amniotic Fluid  AFI FV:      Within normal limits                              Largest Pocket(cm)  6. ---------------------------------------------------------------------- Biophysical Evaluation (Fetus B)  Amniotic F.V:   Within normal limits       F. Tone:        Observed  F. Movement:    Observed                   Score:          8/8  F. Breathing:   Observed ---------------------------------------------------------------------- Biometry (Fetus B)  BPD:      89.6  mm     G. Age:  36w 2d         82  %    CI:        79.87   %    70 - 86                                                          FL/HC:      20.3   %    20.1 - 22.3  HC:      316.8  mm     G. Age:  35w 4d         27  %    HC/AC:      0.98        0.93 - 1.11  AC:      322.6  mm     G. Age:  36w 1d         83  %    FL/BPD:     71.9   %    71 - 87  FL:       64.4  mm     G. Age:  33w 2d          6  %    FL/AC:      20.0   %    20 - 24  Est. FW:    2661  gm    5 lb 14 oz      54  %     FW Discordancy      0 \ 9 % ---------------------------------------------------------------------- Gestational Age (Fetus B)  LMP:           35w 1d        Date:  04/20/20                 EDD:   01/25/21  U/S Today:     35w 2d                                        EDD:   01/24/21  Best:          35w 1d     Det. By:   LMP  (04/20/20)          EDD:   01/25/21 ---------------------------------------------------------------------- Anatomy (Fetus B)  Cranium:               Appears normal         Aortic Arch:            Previously seen  Cavum:                 Previously seen        Ductal Arch:  Previously seen  Ventricles:            Appears normal         Diaphragm:              Previously seen  Choroid Plexus:        Previously seen        Stomach:                Appears normal, left                                                                        sided  Cerebellum:            Previously seen        Abdomen:                Previously seen  Posterior Fossa:       Previously seen        Abdominal Wall:         Previously seen  Nuchal Fold:           Previously seen        Cord Vessels:           Previously seen  Face:                  Orbits and profile     Kidneys:                Previously seen                         previously seen  Lips:                  Not well visualized    Bladder:                Appears normal  Thoracic:              Previously seen        Spine:                  Previously seen  Heart:                 Previously seen        Upper Extremities:      Previously seen  RVOT:                  Not well visualized    Lower Extremities:      Previously seen  LVOT:                  Not well visualized  Other:  Technically difficult due to twin pregnancy, maternal habitus and fetal          position. ---------------------------------------------------------------------- Impression  Follow up growth for diamiotic dichorionic twin pregnancy.  Positive interval growth with Twin discordance of 9%  Biophysical profile 8/8  Ms. Conde is seen today for consultation for inpatient  management of preeclampsia with severe features.  Please see consult for details.  I discussed the plan of care with Ms. Bruening and Dr. Bonney Aid. ----------------------------------------------------------------------  Lin Landsman, MD Electronically Signed Final Report   12/22/2020 04:00 pm ----------------------------------------------------------------------  [place] MFM FETAL BPP WO NON STRESS  Result Date: 12/14/2020 ----------------------------------------------------------------------  OBSTETRICS REPORT                       (Signed Final 12/14/2020 01:58 pm) ---------------------------------------------------------------------- Patient Info  ID #:       [id number]                          D.O.B.:  [birth year] (30 yrs)  Name:       Samantha Glass               Visit Date: 12/14/2020 10:11 am ---------------------------------------------------------------------- Performed By  Attending:        Ma Rings MD         Ref. Address:     [address] [place],                                                             [place] [address]  Performed By:     Eden Lathe BS      [facility]                    RDMS RVT                                 [facility] at                                                             [facility]  Referred By:      Natale Milch ---------------------------------------------------------------------- Orders  #  Description                           Code        Ordered By  1  [place] MFM FETAL BPP WO NON               62130.86    RAVI SHANKAR     STRESS  2  [place]  MFM FETAL BPP WO NST               76819.1     [facility]     ADDL GESTATION ----------------------------------------------------------------------  #  Order #                     Accession #                Episode #  1  [id number]                   [id number]                 [id number]  2  [id number]                   [id number]                 [id number] ---------------------------------------------------------------------- Indications  Twin pregnancy, di/di,  third trimester         O30.043  Hypertension - Chronic/Pre-existing            O10.019  (labetalol)  [redacted] weeks gestation of pregnancy                Z3A.34  A1c 5.1/5.4  LR NIPS ---------------------------------------------------------------------- Fetal Evaluation (Fetus A)  Num Of Fetuses:         2  Fetal Heart Rate(bpm):  157  Cardiac Activity:       Observed  Fetal Lie:              Maternal left side  Presentation:           Cephalic  Placenta:               Anterior  P. Cord Insertion:      Previously Visualized  Membrane Desc:      Dividing Membrane seen - Dichorionic.  Amniotic Fluid  AFI FV:      Within normal limits                              Largest Pocket(cm)                              3.4 ---------------------------------------------------------------------- Biophysical Evaluation (Fetus A)  Amniotic F.V:   Pocket => 2 cm             F. Tone:        Observed  F. Movement:    Observed                   Score:          8/8  F. Breathing:   Observed ---------------------------------------------------------------------- OB History  Gravidity:    1         Term:   0        Prem:   0        SAB:   0  TOP:          0       Ectopic:  0        Living: 0 ---------------------------------------------------------------------- Gestational Age (Fetus A)  LMP:           34w 0d        Date:  04/20/20                 EDD:  01/25/21  Best:          34w 0d     Det. By:  LMP  (04/20/20)          EDD:   01/25/21 ---------------------------------------------------------------------- Fetal Evaluation (Fetus B)  Num Of Fetuses:         2  Fetal Heart Rate(bpm):  140  Cardiac Activity:       Observed  Fetal Lie:              Maternal right side  Presentation:           Cephalic  Placenta:               Anterior  P. Cord Insertion:      Previously Visualized  Membrane Desc:      Dividing Membrane seen - Dichorionic.  Amniotic Fluid  AFI FV:      Within normal limits                              Largest Pocket(cm)                               4.2 ---------------------------------------------------------------------- Biophysical Evaluation (Fetus B)  Amniotic F.V:   Within normal limits       F. Tone:        Observed  F. Movement:    Observed                   Score:          8/8  F. Breathing:   Observed ---------------------------------------------------------------------- Gestational Age (Fetus B)  LMP:           34w 0d        Date:  04/20/20                 EDD:   01/25/21  Best:          34w 0d     Det. By:  LMP  (04/20/20)          EDD:   01/25/21 ---------------------------------------------------------------------- Cervix Uterus Adnexa  Cervix  Not visualized (advanced GA >24wks)  Uterus  No abnormality visualized.  Cul De Sac  No free fluid seen.  Adnexa  No abnormality visualized. ---------------------------------------------------------------------- Comments  This patient was seen for a biophysical profile due to a  dichorionic, diamniotic twin gestation with chronic  hypertension that is treated with both labetalol and Norvasc.  Her blood pressures in our office today were 140/93 and  134/93.  She denies any signs or symptoms of preeclampsia.  A biophysical profile performed today was 8 out of 8 twin A  and twin B.  There was normal amniotic fluid noted on today's ultrasound  exam around both fetuses.  Due to chronic hypertension and a dichorionic twin  pregnancy, delivery may be considered at between 36 to 37  weeks.  Preeclampsia precautions were reviewed today.  She will return in 1 week for another BPP. ----------------------------------------------------------------------                   Ma Rings, MD Electronically Signed Final Report   12/14/2020 01:58 pm ----------------------------------------------------------------------  [place] MFM FETAL BPP WO NON STRESS  Result Date: 12/08/2020 ----------------------------------------------------------------------  OBSTETRICS REPORT                       (  Signed Final  12/08/2020 08:37 am) ---------------------------------------------------------------------- Patient Info  ID #:       [id number]                          D.O.B.:  09-05-1990 (30 yrs)  Name:       Samantha Glass               Visit Date: 12/08/2020 08:20 am ---------------------------------------------------------------------- Performed By  Attending:        Lin Landsman      Ref. Address:     [address]                    [place]                                                             [place], [place],                                                             [place] [address]  Performed By:     Marcellina Millin          [facility]                    RDMS                                     [facility]  Referred By:      Natale Milch ---------------------------------------------------------------------- Orders  #  Description                           Code        Ordered By  1  [place] MFM FETAL BPP WO NON               76819.01    RAVI SHANKAR     STRESS  2  [place] MFM FETAL BPP WO NST               76819.1     RAVI [facility]  ADDL GESTATION ----------------------------------------------------------------------  #  Order #                     Accession #                Episode #  1  [id number]                   [id number]                 [id number]  2  [id number]                   [id number]                 [id number] ---------------------------------------------------------------------- Indications  Hypertension - Chronic/Pre-existing            O10.019  (labetalol)  [redacted] weeks gestation of pregnancy                Z3A.33  Twin pregnancy, di/di, third trimester         O30.043  A1c 5.1/5.4  LR NIPS ---------------------------------------------------------------------- Fetal Evaluation (Fetus A)  Num Of Fetuses:         2  Fetal Heart  Rate(bpm):  152  Cardiac Activity:       Observed  Fetal Lie:              Maternal left side  Presentation:           Cephalic  Placenta:               Anterior  P. Cord Insertion:      Previously Visualized  Membrane Desc:      Dividing Membrane seen - Dichorionic.  Amniotic Fluid  AFI FV:      Within normal limits                              Largest Pocket(cm)                              3.1 ---------------------------------------------------------------------- Biophysical Evaluation (Fetus A)  Amniotic F.V:   Within normal limits       F. Tone:        Observed  F. Movement:    Observed                   Score:          8/8  F. Breathing:   Observed ---------------------------------------------------------------------- Gestational Age (Fetus A)  LMP:           32w 5d        Date:  04/23/20                 EDD:   01/28/21  Clinical EDD:  33w 1d                                        EDD:   01/25/21  Best:          33w 1d     Det. By:  Clinical EDD             EDD:   01/25/21 ---------------------------------------------------------------------- Anatomy (Fetus A)  Stomach:  Appears normal, left   Bladder:                Appears normal                         sided ---------------------------------------------------------------------- Fetal Evaluation (Fetus B)  Num Of Fetuses:         2  Fetal Heart Rate(bpm):  138  Cardiac Activity:       Observed  Fetal Lie:              Maternal right side  Presentation:           Cephalic  Placenta:               Anterior  P. Cord Insertion:      Previously Visualized  Membrane Desc:      Dividing Membrane seen - Dichorionic.  Amniotic Fluid  AFI FV:      Within normal limits                              Largest Pocket(cm)                              3.7 ---------------------------------------------------------------------- Biophysical Evaluation (Fetus B)  Amniotic F.V:   Within normal limits       F. Tone:        Observed  F. Movement:    Observed                    Score:          8/8  F. Breathing:   Observed ---------------------------------------------------------------------- Gestational Age (Fetus B)  LMP:           32w 5d        Date:  04/23/20                 EDD:   01/28/21  Clinical EDD:  33w 1d                                        EDD:   01/25/21  Best:          33w 1d     Det. By:  Clinical EDD             EDD:   01/25/21 ---------------------------------------------------------------------- Anatomy (Fetus B)  Stomach:               Appears normal, left   Bladder:                Appears normal                         sided ---------------------------------------------------------------------- Impression  Antenatal testing performed given maternal twin pregnancy  with known chronic hypertension.  Prior growth with 3% discordance, with growth appropriate for  gestational age on 11/28  Twin A normal stomach, amniotic fluid, bladder and BPP 8/8-  Maternal left, Cephalic  Twin B normal stomach, amniotic fluid, bladder and BPP 8/8-  Maternal right, Cephalic  Blood pressure is stable 136/96 with repeat of 142/94 mmHg  she denies s/sx of preeclampsia ---------------------------------------------------------------------- Recommendations  Continue weekly testing  Repeat growth in 2 weeks. ----------------------------------------------------------------------  Lin Landsman, MD Electronically Signed Final Report   12/08/2020 08:37 am ----------------------------------------------------------------------  [place] MFM OB FOLLOW UP  Result Date: 12/22/2020 ----------------------------------------------------------------------  OBSTETRICS REPORT                       (Signed Final 12/22/2020 04:00 pm) ---------------------------------------------------------------------- Patient Info  ID #:       [id number]                          D.O.B.:  1990-09-26 (30 yrs)  Name:       Samantha Glass               Visit Date: 12/22/2020 02:14 pm  ---------------------------------------------------------------------- Performed By  Attending:        Lin Landsman      Ref. Address:     [address]                    [place]                                                             [place], [place],                                                             [place] [address]  Performed By:     Birdena Crandall        [facility]                    [facility]                                 F[facility] Care at                                                             [facility]  Referred By:      Natale Milch ---------------------------------------------------------------------- Orders  #  Description                           Code        Ordered By  1  [place] MFM FETAL BPP WO NON               76819.01    RAVI SHANKAR     STRESS  2  [place] MFM FETAL BPP WO NST               62130.8     RAVI SHANKAR     ADDL GESTATION  3  [place] MFM OB FOLLOW UP  40981.19    CORENTHIAN                                                       BOOKER  4  [place] MFM OB FOLLOW UP ADDL              14782.95    Jettie Pagan ----------------------------------------------------------------------  #  Order #                     Accession #                Episode #  1  [id number]                   [id number]                 [id number]  2  [id number]                   [id number]                 [id number]  3  [id number]                   [id number]                 [id number]  4  [id number]                   [id number]                 [id number] ---------------------------------------------------------------------- Indications  Hypertension - Chronic/Pre-existing            O10.019  (labetalol)  Twin pregnancy, di/di, third trimester         O30.043  A1c 5.1/5.4  Obesity complicating pregnancy, third          O99.213  trimester  LR NIPS  [redacted] weeks gestation of pregnancy                Z3A.35  ---------------------------------------------------------------------- Fetal Evaluation (Fetus A)  Num Of Fetuses:         2  Fetal Heart Rate(bpm):  164  Cardiac Activity:       Observed  Fetal Lie:              Maternal left side  Presentation:           Cephalic  Placenta:               Anterior  Membrane Desc:      Dividing Membrane seen  Amniotic Fluid  AFI FV:      Within normal limits                              Largest Pocket(cm)                              2.9 ---------------------------------------------------------------------- Biophysical Evaluation (Fetus A)  Amniotic  F.V:   Within normal limits       F. Tone:        Observed  F. Movement:    Observed                   Score:          8/8  F. Breathing:   Observed ---------------------------------------------------------------------- Biometry (Fetus A)  BPD:        81  mm     G. Age:  32w 4d        2.6  %    CI:        69.29   %    70 - 86                                                          FL/HC:      22.2   %    20.1 - 22.3  HC:      310.7  mm     G. Age:  34w 5d         11  %    HC/AC:      1.03        0.93 - 1.11  AC:      300.7  mm     G. Age:  34w 0d         25  %    FL/BPD:     85.3   %    71 - 87  FL:       69.1  mm     G. Age:  35w 3d         52  %    FL/AC:      23.0   %    20 - 24  Est. FW:    2411  gm      5 lb 5 oz     26  %     FW Discordancy         9  % ---------------------------------------------------------------------- OB History  Gravidity:    1         Term:   0        Prem:   0        SAB:   0  TOP:          0       Ectopic:  0        Living: 0 ---------------------------------------------------------------------- Gestational Age (Fetus A)  LMP:           35w 1d        Date:  04/20/20                 EDD:   01/25/21  U/S Today:     34w 1d                                        EDD:   02/01/21  Best:          35w 1d     Det. By:  LMP  (04/20/20)          EDD:   01/25/21  ---------------------------------------------------------------------- Anatomy (Fetus A)  Cranium:  Appears normal         Aortic Arch:            Previously seen  Cavum:                 Previously seen        Ductal Arch:            Not well visualized  Ventricles:            Previously seen        Diaphragm:              Previously seen  Choroid Plexus:        Previously seen        Stomach:                Appears normal, left                                                                        sided  Cerebellum:            Previously seen        Abdomen:                Previously seen  Posterior Fossa:       Previously seen        Abdominal Wall:         Previously seen  Nuchal Fold:           Not applicable (>20    Cord Vessels:           Previously seen                         wks GA)  Face:                  Orbits previously      Kidneys:                Previously seen                         seen  Lips:                  Previously seen        Bladder:                Appears normal  Thoracic:              Previously seen        Spine:                  Previously seen  Heart:                 Appears normal         Upper Extremities:      Previously seen                         (4CH, axis, and                         situs)  RVOT:  Previously seen        Lower Extremities:      Previously seen  LVOT:                  Previously seen  Other:  Technically difficult due to twin pregnancy, maternal habitus and fetal          position. ---------------------------------------------------------------------- Fetal Evaluation (Fetus B)  Num Of Fetuses:         2  Fetal Heart Rate(bpm):  130  Cardiac Activity:       Observed  Fetal Lie:              Maternal right side  Presentation:           Cephalic  Placenta:               Anterior  Membrane Desc:      Dividing Membrane seen  Amniotic Fluid  AFI FV:      Within normal limits                              Largest Pocket(cm)                               6. ---------------------------------------------------------------------- Biophysical Evaluation (Fetus B)  Amniotic F.V:   Within normal limits       F. Tone:        Observed  F. Movement:    Observed                   Score:          8/8  F. Breathing:   Observed ---------------------------------------------------------------------- Biometry (Fetus B)  BPD:      89.6  mm     G. Age:  36w 2d         82  %    CI:        79.87   %    70 - 86                                                          FL/HC:      20.3   %    20.1 - 22.3  HC:      316.8  mm     G. Age:  35w 4d         27  %    HC/AC:      0.98        0.93 - 1.11  AC:      322.6  mm     G. Age:  36w 1d         83  %    FL/BPD:     71.9   %    71 - 87  FL:       64.4  mm     G. Age:  33w 2d          6  %    FL/AC:      20.0   %    20 - 24  Est. FW:    2661  gm    5 lb 14 oz  54  %     FW Discordancy      0 \ 9 % ---------------------------------------------------------------------- Gestational Age (Fetus B)  LMP:           35w 1d        Date:  04/20/20                 EDD:   01/25/21  U/S Today:     35w 2d                                        EDD:   01/24/21  Best:          35w 1d     Det. By:  LMP  (04/20/20)          EDD:   01/25/21 ---------------------------------------------------------------------- Anatomy (Fetus B)  Cranium:               Appears normal         Aortic Arch:            Previously seen  Cavum:                 Previously seen        Ductal Arch:            Previously seen  Ventricles:            Appears normal         Diaphragm:              Previously seen  Choroid Plexus:        Previously seen        Stomach:                Appears normal, left                                                                        sided  Cerebellum:            Previously seen        Abdomen:                Previously seen  Posterior Fossa:       Previously seen        Abdominal Wall:         Previously seen  Nuchal Fold:            Previously seen        Cord Vessels:           Previously seen  Face:                  Orbits and profile     Kidneys:                Previously seen                         previously seen  Lips:                  Not well visualized    Bladder:  Appears normal  Thoracic:              Previously seen        Spine:                  Previously seen  Heart:                 Previously seen        Upper Extremities:      Previously seen  RVOT:                  Not well visualized    Lower Extremities:      Previously seen  LVOT:                  Not well visualized  Other:  Technically difficult due to twin pregnancy, maternal habitus and fetal          position. ---------------------------------------------------------------------- Impression  Follow up growth for diamiotic dichorionic twin pregnancy.  Positive interval growth with Twin discordance of 9%  Biophysical profile 8/8  Ms. Magwood is seen today for consultation for inpatient  management of preeclampsia with severe features.  Please see consult for details.  I discussed the plan of care with Ms. Hearn and Dr. Bonney Aid. ----------------------------------------------------------------------               Lin Landsman, MD Electronically Signed Final Report   12/22/2020 04:00 pm ----------------------------------------------------------------------  [place] MFM OB FOLLOW UP  Result Date: 11/28/2020 ----------------------------------------------------------------------  OBSTETRICS REPORT                       (Signed Final 11/28/2020 04:38 pm) ---------------------------------------------------------------------- Patient Info  ID #:       [id number]                          D.O.B.:  January 30, 1990 (30 yrs)  Name:       Samantha Glass               Visit Date: 11/28/2020 02:29 pm ---------------------------------------------------------------------- Performed By  Attending:        Noralee Space MD        Ref. Address:     [address], [place],                                                             [place] [address]  Performed By:     Charlyne Petrin     [facility]                    RD[facility]                                     [facility] at                                                             [facility]  Referred By:      Natale Milch ---------------------------------------------------------------------- Orders  #  Description                           Code        Ordered By  1  [place] MFM OB FOLLOW UP                   76816.01    RAVI SHANKAR  2  [place] MFM OB FOLLOW UP ADDL              [address].60    RAVI SHANKAR     GEST ----------------------------------------------------------------------  #  Order #                     Accession #                Episode #  1  [id number]                   [id number]                 [id number]  2  [id number]                   [id number]                 [id number] ---------------------------------------------------------------------- Indications  Hypertension - Chronic/Pre-existing            O10.019  (labetalol)  Twin pregnancy, di/di, third trimester         O30.043  A1c 5.1/5.4  LR NIPS  [redacted] weeks gestation of pregnancy                Z3A.31 ---------------------------------------------------------------------- Fetal Evaluation (Fetus A)  Num Of Fetuses:         2  Fetal Heart Rate(bpm):  140  Cardiac Activity:       Observed  Fetal Lie:              Maternal left side  Presentation:           Cephalic  Placenta:               Anterior  P. Cord Insertion:      Previously Visualized  Membrane Desc:      Dividing Membrane seen - Dichorionic.  Amniotic Fluid  AFI FV:      Within normal limits  Largest Pocket(cm)                              4.16 ----------------------------------------------------------------------  Biometry (Fetus A)  BPD:      77.3  mm     G. Age:  31w 0d         21  %    CI:         71.2   %    70 - 86                                                          FL/HC:      20.6   %    19.1 - 21.3  HC:      291.8  mm     G. Age:  32w 1d         25  %    HC/AC:      1.05        0.96 - 1.17  AC:      278.7  mm     G. Age:  31w 6d         54  %    FL/BPD:     77.9   %    71 - 87  FL:       60.2  mm     G. Age:  31w 2d         26  %    FL/AC:      21.6   %    20 - 24  HUM:      53.3  mm     G. Age:  31w 0d         37  %  Est. FW:    1817  gm           4 lb     37  %     FW Discordancy         3  % ---------------------------------------------------------------------- Gestational Age (Fetus A)  LMP:           31w 2d        Date:  04/23/20                 EDD:   01/28/21  Clinical EDD:  31w 5d                                        EDD:   01/25/21  U/S Today:     31w 4d                                        EDD:   01/26/21  Best:          31w 5d     Det. By:  Clinical EDD             EDD:   01/25/21 ---------------------------------------------------------------------- Anatomy (Fetus A)  Cranium:               Appears normal  Aortic Arch:            Previously seen  Cavum:                 Previously seen        Ductal Arch:            Not well visualized  Ventricles:            Previously seen        Diaphragm:              Previously seen  Choroid Plexus:        Previously seen        Stomach:                Appears normal, left                                                                        sided  Cerebellum:            Previously seen        Abdomen:                Previously seen  Posterior Fossa:       Previously seen        Abdominal Wall:         Previously seen  Nuchal Fold:           Not applicable (>20    Cord Vessels:           Previously seen                         wks GA)  Face:                  Orbits previously      Kidneys:                Appear normal                         seen   Lips:                  Previously seen        Bladder:                Appears normal  Thoracic:              Previously seen        Spine:                  Previously seen  Heart:                 Appears normal         Upper Extremities:      Previously seen                         (4CH, axis, and                         situs)  RVOT:  Previously seen        Lower Extremities:      Previously seen  LVOT:                  Previously seen  Other:  Technically difficult due to maternal habitus and fetal position.Open          hands previously visualized. ---------------------------------------------------------------------- Fetal Evaluation (Fetus B)  Num Of Fetuses:         2  Fetal Heart Rate(bpm):  150  Cardiac Activity:       Observed  Fetal Lie:              Maternal right side  Presentation:           Cephalic  Placenta:               Anterior  P. Cord Insertion:      Previously Visualized  Membrane Desc:      Dividing Membrane seen - Dichorionic.  Amniotic Fluid  AFI FV:      Within normal limits                              Largest Pocket(cm)                              5.32 ---------------------------------------------------------------------- Biometry (Fetus B)  BPD:      83.4  mm     G. Age:  33w 4d         89  %    CI:        75.66   %    70 - 86                                                          FL/HC:      18.7   %    19.1 - 21.3  HC:       304   mm     G. Age:  33w 6d         70  %    HC/AC:      1.07        0.96 - 1.17  AC:      284.9  mm     G. Age:  32w 4d         72  %    FL/BPD:     68.1   %    71 - 87  FL:       56.8  mm     G. Age:  29w 6d        3.8  %    FL/AC:      19.9   %    20 - 24  HUM:      50.9  mm     G. Age:  29w 6d         10  %  LV:        5.5  mm  Est. FW:    1865  gm      4 lb 2 oz     45  %     FW Discordancy  0 \ 3 % ---------------------------------------------------------------------- Gestational Age (Fetus B)  LMP:           31w 2d        Date:   04/23/20                 EDD:   01/28/21  Clinical EDD:  31w 5d                                        EDD:   01/25/21  U/S Today:     32w 3d                                        EDD:   01/20/21  Best:          31w 5d     Det. By:  Clinical EDD             EDD:   01/25/21 ---------------------------------------------------------------------- Anatomy (Fetus B)  Cranium:               Appears normal         Aortic Arch:            Previously seen  Cavum:                 Previously seen        Ductal Arch:            Previously seen  Ventricles:            Appears normal         Diaphragm:              Previously seen  Choroid Plexus:        Previously seen        Stomach:                Appears normal, left                                                                        sided  Cerebellum:            Previously seen        Abdomen:                Previously seen  Posterior Fossa:       Previously seen        Abdominal Wall:         Previously seen  Nuchal Fold:           Previously seen        Cord Vessels:           Previously seen  Face:                  Orbits and profile     Kidneys:                Appear normal  previously seen  Lips:                  Not well visualized    Bladder:                Appears normal  Thoracic:              Previously seen        Spine:                  Previously seen  Heart:                 Appears normal         Upper Extremities:      Previously seen                         (4CH, axis, and                         situs)  RVOT:                  Not well visualized    Lower Extremities:      Previously seen  LVOT:                  Not well visualized  Other:  Technically difficult due to maternal habitus and fetal position. ---------------------------------------------------------------------- Cervix Uterus Adnexa  Cervix  Not visualized (advanced GA >24wks)  Uterus  Normal shape and size.  Right Ovary  Not visualized.  Left Ovary  Not visualized.  Cul  De Sac  No free fluid seen. ---------------------------------------------------------------------- Impression  Dichorionic-diamniotic twin pregnancy.  Patient return for fetal  growth assessment.  Twin A: Maternal left, cephalic presentation, anterior  placenta.  Amniotic fluid is normal good fetal activity seen.  Fetal growth is appropriate for gestational age.  Twin B: Maternal right, cephalic presentation, anterior  placenta. Amniotic fluid is normal good fetal activity seen.  Fetal growth is appropriate for gestational age.  Growth discordancy: 3% (normal).  Blood pressure today at her office is 137/95 mmHg.  Patient  does not have symptoms and signs of severe features of  preeclampsia.  She takes labetalol for hypertension. ---------------------------------------------------------------------- Recommendations  -Continue weekly BPP till delivery. ----------------------------------------------------------------------                  Noralee Space, MD Electronically Signed Final Report   11/28/2020 04:38 pm ----------------------------------------------------------------------  [place] MFM OB FOLLOW UP ADDL GEST  Result Date: 12/22/2020 ----------------------------------------------------------------------  OBSTETRICS REPORT                       (Signed Final 12/22/2020 04:00 pm) ---------------------------------------------------------------------- Patient Info  ID #:       [id number]                          D.O.B.:  18-Sep-1990 (30 yrs)  Name:       Samantha Glass               Visit Date: 12/22/2020 02:14 pm ---------------------------------------------------------------------- Performed By  Attending:        Lin Landsman      Ref. Address:     [address], [place],                                                             [place] [address]  Performed By:     Birdena Crandall        Location[facility]         [facility]                     RD[facility]                                 Fetal Care at                                                             [facility]  Referred By:      Natale Milch ---------------------------------------------------------------------- Orders  #  Description                           Code        Ordered By  1  [place] MFM FETAL BPP WO NON               76819.01    RAVI SHANKAR     STRESS  2  [place] MFM FETAL BPP WO NST               60454.0     RAVI SHANKAR     ADDL GESTATION  3  [place] MFM OB FOLLOW UP                   76816.01    CORENTHIAN                                                       BOOKER  4  [place] MFM OB FOLLOW UP ADDL              98119.14    Jettie Pagan ----------------------------------------------------------------------  #  Order #                     Accession #                Episode #  1  [id number]                   [id number]  2  [id number]                   [id number]                 [id number]  3  [id number]                   [id number]                 [id number]  4  [id number]                   [id number]                 [id number] ---------------------------------------------------------------------- Indications  Hypertension - Chronic/Pre-existing            O10.019  (labetalol)  Twin pregnancy, di/di, third trimester         O30.043  A1c 5.1/5.4  Obesity complicating pregnancy, third          O99.213  trimester  LR NIPS  [redacted] weeks gestation of pregnancy                Z3A.35 ---------------------------------------------------------------------- Fetal Evaluation (Fetus A)  Num Of Fetuses:         2  Fetal Heart Rate(bpm):  164  Cardiac Activity:       Observed  Fetal Lie:              Maternal left side  Presentation:           Cephalic  Placenta:               Anterior  Membrane Desc:      Dividing Membrane seen  Amniotic Fluid  AFI FV:      Within normal limits                               Largest Pocket(cm)                              2.9 ---------------------------------------------------------------------- Biophysical Evaluation (Fetus A)  Amniotic F.V:   Within normal limits       F. Tone:        Observed  F. Movement:    Observed                   Score:          8/8  F. Breathing:   Observed ---------------------------------------------------------------------- Biometry (Fetus A)  BPD:        81  mm     G. Age:  32w 4d        2.6  %    CI:        69.29   %    70 - 86                                                          FL/HC:      22.2   %    20.1 - 22.3  HC:      310.7  mm     G. Age:  34w 5d         11  %  HC/AC:      1.03        0.93 - 1.11  AC:      300.7  mm     G. Age:  34w 0d         25  %    FL/BPD:     85.3   %    71 - 87  FL:       69.1  mm     G. Age:  35w 3d         52  %    FL/AC:      23.0   %    20 - 24  Est. FW:    2411  gm      5 lb 5 oz     26  %     FW Discordancy         9  % ---------------------------------------------------------------------- OB History  Gravidity:    1         Term:   0        Prem:   0        SAB:   0  TOP:          0       Ectopic:  0        Living: 0 ---------------------------------------------------------------------- Gestational Age (Fetus A)  LMP:           35w 1d        Date:  04/20/20                 EDD:   01/25/21  U/S Today:     34w 1d                                        EDD:   02/01/21  Best:          35w 1d     Det. By:  LMP  (04/20/20)          EDD:   01/25/21 ---------------------------------------------------------------------- Anatomy (Fetus A)  Cranium:               Appears normal         Aortic Arch:            Previously seen  Cavum:                 Previously seen        Ductal Arch:            Not well visualized  Ventricles:            Previously seen        Diaphragm:              Previously seen  Choroid Plexus:        Previously seen        Stomach:                Appears normal, left  sided  Cerebellum:            Previously seen        Abdomen:                Previously seen  Posterior Fossa:       Previously seen        Abdominal Wall:         Previously seen  Nuchal Fold:           Not applicable (>20    Cord Vessels:           Previously seen                         wks GA)  Face:                  Orbits previously      Kidneys:                Previously seen                         seen  Lips:                  Previously seen        Bladder:                Appears normal  Thoracic:              Previously seen        Spine:                  Previously seen  Heart:                 Appears normal         Upper Extremities:      Previously seen                         (4CH, axis, and                         situs)  RVOT:                  Previously seen        Lower Extremities:      Previously seen  LVOT:                  Previously seen  Other:  Technically difficult due to twin pregnancy, maternal habitus and fetal          position. ---------------------------------------------------------------------- Fetal Evaluation (Fetus B)  Num Of Fetuses:         2  Fetal Heart Rate(bpm):  130  Cardiac Activity:       Observed  Fetal Lie:              Maternal right side  Presentation:           Cephalic  Placenta:               Anterior  Membrane Desc:      Dividing Membrane seen  Amniotic Fluid  AFI FV:      Within normal limits                              Largest Pocket(cm)  6. ---------------------------------------------------------------------- Biophysical Evaluation (Fetus B)  Amniotic F.V:   Within normal limits       F. Tone:        Observed  F. Movement:    Observed                   Score:          8/8  F. Breathing:   Observed ---------------------------------------------------------------------- Biometry (Fetus B)  BPD:      89.6  mm     G. Age:  36w 2d         82  %    CI:        79.87   %    70 - 86                                                           FL/HC:      20.3   %    20.1 - 22.3  HC:      316.8  mm     G. Age:  35w 4d         27  %    HC/AC:      0.98        0.93 - 1.11  AC:      322.6  mm     G. Age:  36w 1d         83  %    FL/BPD:     71.9   %    71 - 87  FL:       64.4  mm     G. Age:  33w 2d          6  %    FL/AC:      20.0   %    20 - 24  Est. FW:    2661  gm    5 lb 14 oz      54  %     FW Discordancy      0 \ 9 % ---------------------------------------------------------------------- Gestational Age (Fetus B)  LMP:           35w 1d        Date:  04/20/20                 EDD:   01/25/21  U/S Today:     35w 2d                                        EDD:   01/24/21  Best:          35w 1d     Det. By:  LMP  (04/20/20)          EDD:   01/25/21 ---------------------------------------------------------------------- Anatomy (Fetus B)  Cranium:               Appears normal         Aortic Arch:            Previously seen  Cavum:                 Previously seen        Ductal Arch:  Previously seen  Ventricles:            Appears normal         Diaphragm:              Previously seen  Choroid Plexus:        Previously seen        Stomach:                Appears normal, left                                                                        sided  Cerebellum:            Previously seen        Abdomen:                Previously seen  Posterior Fossa:       Previously seen        Abdominal Wall:         Previously seen  Nuchal Fold:           Previously seen        Cord Vessels:           Previously seen  Face:                  Orbits and profile     Kidneys:                Previously seen                         previously seen  Lips:                  Not well visualized    Bladder:                Appears normal  Thoracic:              Previously seen        Spine:                  Previously seen  Heart:                 Previously seen        Upper Extremities:      Previously seen  RVOT:                   Not well visualized    Lower Extremities:      Previously seen  LVOT:                  Not well visualized  Other:  Technically difficult due to twin pregnancy, maternal habitus and fetal          position. ---------------------------------------------------------------------- Impression  Follow up growth for diamiotic dichorionic twin pregnancy.  Positive interval growth with Twin discordance of 9%  Biophysical profile 8/8  Ms. Mataya is seen today for consultation for inpatient  management of preeclampsia with severe features.  Please see consult for details.  I discussed the plan of care with Ms. Stoffer and Dr. Bonney Aid. ----------------------------------------------------------------------  Lin Landsman, MD Electronically Signed Final Report   12/22/2020 04:00 pm ----------------------------------------------------------------------  [place] MFM OB FOLLOW UP ADDL GEST  Result Date: 11/28/2020 ----------------------------------------------------------------------  OBSTETRICS REPORT                       (Signed Final 11/28/2020 04:38 pm) ---------------------------------------------------------------------- Patient Info  ID #:       [id number]                          D.O.B.:  [birth year] (30 yrs)  Name:       Samantha Glass               Visit Date: 11/28/2020 02:29 pm ---------------------------------------------------------------------- Performed By  Attending:        Noralee Space MD        Ref. Address:     [address], [place],                                                             [place] [address]  Performed By:     Charlyne Petrin     [facility]                                     [facility]                                                             [facility]  Referred By:      Natale Milch ---------------------------------------------------------------------- Orders  #  Description                           Code        Ordered By  1  [place] MFM OB FOLLOW UP                   19147.82    RAVI SHANKAR  2  [place] MFM  OB FOLLOW UP ADDL              [id number]    RAVI SHANKAR     GEST ----------------------------------------------------------------------  #  Order #                     Accession #                Episode #  1  [id number]                   [id number]                 [id number]  2  [id number]                   [id number]                 [id number] ---------------------------------------------------------------------- Indications  Hypertension - Chronic/Pre-existing            O10.019  (labetalol)  Twin pregnancy, di/di, third trimester         O30.043  A1c 5.1/5.4  LR NIPS  [redacted] weeks gestation of pregnancy                Z3A.31 ---------------------------------------------------------------------- Fetal Evaluation (Fetus A)  Num Of Fetuses:         2  Fetal Heart Rate(bpm):  140  Cardiac Activity:       Observed  Fetal Lie:              Maternal left side  Presentation:           Cephalic  Placenta:               Anterior  P. Cord Insertion:      Previously Visualized  Membrane Desc:      Dividing Membrane seen - Dichorionic.  Amniotic Fluid  AFI FV:      Within normal limits                              Largest Pocket(cm)                              4.16 ---------------------------------------------------------------------- Biometry (Fetus A)  BPD:      77.3  mm     G. Age:  31w 0d         21  %    CI:         71.2   %    70 - 86                                                          FL/HC:      20.6   %    19.1 - 21.3  HC:      291.8  mm     G. Age:  32w 1d         25  %    HC/AC:      1.05        0.96 - 1.17  AC:      278.7  mm     G. Age:  31w 6d  54  %    FL/BPD:     77.9   %    71 - 87  FL:       60.2  mm     G. Age:  31w 2d         26  %    FL/AC:      21.6   %     20 - 24  HUM:      53.3  mm     G. Age:  31w 0d         37  %  Est. FW:    1817  gm           4 lb     37  %     FW Discordancy         3  % ---------------------------------------------------------------------- Gestational Age (Fetus A)  LMP:           31w 2d        Date:  04/23/20                 EDD:   01/28/21  Clinical EDD:  31w 5d                                        EDD:   01/25/21  U/S Today:     31w 4d                                        EDD:   01/26/21  Best:          31w 5d     Det. By:  Clinical EDD             EDD:   01/25/21 ---------------------------------------------------------------------- Anatomy (Fetus A)  Cranium:               Appears normal         Aortic Arch:            Previously seen  Cavum:                 Previously seen        Ductal Arch:            Not well visualized  Ventricles:            Previously seen        Diaphragm:              Previously seen  Choroid Plexus:        Previously seen        Stomach:                Appears normal, left                                                                        sided  Cerebellum:            Previously seen        Abdomen:  Previously seen  Posterior Fossa:       Previously seen        Abdominal Wall:         Previously seen  Nuchal Fold:           Not applicable (>20    Cord Vessels:           Previously seen                         wks GA)  Face:                  Orbits previously      Kidneys:                Appear normal                         seen  Lips:                  Previously seen        Bladder:                Appears normal  Thoracic:              Previously seen        Spine:                  Previously seen  Heart:                 Appears normal         Upper Extremities:      Previously seen                         (4CH, axis, and                         situs)  RVOT:                  Previously seen        Lower Extremities:      Previously seen  LVOT:                  Previously seen  Other:   Technically difficult due to maternal habitus and fetal position.Open          hands previously visualized. ---------------------------------------------------------------------- Fetal Evaluation (Fetus B)  Num Of Fetuses:         2  Fetal Heart Rate(bpm):  150  Cardiac Activity:       Observed  Fetal Lie:              Maternal right side  Presentation:           Cephalic  Placenta:               Anterior  P. Cord Insertion:      Previously Visualized  Membrane Desc:      Dividing Membrane seen - Dichorionic.  Amniotic Fluid  AFI FV:      Within normal limits                              Largest Pocket(cm)  5.32 ---------------------------------------------------------------------- Biometry (Fetus B)  BPD:      83.4  mm     G. Age:  33w 4d         89  %    CI:        75.66   %    70 - 86                                                          FL/HC:      18.7   %    19.1 - 21.3  HC:       304   mm     G. Age:  33w 6d         70  %    HC/AC:      1.07        0.96 - 1.17  AC:      284.9  mm     G. Age:  32w 4d         72  %    FL/BPD:     68.1   %    71 - 87  FL:       56.8  mm     G. Age:  29w 6d        3.8  %    FL/AC:      19.9   %    20 - 24  HUM:      50.9  mm     G. Age:  29w 6d         10  %  LV:        5.5  mm  Est. FW:    1865  gm      4 lb 2 oz     45  %     FW Discordancy      0 \ 3 % ---------------------------------------------------------------------- Gestational Age (Fetus B)  LMP:           31w 2d        Date:  04/23/20                 EDD:   01/28/21  Clinical EDD:  31w 5d                                        EDD:   01/25/21  U/S Today:     32w 3d                                        EDD:   01/20/21  Best:          31w 5d     Det. By:  Clinical EDD             EDD:   01/25/21 ---------------------------------------------------------------------- Anatomy (Fetus B)  Cranium:               Appears normal         Aortic Arch:            Previously seen  Cavum:  Previously seen        Ductal Arch:            Previously seen  Ventricles:            Appears normal         Diaphragm:              Previously seen  Choroid Plexus:        Previously seen        Stomach:                Appears normal, left                                                                        sided  Cerebellum:            Previously seen        Abdomen:                Previously seen  Posterior Fossa:       Previously seen        Abdominal Wall:         Previously seen  Nuchal Fold:           Previously seen        Cord Vessels:           Previously seen  Face:                  Orbits and profile     Kidneys:                Appear normal                         previously seen  Lips:                  Not well visualized    Bladder:                Appears normal  Thoracic:              Previously seen        Spine:                  Previously seen  Heart:                 Appears normal         Upper Extremities:      Previously seen                         (4CH, axis, and                         situs)  RVOT:                  Not well visualized    Lower Extremities:      Previously seen  LVOT:                  Not well visualized  Other:  Technically difficult due to maternal habitus and fetal position. ---------------------------------------------------------------------- Cervix Uterus Adnexa  Cervix  Not visualized (advanced GA >24wks)  Uterus  Normal shape and size.  Right Ovary  Not visualized.  Left Ovary  Not visualized.  Cul De Sac  No free fluid seen. ---------------------------------------------------------------------- Impression  Dichorionic-diamniotic twin pregnancy.  Patient return for fetal  growth assessment.  Twin A: Maternal left, cephalic presentation, anterior  placenta.  Amniotic fluid is normal good fetal activity seen.  Fetal growth is appropriate for gestational age.  Twin B: Maternal right, cephalic presentation, anterior  placenta. Amniotic fluid is normal good fetal  activity seen.  Fetal growth is appropriate for gestational age.  Growth discordancy: 3% (normal).  Blood pressure today at her office is 137/95 mmHg.  Patient  does not have symptoms and signs of severe features of  preeclampsia.  She takes labetalol for hypertension. ---------------------------------------------------------------------- Recommendations  -Continue weekly BPP till delivery. ----------------------------------------------------------------------                  Noralee Space, MD Electronically Signed Final Report   11/28/2020 04:38 pm ----------------------------------------------------------------------  [place] MFM FETAL BPP WO NST ADDL GESTATION  Result Date: 12/22/2020 ----------------------------------------------------------------------  OBSTETRICS REPORT                       (Signed Final 12/22/2020 04:00 pm) ---------------------------------------------------------------------- Patient Info  ID #:       [id number]                          D.O.B.:  [birth year] (30 yrs)  Name:       Samantha Glass               Visit Date: 12/22/2020 02:14 pm ---------------------------------------------------------------------- Performed By  Attending:        Lin Landsman      Ref. Address:     [address]                    [place]                                                             [place], [place],                                                             [place] [address]  Performed By:     Birdena Crandall        Location:         [facility]                    [facility]                                 [facility] at  [facility]  Referred By:      Natale Milch ---------------------------------------------------------------------- Orders  #  Description                           Code        Ordered By  1  [place] MFM FETAL BPP WO NON               76819.01    RAVI SHANKAR     STRESS  2  [place] MFM FETAL BPP WO NST                16109.6     RAVI SHANKAR     ADDL GESTATION  3  [place] MFM OB FOLLOW UP                   76816.01    CORENTHIAN                                                       BOOKER  4  [place] MFM OB FOLLOW UP ADDL              04540.98    Jettie Pagan ----------------------------------------------------------------------  #  Order #                     Accession #                Episode #  1  [id number]                   [id number]                 [id number]  2  [id number]                   [id number]                 [id number]  3  [id number]                   [id number]                 [id number]  4  [id number]                   [id number]                 [id number] ---------------------------------------------------------------------- Indications  Hypertension - Chronic/Pre-existing            O10.019  (labetalol)  Twin pregnancy, di/di, third trimester         O30.043  A1c 5.1/5.4  Obesity complicating pregnancy, third          O99.213  trimester  LR NIPS  [redacted] weeks gestation of pregnancy                Z3A.35 ---------------------------------------------------------------------- Fetal Evaluation (Fetus A)  Num Of Fetuses:         2  Fetal Heart Rate(bpm):  164  Cardiac Activity:       Observed  Fetal Lie:              Maternal left side  Presentation:           Cephalic  Placenta:               Anterior  Membrane Desc:      Dividing Membrane seen  Amniotic Fluid  AFI FV:      Within normal limits                              Largest Pocket(cm)                              2.9 ---------------------------------------------------------------------- Biophysical Evaluation (Fetus A)  Amniotic F.V:   Within normal limits       F. Tone:        Observed  F. Movement:    Observed                   Score:          8/8  F. Breathing:   Observed ---------------------------------------------------------------------- Biometry (Fetus A)  BPD:        81  mm     G. Age:  32w 4d         2.6  %    CI:        69.29   %    70 - 86                                                          FL/HC:      22.2   %    20.1 - 22.3  HC:      310.7  mm     G. Age:  34w 5d         11  %    HC/AC:      1.03        0.93 - 1.11  AC:      300.7  mm     G. Age:  34w 0d         25  %    FL/BPD:     85.3   %    71 - 87  FL:       69.1  mm     G. Age:  35w 3d         52  %    FL/AC:      23.0   %    20 - 24  Est. FW:    2411  gm      5 lb 5 oz     26  %     FW Discordancy         9  % ---------------------------------------------------------------------- OB History  Gravidity:    1         Term:   0        Prem:   0        SAB:   0  TOP:  0       Ectopic:  0        Living: 0 ---------------------------------------------------------------------- Gestational Age (Fetus A)  LMP:           35w 1d        Date:  04/20/20                 EDD:   01/25/21  U/S Today:     34w 1d                                        EDD:   02/01/21  Best:          35w 1d     Det. By:  LMP  (04/20/20)          EDD:   01/25/21 ---------------------------------------------------------------------- Anatomy (Fetus A)  Cranium:               Appears normal         Aortic Arch:            Previously seen  Cavum:                 Previously seen        Ductal Arch:            Not well visualized  Ventricles:            Previously seen        Diaphragm:              Previously seen  Choroid Plexus:        Previously seen        Stomach:                Appears normal, left                                                                        sided  Cerebellum:            Previously seen        Abdomen:                Previously seen  Posterior Fossa:       Previously seen        Abdominal Wall:         Previously seen  Nuchal Fold:           Not applicable (>20    Cord Vessels:           Previously seen                         wks GA)  Face:                  Orbits previously      Kidneys:                Previously seen                          seen  Lips:  Previously seen        Bladder:                Appears normal  Thoracic:              Previously seen        Spine:                  Previously seen  Heart:                 Appears normal         Upper Extremities:      Previously seen                         (4CH, axis, and                         situs)  RVOT:                  Previously seen        Lower Extremities:      Previously seen  LVOT:                  Previously seen  Other:  Technically difficult due to twin pregnancy, maternal habitus and fetal          position. ---------------------------------------------------------------------- Fetal Evaluation (Fetus B)  Num Of Fetuses:         2  Fetal Heart Rate(bpm):  130  Cardiac Activity:       Observed  Fetal Lie:              Maternal right side  Presentation:           Cephalic  Placenta:               Anterior  Membrane Desc:      Dividing Membrane seen  Amniotic Fluid  AFI FV:      Within normal limits                              Largest Pocket(cm)                              6. ---------------------------------------------------------------------- Biophysical Evaluation (Fetus B)  Amniotic F.V:   Within normal limits       F. Tone:        Observed  F. Movement:    Observed                   Score:          8/8  F. Breathing:   Observed ---------------------------------------------------------------------- Biometry (Fetus B)  BPD:      89.6  mm     G. Age:  36w 2d         82  %    CI:        79.87   %    70 - 86                                                          FL/HC:      20.3   %  20.1 - 22.3  HC:      316.8  mm     G. Age:  35w 4d         27  %    HC/AC:      0.98        0.93 - 1.11  AC:      322.6  mm     G. Age:  36w 1d         83  %    FL/BPD:     71.9   %    71 - 87  FL:       64.4  mm     G. Age:  33w 2d          6  %    FL/AC:      20.0   %    20 - 24  Est. FW:    2661  gm    5 lb 14 oz      54  %     FW Discordancy      0 \ 9 %  ---------------------------------------------------------------------- Gestational Age (Fetus B)  LMP:           35w 1d        Date:  04/20/20                 EDD:   01/25/21  U/S Today:     35w 2d                                        EDD:   01/24/21  Best:          35w 1d     Det. By:  LMP  (04/20/20)          EDD:   01/25/21 ---------------------------------------------------------------------- Anatomy (Fetus B)  Cranium:               Appears normal         Aortic Arch:            Previously seen  Cavum:                 Previously seen        Ductal Arch:            Previously seen  Ventricles:            Appears normal         Diaphragm:              Previously seen  Choroid Plexus:        Previously seen        Stomach:                Appears normal, left                                                                        sided  Cerebellum:            Previously seen        Abdomen:  Previously seen  Posterior Fossa:       Previously seen        Abdominal Wall:         Previously seen  Nuchal Fold:           Previously seen        Cord Vessels:           Previously seen  Face:                  Orbits and profile     Kidneys:                Previously seen                         previously seen  Lips:                  Not well visualized    Bladder:                Appears normal  Thoracic:              Previously seen        Spine:                  Previously seen  Heart:                 Previously seen        Upper Extremities:      Previously seen  RVOT:                  Not well visualized    Lower Extremities:      Previously seen  LVOT:                  Not well visualized  Other:  Technically difficult due to twin pregnancy, maternal habitus and fetal          position. ---------------------------------------------------------------------- Impression  Follow up growth for diamiotic dichorionic twin pregnancy.  Positive interval growth with Twin discordance of 9%  Biophysical profile 8/8   Ms. Brazeau is seen today for consultation for inpatient  management of preeclampsia with severe features.  Please see consult for details.  I discussed the plan of care with Ms. Doster and Dr. Bonney Aid. ----------------------------------------------------------------------               Lin Landsman, MD Electronically Signed Final Report   12/22/2020 04:00 pm ----------------------------------------------------------------------  [place] MFM FETAL BPP WO NST ADDL GESTATION  Result Date: 12/14/2020 ----------------------------------------------------------------------  OBSTETRICS REPORT                       (Signed Final 12/14/2020 01:58 pm) ---------------------------------------------------------------------- Patient Info  ID #:       [id number]                          D.O.B.:  1990-01-17 (30 yrs)  Name:       Samantha Glass               Visit Date: 12/14/2020 10:11 am ---------------------------------------------------------------------- Performed By  Attending:        Ma Rings MD         Ref. Address:     [address], [place],                                                             [place] [address]  Performed By:     Eden Lathe BS      [facility]                                 [facility]  Referred By:      Natale Milch ---------------------------------------------------------------------- Orders  #  Description                           Code        Ordered By  1  [place] MFM FETAL BPP WO NON               76819.01    RAVI SHANKAR     STRESS  2  [place] MFM FETAL BPP WO NST               76819.1     RAVI [facility]     ADDL GESTATION ----------------------------------------------------------------------  #  Order #                      Accession #                Episode #  1  [id number]                   [id number]                 [id number]  2  [id number]                   [id number]                 [id number] ---------------------------------------------------------------------- Indications  Twin pregnancy, di/di, third trimester         O30.043  Hypertension - Chronic/Pre-existing            O10.019  (labetalol)  [redacted] weeks gestation of pregnancy                Z3A.34  A1c 5.1/5.4  LR  NIPS ---------------------------------------------------------------------- Fetal Evaluation (Fetus A)  Num Of Fetuses:         2  Fetal Heart Rate(bpm):  157  Cardiac Activity:       Observed  Fetal Lie:              Maternal left side  Presentation:           Cephalic  Placenta:               Anterior  P. Cord Insertion:      Previously Visualized  Membrane Desc:      Dividing Membrane seen - Dichorionic.  Amniotic Fluid  AFI FV:      Within normal limits                              Largest Pocket(cm)                              3.4 ---------------------------------------------------------------------- Biophysical Evaluation (Fetus A)  Amniotic F.V:   Pocket => 2 cm             F. Tone:        Observed  F. Movement:    Observed                   Score:          8/8  F. Breathing:   Observed ---------------------------------------------------------------------- OB History  Gravidity:    1         Term:   0        Prem:   0        SAB:   0  TOP:          0       Ectopic:  0        Living: 0 ---------------------------------------------------------------------- Gestational Age (Fetus A)  LMP:           34w 0d        Date:  04/20/20                 EDD:   01/25/21  Best:          34w 0d     Det. By:  LMP  (04/20/20)          EDD:   01/25/21 ---------------------------------------------------------------------- Fetal Evaluation (Fetus B)  Num Of Fetuses:         2  Fetal Heart Rate(bpm):  140  Cardiac Activity:       Observed  Fetal Lie:               Maternal right side  Presentation:           Cephalic  Placenta:               Anterior  P. Cord Insertion:      Previously Visualized  Membrane Desc:      Dividing Membrane seen - Dichorionic.  Amniotic Fluid  AFI FV:      Within normal limits                              Largest Pocket(cm)  4.2 ---------------------------------------------------------------------- Biophysical Evaluation (Fetus B)  Amniotic F.V:   Within normal limits       F. Tone:        Observed  F. Movement:    Observed                   Score:          8/8  F. Breathing:   Observed ---------------------------------------------------------------------- Gestational Age (Fetus B)  LMP:           34w 0d        Date:  04/20/20                 EDD:   01/25/21  Best:          34w 0d     Det. By:  LMP  (04/20/20)          EDD:   01/25/21 ---------------------------------------------------------------------- Cervix Uterus Adnexa  Cervix  Not visualized (advanced GA >24wks)  Uterus  No abnormality visualized.  Cul De Sac  No free fluid seen.  Adnexa  No abnormality visualized. ---------------------------------------------------------------------- Comments  This patient was seen for a biophysical profile due to a  dichorionic, diamniotic twin gestation with chronic  hypertension that is treated with both labetalol and Norvasc.  Her blood pressures in our office today were 140/93 and  134/93.  She denies any signs or symptoms of preeclampsia.  A biophysical profile performed today was 8 out of 8 twin A  and twin B.  There was normal amniotic fluid noted on today's ultrasound  exam around both fetuses.  Due to chronic hypertension and a dichorionic twin  pregnancy, delivery may be considered at between 36 to 37  weeks.  Preeclampsia precautions were reviewed today.  She will return in 1 week for another BPP. ----------------------------------------------------------------------                   Ma Rings, MD Electronically  Signed Final Report   12/14/2020 01:58 pm ----------------------------------------------------------------------  [place] MFM FETAL BPP WO NST ADDL GESTATION  Result Date: 12/08/2020 ----------------------------------------------------------------------  OBSTETRICS REPORT                       (Signed Final 12/08/2020 08:37 am) ---------------------------------------------------------------------- Patient Info  ID #:       [id number]                          D.O.B.:  1990-10-24 (30 yrs)  Name:       Samantha Glass               Visit Date: 12/08/2020 08:20 am ---------------------------------------------------------------------- Performed By  Attending:        Lin Landsman      Ref. Address:     [address]                    [place]                                                             [address], [place],  [place] [address]  Performed By:     Marcellina Millin          [facility]                    RDMS                                     [facility]  Referred By:      Natale Milch ---------------------------------------------------------------------- Orders  #  Description                           Code        Ordered By  1  [place] MFM FETAL BPP WO NON               76819.01    RAVI SHANKAR     STRESS  2  [place] MFM FETAL BPP WO NST               76819.1     RAVI [facility]     ADDL GESTATION ----------------------------------------------------------------------  #  Order #                     Accession #                Episode #  1  [id number]                   [id number]                 [id number]  2  [id number]                   [id number]                 [id number] ---------------------------------------------------------------------- Indications  Hypertension -  Chronic/Pre-existing            O10.019  (labetalol)  [redacted] weeks gestation of pregnancy                Z3A.33  Twin pregnancy, di/di, third trimester         O30.043  A1c 5.1/5.4  LR NIPS ---------------------------------------------------------------------- Fetal Evaluation (Fetus A)  Num Of Fetuses:         2  Fetal Heart Rate(bpm):  152  Cardiac Activity:       Observed  Fetal Lie:              Maternal left side  Presentation:           Cephalic  Placenta:               Anterior  P. Cord Insertion:      Previously Visualized  Membrane Desc:      Dividing Membrane seen - Dichorionic.  Amniotic Fluid  AFI FV:      Within normal limits                              Largest Pocket(cm)                              3.1 ---------------------------------------------------------------------- Biophysical Evaluation (Fetus A)  Amniotic F.V:   Within normal limits       F. Tone:        Observed  F. Movement:    Observed                   Score:          8/8  F. Breathing:   Observed ---------------------------------------------------------------------- Gestational Age (Fetus A)  LMP:           32w 5d        Date:  04/23/20                 EDD:   01/28/21  Clinical EDD:  33w 1d                                        EDD:   01/25/21  Best:          33w 1d     Det. By:  Clinical EDD             EDD:   01/25/21 ---------------------------------------------------------------------- Anatomy (Fetus A)  Stomach:               Appears normal, left   Bladder:                Appears normal                         sided ---------------------------------------------------------------------- Fetal Evaluation (Fetus B)  Num Of Fetuses:         2  Fetal Heart Rate(bpm):  138  Cardiac Activity:       Observed  Fetal Lie:              Maternal right side  Presentation:           Cephalic  Placenta:               Anterior  P. Cord Insertion:      Previously Visualized  Membrane Desc:      Dividing Membrane seen - Dichorionic.  Amniotic Fluid   AFI FV:      Within normal limits                              Largest Pocket(cm)                              3.7 ---------------------------------------------------------------------- Biophysical Evaluation (Fetus B)  Amniotic F.V:   Within normal limits  F. Tone:        Observed  F. Movement:    Observed                   Score:          8/8  F. Breathing:   Observed ---------------------------------------------------------------------- Gestational Age (Fetus B)  LMP:           32w 5d        Date:  04/23/20                 EDD:   01/28/21  Clinical EDD:  33w 1d                                        EDD:   01/25/21  Best:          33w 1d     Det. By:  Clinical EDD             EDD:   01/25/21 ---------------------------------------------------------------------- Anatomy (Fetus B)  Stomach:               Appears normal, left   Bladder:                Appears normal                         sided ---------------------------------------------------------------------- Impression  Antenatal testing performed given maternal twin pregnancy  with known chronic hypertension.  Prior growth with 3% discordance, with growth appropriate for  gestational age on 11/28  Twin A normal stomach, amniotic fluid, bladder and BPP 8/8-  Maternal left, Cephalic  Twin B normal stomach, amniotic fluid, bladder and BPP 8/8-  Maternal right, Cephalic  Blood pressure is stable 136/96 with repeat of 142/94 mmHg  she denies s/sx of preeclampsia ---------------------------------------------------------------------- Recommendations  Continue weekly testing  Repeat growth in 2 weeks. ----------------------------------------------------------------------               Lin Landsman, MD Electronically Signed Final Report   12/08/2020 08:37 am ----------------------------------------------------------------------   Assessment   30 y.o. G1P0 at [redacted]w[redacted]d by Estimated Date of Delivery: 01/25/21 with Di/Di twin gestation, severe preeclampsia by  neurologic criteria  Plan    1) Severe preeclampsia - escalating oral anti-hypertensive therapy this week labetalol now at 300mg  po bid, amlodipine 5mg  with increasing BP, none severe range.  Labs remain stable, mild thrombocytopenia.  She continues to report a low intensity but constant headache.  Discussed with Dr. Grace Bushy who believed proceeding with IOL is warranted given to whole of the clinical picture.  The patient received BMZ x 2 earlier this week, GBS was negative.  Vtx/Vtx presentation with 9% discordance.   - will proceed with cytotec IOL - pros and cons of C-section vs IOL discussed.  Need for double setup for delivery discussed.

## 2020-12-23 ENCOUNTER — Encounter
Admission: EM | Disposition: A | Discharge status: Home / Self Care | Payer: Self-pay | Source: Home / Self Care | Attending: Obstetrics & Gynecology

## 2020-12-23 ENCOUNTER — Inpatient Hospital Stay: Payer: Managed Care, Other (non HMO) | Admitting: Certified Registered Nurse Anesthetist

## 2020-12-23 DIAGNOSIS — O99214 Obesity complicating childbirth: Secondary | ICD-10-CM | POA: Diagnosis not present

## 2020-12-23 DIAGNOSIS — D62 Acute posthemorrhagic anemia: Secondary | ICD-10-CM

## 2020-12-23 DIAGNOSIS — O9903 Anemia complicating the puerperium: Secondary | ICD-10-CM

## 2020-12-23 DIAGNOSIS — O30043 Twin pregnancy, dichorionic/diamniotic, third trimester: Secondary | ICD-10-CM | POA: Diagnosis not present

## 2020-12-23 DIAGNOSIS — O1414 Severe pre-eclampsia complicating childbirth: Secondary | ICD-10-CM | POA: Diagnosis not present

## 2020-12-23 DIAGNOSIS — O1002 Pre-existing essential hypertension complicating childbirth: Secondary | ICD-10-CM | POA: Diagnosis not present

## 2020-12-23 LAB — CBC
HCT: 35.6 % — ABNORMAL LOW (ref 36.0–46.0)
Hemoglobin: 12.3 g/dL (ref 12.0–15.0)
MCH: 31.9 pg (ref 26.0–34.0)
MCHC: 34.6 g/dL (ref 30.0–36.0)
MCV: 92.5 fL (ref 80.0–100.0)
Platelets: 141 10*3/uL — ABNORMAL LOW (ref 150–400)
RBC: 3.85 MIL/uL — ABNORMAL LOW (ref 3.87–5.11)
RDW: 13.2 % (ref 11.5–15.5)
WBC: 12.6 10*3/uL — ABNORMAL HIGH (ref 4.0–10.5)
nRBC: 0 % (ref 0.0–0.2)

## 2020-12-23 LAB — RPR: RPR Ser Ql: NONREACTIVE

## 2020-12-23 SURGERY — Surgical Case
Anesthesia: Epidural

## 2020-12-23 MED ORDER — OXYTOCIN-SODIUM CHLORIDE 30-0.9 UT/500ML-% IV SOLN
1.0000 m[IU]/min | INTRAVENOUS | Status: DC
  Administered 2020-12-23: 18:00:00 10 m[IU]/min via INTRAVENOUS
  Administered 2020-12-23: 07:00:00 2 m[IU]/min via INTRAVENOUS
  Filled 2020-12-23 (×2): qty 500

## 2020-12-23 MED ORDER — BUPIVACAINE HCL (PF) 0.25 % IJ SOLN
INTRAMUSCULAR | Status: DC | PRN
  Administered 2020-12-23 (×2): 4 mL via EPIDURAL

## 2020-12-23 MED ORDER — FENTANYL-BUPIVACAINE-NACL 0.5-0.125-0.9 MG/250ML-% EP SOLN
EPIDURAL | Status: AC
  Filled 2020-12-23: qty 250

## 2020-12-23 MED ORDER — LIDOCAINE HCL (PF) 1 % IJ SOLN
INTRAMUSCULAR | Status: DC | PRN
  Administered 2020-12-23 (×2): 3 mL

## 2020-12-23 MED ORDER — FENTANYL-BUPIVACAINE-NACL 0.5-0.125-0.9 MG/250ML-% EP SOLN
EPIDURAL | Status: DC | PRN
  Administered 2020-12-23: 12 mL/h via EPIDURAL

## 2020-12-23 MED ORDER — LIDOCAINE 2% (20 MG/ML) 5 ML SYRINGE
INTRAMUSCULAR | Status: DC | PRN
  Administered 2020-12-23: 60 mg via INTRAVENOUS

## 2020-12-23 MED ORDER — LIDOCAINE-EPINEPHRINE (PF) 1.5 %-1:200000 IJ SOLN
INTRAMUSCULAR | Status: DC | PRN
  Administered 2020-12-23: 3 mL via EPIDURAL

## 2020-12-23 NOTE — Progress Notes (Signed)
RN at bedside adjusting [place] for baby B from 1500 to 1530

## 2020-12-23 NOTE — Transfer of Care (Signed)
Immediate Anesthesia Transfer of Care Note  Patient: Samantha Glass  Procedure(s) Performed: CESAREAN SECTION  Patient Location: Labor and delivery OR  Anesthesia Type:Epidural  Level of Consciousness: awake, alert  and oriented  Airway & Oxygen Therapy: Patient Spontanous Breathing  Post-op Assessment: Report given to RN and Post -op Vital signs reviewed and stable  Post vital signs: Reviewed and stable  Last Vitals:  Vitals Value Taken Time  BP    Temp    Pulse    Resp    SpO2      Last Pain:  Vitals:   12/23/20 2130  TempSrc: Oral  PainSc:       Patients Stated Pain Goal: 0 (12/17/20 0641)  Complications: No notable events documented.

## 2020-12-23 NOTE — Anesthesia Procedure Notes (Signed)
Epidural Patient location during procedure: OB Start time: 12/23/2020 1:20 PM End time: 12/23/2020 1:40 PM  Staffing Anesthesiologist: Yevette Edwards, MD Resident/CRNA: Jeanine Luz, CRNA Performed: resident/CRNA   Preanesthetic Checklist Completed: patient identified, IV checked, site marked, risks and benefits discussed, surgical consent, monitors and equipment checked and pre-op evaluation  Epidural Patient position: sitting Prep: ChloraPrep Patient monitoring: heart rate, continuous pulse ox and blood pressure Approach: midline Location: L3-L4 Injection technique: LOR saline  Needle:  Needle type: Tuohy  Needle gauge: 17 G Needle length: 9 cm and 9 Needle insertion depth: 9 cm Catheter type: closed end flexible Catheter size: 19 Gauge Catheter at skin depth: 14 cm Test dose: negative and 1.5% lidocaine with Epi 1:200 K  Assessment Events: blood not aspirated, injection not painful, no injection resistance, no paresthesia and negative IV test  Additional Notes 1 attempt Pt. Evaluated and documentation done after procedure finished. Patient identified. Risks/Benefits/Options discussed with patient including but not limited to bleeding, infection, nerve damage, paralysis, failed block, incomplete pain control, headache, blood pressure changes, nausea, vomiting, reactions to medication both or allergic, itching and postpartum back pain. Confirmed with bedside nurse the patient's most recent platelet count. Confirmed with patient that they are not currently taking any anticoagulation, have any bleeding history or any family history of bleeding disorders. Patient expressed understanding and wished to proceed. All questions were answered. Sterile technique was used throughout the entire procedure. Please see nursing notes for vital signs. Test dose was given through epidural catheter and negative prior to continuing to dose epidural or start infusion. Warning signs of high block  given to the patient including shortness of breath, tingling/numbness in hands, complete motor block, or any concerning symptoms with instructions to call for help. Patient was given instructions on fall risk and not to get out of bed. All questions and concerns addressed with instructions to call with any issues or inadequate analgesia.    Patient tolerated the insertion well without immediate complications.Reason for block:procedure for pain

## 2020-12-23 NOTE — Anesthesia Preprocedure Evaluation (Signed)
Anesthesia Evaluation  Patient identified by MRN, date of birth, ID band Patient awake    Reviewed: Allergy & Precautions, H&P , NPO status , Patient's Chart, lab work & pertinent test results  History of Anesthesia Complications Negative for: history of anesthetic complications  Airway Mallampati: II  TM Distance: >3 FB Neck ROM: full    Dental no notable dental hx.    Pulmonary neg pulmonary ROS,    Pulmonary exam normal        Cardiovascular hypertension, Normal cardiovascular exam     Neuro/Psych  Headaches, PSYCHIATRIC DISORDERS Depression    GI/Hepatic negative GI ROS, (+) Hepatitis -  Endo/Other  negative endocrine ROS  Renal/GU negative Renal ROS  negative genitourinary   Musculoskeletal negative musculoskeletal ROS (+)   Abdominal   Peds negative pediatric ROS (+)  Hematology  (+) Blood dyscrasia, anemia ,   Anesthesia Other Findings   Reproductive/Obstetrics (+) Pregnancy                             Anesthesia Physical Anesthesia Plan  ASA: 2  Anesthesia Plan: Epidural   Post-op Pain Management: Epidural   Induction:   PONV Risk Score and Plan:   Airway Management Planned:   Additional Equipment:   Intra-op Plan:   Post-operative Plan:   Informed Consent: I have reviewed the patients History and Physical, chart, labs and discussed the procedure including the risks, benefits and alternatives for the proposed anesthesia with the patient or authorized representative who has indicated his/her understanding and acceptance.       Plan Discussed with: Anesthesiologist and CRNA  Anesthesia Plan Comments:         Anesthesia Quick Evaluation

## 2020-12-23 NOTE — Progress Notes (Signed)
°  Labor Progress Note   30 y.o. G1P0 @ [redacted]w[redacted]d , admitted for  Pregnancy, Labor Management.   Subjective:  BP stable, no s/sx preeclamspia such has headache, blurry visionm CP, SOB Feels "weird" on magnesium  Objective:  BP (!) 154/100    Pulse 99    Temp 99.1 F (37.3 C) (Oral)    Resp 18    Ht 5\' 11"  (1.803 m)    Wt 135.3 kg    LMP 04/20/2020    SpO2 95%    BMI 41.59 kg/m  Abd: gravid, ND, FHT present, mild tenderness on exam Extr: trace to 1+ bilateral pedal edema SVE: C/C/0  EFM: FHR: 140/140 bpm, variability: moderate,  accelerations:  Present,  decelerations:  Absent Toco: Frequency: Every 4-6 minutes Labs: I have reviewed the patient's lab results.   Assessment & Plan:  G1P0 @ [redacted]w[redacted]d, admitted for  Pregnancy and Labor/Delivery Management due to Preeclampsia and twins  1. Pain management: epidural. 2. FWB: FHT category 1.  3. ID: GBS negative 4. Labor management: Second stage in double set up for twins  All discussed with patient, see orders  [redacted]w[redacted]d, MD, Annamarie Major Ob/Gyn, [facility] 12/23/2020  11:05 PM

## 2020-12-23 NOTE — Progress Notes (Signed)
° °  Subjective:  No concerns  Objective:   Vitals: Blood pressure (!) 140/93, pulse 89, temperature 99 F (37.2 C), temperature source Oral, resp. rate 16, height 5\' 11"  (1.803 m), weight 135.3 kg, last menstrual period 04/20/2020, SpO2 94 %. General: NAD Abdomen: gravid, non-tender Cervical Exam:  Dilation: 3 Effacement (%): 70 Station: -2 Exam by:: Dalynn Jhaveri MD AROM clear   FHT A: 135, moderate, +accels, no decels FHT B: 140, moderate, +accels, no decels Toco: q76min  Results for orders placed or performed during the hospital encounter of 12/17/20 (from the past 24 hour(s))  CBC     Status: Abnormal   Collection Time: 12/22/20  7:53 PM  Result Value Ref Range   WBC 13.0 (H) 4.0 - 10.5 K/uL   RBC 3.84 (L) 3.87 - 5.11 MIL/uL   Hemoglobin 12.0 12.0 - 15.0 g/dL   HCT 12/24/20 (L) 77.8 - 24.2 %   MCV 92.2 80.0 - 100.0 fL   MCH 31.3 26.0 - 34.0 pg   MCHC 33.9 30.0 - 36.0 g/dL   RDW 35.3 61.4 - 43.1 %   Platelets 148 (L) 150 - 400 K/uL   nRBC 0.2 0.0 - 0.2 %  Type and screen [facility]     Status: None (Preliminary result)   Collection Time: 12/22/20  7:53 PM  Result Value Ref Range   ABO/RH(D) PENDING    Antibody Screen PENDING    Sample Expiration      12/25/2020,2359 Performed at [facility], [address] [place], [place]   Type and screen     Status: None   Collection Time: 12/22/20  9:12 PM  Result Value Ref Range   ABO/RH(D) A POS    Antibody Screen NEG    Sample Expiration      12/25/2020,2359 Performed at [facility], [address], [place], [place]     Assessment:   30 y.o. G1P0 [redacted]w[redacted]d IOL Di/Di twin superimposed preeclampsia   Plan:   1) Labor  - AROM clear - start pitocin  2) Fetus - cat I tracing  3) Superimposed preeclampsia - continue magnesium sulfate, no treatable BP's  [redacted]w[redacted]d, MD, Vena Austria OB/GYN, [facility] 12/23/2020, 6:06 AM

## 2020-12-23 NOTE — Progress Notes (Signed)
° °  Subjective:  Feeling some mild cramping  Objective:   Vitals: Blood pressure (!) 155/105, pulse 84, temperature 97.6 F (36.4 C), temperature source Oral, resp. rate 14, height 5\' 11"  (1.803 m), weight 135.3 kg, last menstrual period 04/20/2020, SpO2 97 %.  Temp:  [97.6 F (36.4 C)-98.7 F (37.1 C)] 97.6 F (36.4 C) (12/22 2349) Pulse Rate:  [74-93] 84 (12/23 0052) Resp:  [14-20] 14 (12/22 2349) BP: (127-155)/(66-106) 155/105 (12/23 0052) SpO2:  [93 %-100 %] 97 % (12/23 0100) Weight:  [134.7 kg] 134.7 kg (12/22 1356)  General: NAD Abdomen: gravid, non-tender Cervical Exam:  Dilation: 1 Effacement (%): 50 Station: -3 Exam by:: [email or web address] MD  FHT A: 135 moderate, +accels no decels FHT B: 130 moderate, +accels, no decels Toco: q8-65min  Results for orders placed or performed during the hospital encounter of 12/17/20 (from the past 24 hour(s))  CBC     Status: Abnormal   Collection Time: 12/22/20  7:53 PM  Result Value Ref Range   WBC 13.0 (H) 4.0 - 10.5 K/uL   RBC 3.84 (L) 3.87 - 5.11 MIL/uL   Hemoglobin 12.0 12.0 - 15.0 g/dL   HCT 12/24/20 (L) 62.1 - 30.8 %   MCV 92.2 80.0 - 100.0 fL   MCH 31.3 26.0 - 34.0 pg   MCHC 33.9 30.0 - 36.0 g/dL   RDW 65.7 84.6 - 96.2 %   Platelets 148 (L) 150 - 400 K/uL   nRBC 0.2 0.0 - 0.2 %  Type and screen [facility]     Status: None (Preliminary result)   Collection Time: 12/22/20  7:53 PM  Result Value Ref Range   ABO/RH(D) PENDING    Antibody Screen PENDING    Sample Expiration      12/25/2020,2359 Performed at [facility], [address] [place], [place]   Type and screen     Status: None   Collection Time: 12/22/20  9:12 PM  Result Value Ref Range   ABO/RH(D) A POS    Antibody Screen NEG    Sample Expiration      12/25/2020,2359 Performed at [facility], [address] [place], [place]     Assessment:   30 y.o. G52P0 [redacted]w[redacted]d IOL Di/Di twins superimposed  preeclampsia with severe features  Plan:   1) Labor - continue cytotec IOL  2) Fetus - cat I tracing  3) Superimposed preeclampsia with severe features  - no treatable BP thus far - continue labetalol 300mg  po bid and amlodipine 5mg  - magnesium sulfate for seizure ppx  [redacted]w[redacted]d, MD, OB/GYN, [facility] 12/23/2020, 1:30 AM

## 2020-12-23 NOTE — Progress Notes (Signed)
Pt seen, plan of care discussed IOL for preeclampsia at 35 2/7 weeks, twins di/di and vtx/vtx yesterday  BP 140-150/90s    Pt has been receiving po doses of procardia and labetalol    Has IV dose orders in place Has shown some pain w ctxs and early dilation of cervix. AROM recent, clear FWR x2 Plans epidural Cont Pitocin Cont magnesium Discussed double set up delivery room  Annamarie Major, MD, [facility], [facility] 12/23/2020  7:27 AM

## 2020-12-23 NOTE — Progress Notes (Signed)
°  Labor Progress Note   30 y.o. G1P0 @ [redacted]w[redacted]d , admitted for  Pregnancy, Labor Management. IOL Preeclampsia, DiDi twins  Subjective:  Still coping well through contractions- increasing intesity. She is considering epidural soon. She has dizziness and nausea with movement.  Objective:  BP (!) 151/101    Pulse 83    Temp 97.7 F (36.5 C) (Axillary)    Resp 18    Ht 5\' 11"  (1.803 m)    Wt 135.3 kg    LMP 04/20/2020    SpO2 98%    BMI 41.59 kg/m  Abd: gravid, ND, FHT present, mild tenderness on exam Extr: trace to 1+ bilateral pedal edema SVE: CERVIX: 4.5 cm dilated, 80 effaced, -2 station Cervical sweep  EFM: FHR:  Twin A: 135 bpm, variability: moderate,  accelerations: Present,  no decelerations Twin B: 125 bpm, variability: moderate,  accelerations: Present,  no decelerations Toco: Frequency: Every 2-4 minutes Labs: I have reviewed the patient's lab results.   Assessment & Plan:  G1P0 @ [redacted]w[redacted]d, admitted for  Pregnancy and Labor/Delivery Management  1. Pain management:  position changes . Epidural as desired. 2. FWB: FHT category I for both twins.  3. ID: GBS negative 4. Labor management: continue pitocin titration  All discussed with patient, see orders   [redacted]w[redacted]d, CNM [facility] 12/23/2020  12:32 PM I

## 2020-12-23 NOTE — Progress Notes (Signed)
°  Labor Progress Note   30 y.o. G1P0 @ [redacted]w[redacted]d , admitted for  Pregnancy, Labor Management. IOL preeclampsia, di di twins  Subjective:  Comfortable with epidural  Objective:  BP 132/89    Pulse 79    Temp 97.9 F (36.6 C) (Oral)    Resp 16    Ht 5\' 11"  (1.803 m)    Wt 135.3 kg    LMP 04/20/2020    SpO2 96%    BMI 41.59 kg/m  Abd: gravid, ND, FHT present, mild tenderness on exam Extr: trace to 1+ bilateral pedal edema SVE: CERVIX: 5.5 cm dilated, 80 effaced, -2 station, bloody show IUPC placed  EFM:  Twin A: 125 bpm, moderate variability, +accelerations, -decelerations Twin B: 130 bpm, minimal to moderate variability, +accelerations, -decelerations Toco: Frequency: Every 4-5 minutes Labs: I have reviewed the patient's lab results.   Assessment & Plan:  G1P0 @ [redacted]w[redacted]d, admitted for  Pregnancy and Labor/Delivery Management  1. Pain management: epidural. 2. FWB: FHT category I x2.  3. ID: GBS negative 4. Labor management: continue pitocin titration  All discussed with patient, see orders   [redacted]w[redacted]d, CNM [facility] 12/23/2020  3:39 PM

## 2020-12-24 ENCOUNTER — Encounter: Payer: Self-pay | Admitting: Obstetrics and Gynecology

## 2020-12-24 DIAGNOSIS — Z3A35 35 weeks gestation of pregnancy: Secondary | ICD-10-CM

## 2020-12-24 LAB — CBC
HCT: 31.4 % — ABNORMAL LOW (ref 36.0–46.0)
HCT: 28.9 % — ABNORMAL LOW (ref 36.0–46.0)
HCT: 25.1 % — ABNORMAL LOW (ref 36.0–46.0)
Hemoglobin: 10.9 g/dL — ABNORMAL LOW (ref 12.0–15.0)
Hemoglobin: 10.1 g/dL — ABNORMAL LOW (ref 12.0–15.0)
Hemoglobin: 8.7 g/dL — ABNORMAL LOW (ref 12.0–15.0)
MCH: 31.8 pg (ref 26.0–34.0)
MCH: 32 pg (ref 26.0–34.0)
MCH: 32.2 pg (ref 26.0–34.0)
MCHC: 34.7 g/dL (ref 30.0–36.0)
MCHC: 34.9 g/dL (ref 30.0–36.0)
MCHC: 34.7 g/dL (ref 30.0–36.0)
MCV: 91.5 fL (ref 80.0–100.0)
MCV: 91.5 fL (ref 80.0–100.0)
MCV: 93 fL (ref 80.0–100.0)
Platelets: 146 10*3/uL — ABNORMAL LOW (ref 150–400)
Platelets: 149 10*3/uL — ABNORMAL LOW (ref 150–400)
Platelets: 140 10*3/uL — ABNORMAL LOW (ref 150–400)
RBC: 3.43 MIL/uL — ABNORMAL LOW (ref 3.87–5.11)
RBC: 3.16 MIL/uL — ABNORMAL LOW (ref 3.87–5.11)
RBC: 2.7 MIL/uL — ABNORMAL LOW (ref 3.87–5.11)
RDW: 13.1 % (ref 11.5–15.5)
RDW: 13.1 % (ref 11.5–15.5)
RDW: 13.3 % (ref 11.5–15.5)
WBC: 33.9 10*3/uL — ABNORMAL HIGH (ref 4.0–10.5)
WBC: 34.3 10*3/uL — ABNORMAL HIGH (ref 4.0–10.5)
WBC: 25.5 10*3/uL — ABNORMAL HIGH (ref 4.0–10.5)
nRBC: 0 % (ref 0.0–0.2)
nRBC: 0 % (ref 0.0–0.2)
nRBC: 0 % (ref 0.0–0.2)

## 2020-12-24 LAB — PROTIME-INR
INR: 1 (ref 0.8–1.2)
Prothrombin Time: 13.3 seconds (ref 11.4–15.2)

## 2020-12-24 LAB — APTT: aPTT: 25 seconds (ref 24–36)

## 2020-12-24 MED ORDER — IBUPROFEN 600 MG PO TABS
600.0000 mg | ORAL_TABLET | Freq: Four times a day (QID) | ORAL | Status: DC
  Administered 2020-12-24 – 2020-12-26 (×10): 600 mg via ORAL
  Filled 2020-12-24 (×10): qty 1

## 2020-12-24 MED ORDER — BENZOCAINE-MENTHOL 20-0.5 % EX AERO
1.0000 "application " | INHALATION_SPRAY | CUTANEOUS | Status: DC | PRN
  Filled 2020-12-24: qty 56

## 2020-12-24 MED ORDER — METHYLERGONOVINE MALEATE 0.2 MG/ML IJ SOLN
0.2000 mg | INTRAMUSCULAR | Status: DC | PRN
  Administered 2020-12-24: 0.2 mg via INTRAMUSCULAR

## 2020-12-24 MED ORDER — ACETAMINOPHEN 325 MG PO TABS
650.0000 mg | ORAL_TABLET | ORAL | Status: DC | PRN
  Administered 2020-12-24 (×3): 650 mg via ORAL
  Filled 2020-12-24 (×3): qty 2

## 2020-12-24 MED ORDER — COCONUT OIL OIL
1.0000 "application " | TOPICAL_OIL | Status: DC | PRN
  Filled 2020-12-24: qty 120

## 2020-12-24 MED ORDER — ONDANSETRON HCL 4 MG PO TABS: 4.0000 mg | ORAL_TABLET | ORAL | Status: DC | PRN

## 2020-12-24 MED ORDER — TETANUS-DIPHTH-ACELL PERTUSSIS 5-2.5-18.5 LF-MCG/0.5 IM SUSY
0.5000 mL | PREFILLED_SYRINGE | Freq: Once | INTRAMUSCULAR | Status: DC
  Filled 2020-12-24: qty 0.5

## 2020-12-24 MED ORDER — LACTATED RINGERS IV SOLN: INTRAVENOUS | Status: DC

## 2020-12-24 MED ORDER — METHYLERGONOVINE MALEATE 0.2 MG/ML IJ SOLN
INTRAMUSCULAR | Status: AC
  Filled 2020-12-24: qty 1

## 2020-12-24 MED ORDER — TRANEXAMIC ACID-NACL 1000-0.7 MG/100ML-% IV SOLN
INTRAVENOUS | Status: AC
  Filled 2020-12-24: qty 100

## 2020-12-24 MED ORDER — AMLODIPINE BESYLATE 5 MG PO TABS
5.0000 mg | ORAL_TABLET | Freq: Every day | ORAL | Status: DC
  Administered 2020-12-25: 23:00:00 5 mg via ORAL
  Filled 2020-12-24 (×3): qty 1

## 2020-12-24 MED ORDER — METHYLERGONOVINE MALEATE 0.2 MG/ML IJ SOLN: 0.2000 mg | Freq: Once | INTRAMUSCULAR | Status: DC

## 2020-12-24 MED ORDER — DIBUCAINE (PERIANAL) 1 % EX OINT: 1.0000 "application " | TOPICAL_OINTMENT | CUTANEOUS | Status: DC | PRN

## 2020-12-24 MED ORDER — ONDANSETRON HCL 4 MG/2ML IJ SOLN
4.0000 mg | INTRAMUSCULAR | Status: DC | PRN
  Administered 2020-12-24: 01:00:00 4 mg via INTRAVENOUS

## 2020-12-24 MED ORDER — WITCH HAZEL-GLYCERIN EX PADS: 1.0000 "application " | MEDICATED_PAD | CUTANEOUS | Status: DC | PRN

## 2020-12-24 MED ORDER — METHYLERGONOVINE MALEATE 0.2 MG PO TABS
0.2000 mg | ORAL_TABLET | ORAL | Status: DC | PRN
  Filled 2020-12-24 (×2): qty 1

## 2020-12-24 MED ORDER — DIPHENHYDRAMINE HCL 25 MG PO CAPS: 25.0000 mg | ORAL_CAPSULE | Freq: Four times a day (QID) | ORAL | Status: DC | PRN

## 2020-12-24 MED ORDER — SENNOSIDES-DOCUSATE SODIUM 8.6-50 MG PO TABS
2.0000 | ORAL_TABLET | Freq: Every day | ORAL | Status: DC
  Administered 2020-12-25: 10:00:00 2 via ORAL
  Filled 2020-12-24 (×2): qty 2

## 2020-12-24 MED ORDER — CARBOPROST TROMETHAMINE 250 MCG/ML IM SOLN
INTRAMUSCULAR | Status: AC
  Filled 2020-12-24: qty 1

## 2020-12-24 MED ORDER — MISOPROSTOL 200 MCG PO TABS
800.0000 ug | ORAL_TABLET | Freq: Once | ORAL | Status: AC
  Administered 2020-12-24: 800 ug via RECTAL

## 2020-12-24 MED ORDER — ONDANSETRON HCL 4 MG/2ML IJ SOLN
INTRAMUSCULAR | Status: AC
  Filled 2020-12-24: qty 2

## 2020-12-24 MED ORDER — OXYCODONE HCL 5 MG PO TABS: 5.0000 mg | ORAL_TABLET | Freq: Four times a day (QID) | ORAL | Status: DC | PRN

## 2020-12-24 MED ORDER — SIMETHICONE 80 MG PO CHEW: 80.0000 mg | CHEWABLE_TABLET | ORAL | Status: DC | PRN

## 2020-12-24 MED ORDER — PRENATAL MULTIVITAMIN CH
1.0000 | ORAL_TABLET | Freq: Every day | ORAL | Status: DC
  Administered 2020-12-24 – 2020-12-26 (×3): 1 via ORAL
  Filled 2020-12-24 (×3): qty 1

## 2020-12-24 NOTE — Progress Notes (Signed)
Subjective:  She is feeling well. Pain control is adequate. Foley in place. In bed pumping. Reports headache has resolved.   Objective:   Blood pressure 123/80, pulse 83, temperature 99 F (37.2 C), temperature source Axillary, resp. rate 14, height '5\' 11"'  (1.803 m), weight 135.3 kg, last menstrual period 04/20/2020, SpO2 97 %, unknown if currently breastfeeding.  General: NAD Pulmonary: no increased work of breathing Abdomen: non-distended, non-tender Uterus:  fundus firm at U; lochia normal Extremities: no edema, no erythema, no tenderness, no signs of DVT  Results for orders placed or performed during the hospital encounter of 12/17/20 (from the past 72 hour(s))  CBC     Status: Abnormal   Collection Time: 12/22/20  7:53 PM  Result Value Ref Range   WBC 13.0 (H) 4.0 - 10.5 K/uL   RBC 3.84 (L) 3.87 - 5.11 MIL/uL   Hemoglobin 12.0 12.0 - 15.0 g/dL   HCT 35.4 (L) 36.0 - 46.0 %   MCV 92.2 80.0 - 100.0 fL   MCH 31.3 26.0 - 34.0 pg   MCHC 33.9 30.0 - 36.0 g/dL   RDW 12.9 11.5 - 15.5 %   Platelets 148 (L) 150 - 400 K/uL   nRBC 0.2 0.0 - 0.2 %    Comment: Performed at [facility], [address] [place], [place] [address]  Type and screen [place]     Status: None (Preliminary result)   Collection Time: 12/22/20  7:53 PM  Result Value Ref Range   ABO/RH(D) PENDING    Antibody Screen PENDING    Sample Expiration      12/25/2020,2359 Performed at [facility], [address] [place], [place] [address]   RPR     Status: None   Collection Time: 12/22/20  7:53 PM  Result Value Ref Range   RPR Ser Ql NON REACTIVE NON REACTIVE    Comment: Performed at [facility], [address], [place], [place] [address]  Type and screen     Status: None   Collection Time: 12/22/20  9:12 PM  Result Value Ref Range   ABO/RH(D) A POS    Antibody Screen NEG    Sample Expiration      12/25/2020,2359 Performed at [facility], [address] [place], [place] [address]   CBC     Status: Abnormal   Collection Time: 12/23/20 12:53 PM  Result Value Ref Range   WBC 12.6 (H) 4.0 - 10.5 K/uL   RBC 3.85 (L) 3.87 - 5.11 MIL/uL   Hemoglobin 12.3 12.0 - 15.0 g/dL   HCT 35.6 (L) 36.0 - 46.0 %   MCV 92.5 80.0 - 100.0 fL   MCH 31.9 26.0 - 34.0 pg   MCHC 34.6 30.0 - 36.0 g/dL   RDW 13.2 11.5 - 15.5 %   Platelets 141 (L) 150 - 400 K/uL   nRBC 0.0 0.0 - 0.2 %    Comment: Performed at [facility], [address] [place], [place] [address]  CBC     Status: Abnormal   Collection Time: 12/24/20  2:43 AM  Result Value Ref Range   WBC 33.9 (H) 4.0 - 10.5 K/uL   RBC 3.43 (L) 3.87 - 5.11 MIL/uL   Hemoglobin 10.9 (L) 12.0 - 15.0 g/dL   HCT 31.4 (L) 36.0 - 46.0 %   MCV 91.5 80.0 - 100.0 fL   MCH 31.8 26.0 - 34.0 pg   MCHC 34.7 30.0 - 36.0 g/dL  RDW 13.1 11.5 - 15.5 %   Platelets 146 (L) 150 - 400 K/uL   nRBC 0.0 0.0 - 0.2 %    Comment: Performed at [facility], [address] [place], [place] [address]  Protime-INR     Status: None   Collection Time: 12/24/20  2:43 AM  Result Value Ref Range   Prothrombin Time 13.3 11.4 - 15.2 seconds   INR 1.0 0.8 - 1.2    Comment: (NOTE) INR goal varies based on device and disease states. Performed at [facility], [address] [place], [place] [address]   APTT     Status: None   Collection Time: 12/24/20  2:43 AM  Result Value Ref Range   aPTT 25 24 - 36 seconds    Comment: Performed at [facility], [address] [place], [place] [address]  CBC     Status: Abnormal   Collection Time: 12/24/20  6:02 AM  Result Value Ref Range   WBC 34.3 (H) 4.0 - 10.5 K/uL   RBC 3.16 (L) 3.87 - 5.11 MIL/uL   Hemoglobin 10.1 (L) 12.0 - 15.0 g/dL   HCT 28.9 (L) 36.0 - 46.0 %   MCV 91.5 80.0 - 100.0 fL   MCH 32.0 26.0 - 34.0 pg   MCHC 34.9 30.0 - 36.0 g/dL   RDW 13.1 11.5 - 15.5 %   Platelets 149 (L) 150 - 400 K/uL   nRBC 0.0 0.0 - 0.2 %     Comment: Performed at [facility], [address] [place], [place] [address]    Assessment:   30 y.o. [phone] postpartum day # 1  Plan:   1) Acute blood loss anemia and postpartum hemorrhage - hemodynamically stable and asymptomatic - po ferrous sulfate  2) Blood Type --/--/A POS (12/22 2112)   3) Rubella <0.90 (06/21 1035) / Varicella Immune  4) TDAP status - ordered Immunization History  Administered Date(s) Administered   Hepatitis B 03/26/2001   Hpv-Unspecified 08/03/2005, 10/11/2005, 05/01/2006   MMR 10/11/2005, 06/14/2008   MODERNA COVID-19 SARS-COV-2 PEDS BIVALENT BOOSTER 6Y-11Y 11/25/2020   Meningococcal Conjugate 08/03/2005   Moderna SARS-COV2 Booster Vaccination 12/21/2019   PFIZER(Purple Top)SARS-COV-2 Vaccination 03/20/2019, 04/15/2019   Td 08/03/2005, 06/14/2008    5) Feeding- breast   6) Contraception - not discussed  7) Preeclampsia-with CHTN- continue IV magnesium for 24 hours post delivery. Continue Norvasc and labetalol  8) Disposition - continue care  Adrian Prows MD [place] 12/24/2020 12:25 PM

## 2020-12-24 NOTE — Discharge Summary (Addendum)
OB Discharge Summary     Patient Name: Samantha Glass DOB: [birth year] MRN: [id number]  Date of admission: 12/17/2020 Delivering provider: Tresea Mall, CNM  Date of Delivery: 12/23/2020  Date of discharge: 12/26/2020  Admitting diagnosis: Pregnancy [Z34.90] Preeclampsia [O14.90] Intrauterine pregnancy: [redacted]w[redacted]d     Secondary diagnosis: Preeclampsia and di di twins     Discharge diagnosis: Preterm Pregnancy Delivered and Preeclampsia (severe)                                                                                                Post partum procedures: none  Augmentation: AROM, Pitocin, and Cytotec  Complications: Hemorrhage>1027mL  Hospital course:  Induction of Labor With Vaginal Delivery   30 y.o. yo G1P0 at [redacted]w[redacted]d was admitted to the hospital 12/17/2020 for induction of labor.  Indication for induction: Preeclampsia.  Patient had a labor course complicated by need for IV magnesium.  Details of delivery can be found in separate delivery note.  Patient had a postpartum course complicated by hemorrhage. She is asymptomatic at time of discharge. She received IV magnesium for 24 hours post delivery. She is breast and formula feeding at time of discharge. Her twins are doing well and able to discharge to home with parents. Patient is discharged home 12/26/20.    Membrane Rupture Time/Date:    Britni, Driscoll [[id number]]  5:55 AM    Clavel, Girl B Tacy [[id number]]  5:55 AM ,   Sorah, Falkenstein [[id number]]  12/23/2020    Milian, Girl JONAI WEYLAND [[id number]]  12/23/2020   Delivery Method:   Shellia Carwin [[id number]]  Vaginal, Spontaneous    Weight, Girl B Rebeca [[id number]]  Vaginal, Spontaneous  Episiotomy:    Mica, Ramdass [[id number]]  None    Hackbart, Girl B Loan [[id number]]  None  Lacerations:     Bibi, Economos [[id number]]  2nd degree    Sallie, Girl LANDEN BREELAND [[id number]]  2nd degree    Newborn  Data: Birth date:   Beyla, Loney [[id number]]  12/23/2020    Pendergraft, Girl JACAYLA NORDELL [[id number]]  12/23/2020  Birth time:   Takima, Encina [[id number]]  Emmeline Annette  11:30 PM    Basley, Girl B Brayleigh [[id number]] Lonie Peak  11:55 PM  Gender:   Zuleyma, Scharf [[id number]]  Female    Spayd, Girl AKINA MAISH [[id number]]  Female  Living status:   Briggett, Tuccillo [[id number]]  Living    Honsinger, Girl KENZINGTON MIELKE [[id number]]  Living  Apgars:   Cena, Bruhn [[id number]]  [address], Girl IZZY DOUBEK [[id number]]  8 ,   Ezinne, Yogi [[id number]]  [address], JASMEEN FRITSCH [[id number]]  9  Weight:   Elsy, Chiang [[id number]]  2110 g    Raybon, Girl B Lucinda [[id number]]  2450 g   Physical exam  Vitals:   12/26/20 0330 12/26/20 0745 12/26/20 1022 12/26/20 1106  BP: 112/67 135/87 138/84 138/88  Pulse: 79 80 73 78  Resp: 18     Temp: 98 F (36.7 C) 98.6 F (37  C)  98.7 F (37.1 C)  TempSrc: Oral Oral  Oral  SpO2: 98% 98%  100%  Weight:      Height:       General: alert, cooperative, and no distress Lochia: appropriate Uterine Fundus: firm Incision: N/A DVT Evaluation: No evidence of DVT seen on physical exam.  Labs: Lab Results  Component Value Date   WBC 25.5 (H) 12/24/2020   HGB 8.7 (L) 12/24/2020   HCT 25.1 (L) 12/24/2020   MCV 93.0 12/24/2020   PLT 140 (L) 12/24/2020    Discharge instruction: per After Visit Summary.  Medications:  Allergies as of 12/26/2020   No Known Allergies      Medication List     STOP taking these medications    aspirin EC 81 MG tablet       TAKE these medications    acetaminophen 325 MG tablet Commonly known as: TYLENOL Take 650 mg by mouth every 6 (six) hours as needed for headache.   amLODipine 5 MG tablet Commonly known as: NORVASC Take 1 tablet (5 mg total) by mouth daily.   buPROPion 300 MG 24 hr tablet Commonly known as:  WELLBUTRIN XL TAKE 1 TABLET BY MOUTH EVERY DAY   ibuprofen 600 MG tablet Commonly known as: ADVIL Take 1 tablet (600 mg total) by mouth every 6 (six) hours.   labetalol 200 MG tablet Commonly known as: NORMODYNE Take 1 tablet (200 mg total) by mouth 2 (two) times daily. What changed: how much to take   prenatal multivitamin Tabs tablet Take 1 tablet by mouth daily at 12 noon.   Vitron-C 65-125 MG Tabs Generic drug: Iron-Vitamin C Take 1 tablet by mouth at bedtime.        Diet: routine diet  Activity: Advance as tolerated. Pelvic rest for 6 weeks.   Outpatient follow up:  Follow-up Information     [facility]. Go in 1 week(s).   Specialty: Obstetrics and Gynecology Why: postpartum blood pressure check Contact information: [address] [place] [address] [phone]                  Postpartum contraception:  considering Paragard or permanent sterilization Rhogam Given postpartum: NA Rubella vaccine given postpartum: ordered Varicella vaccine given postpartum: immune TDaP given antepartum or postpartum: ordered  Newborn Data:   Seana, Underwood [[id number]]  Live born female  Birth Weight:   APGAR: ,   Newborn Delivery   Birth date/time: 12/23/2020 23:30:00 Delivery type:        Ayssa, Bentivegna [[id number]]  Live born female  Birth Weight:   APGAR: ,   Newborn Delivery   Birth date/time: 12/23/2020 23:55:00 Delivery type:        Baby Feeding: Breast  Disposition:home with mother  SIGNED:  Tresea Mall, CNM 12/26/2020 11:35 AM

## 2020-12-24 NOTE — Anesthesia Postprocedure Evaluation (Addendum)
Anesthesia Post Note  Patient: Samantha Glass  Procedure(s) Performed: CESAREAN SECTION  Patient location during evaluation: Mother Baby Anesthesia Type: Epidural Level of consciousness: awake and alert Pain management: pain level controlled Vital Signs Assessment: post-procedure vital signs reviewed and stable Respiratory status: spontaneous breathing, nonlabored ventilation and respiratory function stable Cardiovascular status: stable Postop Assessment: no headache, no backache and epidural receding Anesthetic complications: no Comments: Update 12/24 at 11:00am- Pt denies back pain, leg numbness, weakness, headache. She has not ambulated and has a foley catheter on IV magnesium.    No notable events documented.   Last Vitals:  Vitals:   12/23/20 2230 12/24/20 0103  BP: (!) 154/100 (!) 145/99  Pulse: 99   Resp:    Temp:    SpO2: 95%     Last Pain:  Vitals:   12/23/20 2130  TempSrc: Oral  PainSc:                  Kandi [place]

## 2020-12-25 NOTE — Progress Notes (Signed)
Admit Date: 12/17/2020 Today's Date: 12/25/2020  Post Partum Day 1  Subjective:  no complaints, up ad lib, voiding, and tolerating PO  Objective: Temp:  [98.2 F (36.8 C)-98.7 F (37.1 C)] 98.3 F (36.8 C) (12/25 0746) Pulse Rate:  [77-94] 77 (12/25 0746) Resp:  [14-20] 20 (12/25 0746) BP: (114-136)/(56-91) 128/80 (12/25 0746) SpO2:  [93 %-100 %] 99 % (12/25 0746)  Physical Exam:  General: alert, cooperative, and no distress Lochia: appropriate Uterine Fundus: firm Incision: none DVT Evaluation: No evidence of DVT seen on physical exam. Negative Homan's sign. No cords or calf tenderness. No significant calf/ankle edema.  Recent Labs    12/24/20 0602 12/24/20 2029  HGB 10.1* 8.7*  HCT 28.9* 25.1*    Assessment/Plan: Plan for discharge tomorrow, Breastfeeding, Contraception (plans Paraguard), and Infant doing well Infant twins doing well, in room w pt.  May be able to be discharged tomorrow as well. No s/sx persistent preeclampsia.  Cont meds for cHTN. Needs Rubella vacc. (MMR) Breast feeding   LOS: 8 days   [place] 12/25/2020, 9:47 AM

## 2020-12-26 MED ORDER — LABETALOL HCL 200 MG PO TABS: 200.0000 mg | ORAL_TABLET | Freq: Two times a day (BID) | ORAL | 1 refills | Status: DC

## 2020-12-26 MED ORDER — MEASLES, MUMPS & RUBELLA VAC IJ SOLR
0.5000 mL | Freq: Once | INTRAMUSCULAR | Status: DC
  Filled 2020-12-26 (×2): qty 0.5

## 2020-12-26 MED ORDER — IBUPROFEN 600 MG PO TABS: 600.0000 mg | ORAL_TABLET | Freq: Four times a day (QID) | ORAL | 0 refills | Status: DC

## 2020-12-26 NOTE — Discharge Instructions (Signed)

## 2020-12-26 NOTE — Lactation Note (Signed)
This note was copied from a baby's chart. Lactation Consultation Note  Patient Name: Samantha Glass Samantha Glass'S Date: 12/26/2020 Reason for consult: Initial assessment;Primapara;Late-preterm 34-36.6wks;Infant < 6lbs;Multiple gestation Age:30 hours  Lactation visit prior to anticipated discharge. First time mom delivered twins vaginally at [redacted]w[redacted]d 57 hours ago. Mom had severe pre-eclampsia and was hospital for 1 week prior to induction and delivery, received 24hr Mag2+ and had PP hemorrhage. Mom initially BF both babies 1-2x, and then due to health and energy began to bottle feed. Twins are doing well on higher calorie formula every 2-3 hours, but mom still desires to work on breastfeeding and building a milk supply. We discussed BF behaviors of preterm infants- their energy level and stamina for feedings will grow/change as baby gets closer to 40 weeks. Talked about importance of skin to skin, lick and learn/nuzzling at the breast prior to feedings, and importance of pumping while babies are being fed for stimulation and building of supply, frequency of pumping and consistency day and night.  Mom feeling guilty for not doing more, LC gave reassurance and positivity of all that mom has done for her babies so far. Encouraged her and dad to find a feeding plan that works best for them so they can enjoy the babies without added stress. Information for outpatient lactation services also given; encouraged to call for ongoing support as needed.    Maternal Data Has patient been taught Hand Expression?: Yes Does the patient have breastfeeding experience prior to this delivery?: No  Feeding Mother's Current Feeding Choice: Breast Milk and Formula  LATCH Score                    Lactation Tools Discussed/Used Tools: Bottle;Pump Breast pump type: Double-Electric Breast Pump Reason for Pumping: preterm  Interventions Interventions: Breast feeding basics reviewed;Hand  express;DEBP;Education  Discharge Discharge Education: Warning signs for feeding baby;Outpatient recommendation Pump: Personal (hands freeConley Canal)  Consult Status Consult Status: Complete    Danford Bad 12/26/2020, 9:54 AM

## 2020-12-26 NOTE — Progress Notes (Signed)
Patient discharged home with family.  Discharge instructions, when to follow up, and prescriptions reviewed with patient.  Patient verbalized understanding. Patient will be escorted out by auxiliary.   

## 2020-12-27 ENCOUNTER — Other Ambulatory Visit: Payer: Self-pay

## 2020-12-27 ENCOUNTER — Telehealth: Payer: Self-pay

## 2020-12-27 ENCOUNTER — Other Ambulatory Visit: Payer: Self-pay | Admitting: Obstetrics & Gynecology

## 2020-12-27 ENCOUNTER — Observation Stay
Admission: AD | Admit: 2020-12-27 | Discharge: 2020-12-28 | Disposition: A | Discharge status: Home / Self Care | Payer: Managed Care, Other (non HMO) | Source: Ambulatory Visit | Attending: Obstetrics & Gynecology | Admitting: Obstetrics & Gynecology

## 2020-12-27 ENCOUNTER — Encounter: Payer: Self-pay | Admitting: Obstetrics & Gynecology

## 2020-12-27 DIAGNOSIS — O1415 Severe pre-eclampsia, complicating the puerperium: Secondary | ICD-10-CM

## 2020-12-27 DIAGNOSIS — O1003 Pre-existing essential hypertension complicating the puerperium: Secondary | ICD-10-CM | POA: Diagnosis not present

## 2020-12-27 DIAGNOSIS — O1495 Unspecified pre-eclampsia, complicating the puerperium: Secondary | ICD-10-CM | POA: Diagnosis present

## 2020-12-27 LAB — CBC
HCT: 24.6 % — ABNORMAL LOW (ref 36.0–46.0)
Hemoglobin: 8.3 g/dL — ABNORMAL LOW (ref 12.0–15.0)
MCH: 32.4 pg (ref 26.0–34.0)
MCHC: 33.7 g/dL (ref 30.0–36.0)
MCV: 96.1 fL (ref 80.0–100.0)
Platelets: 260 10*3/uL (ref 150–400)
RBC: 2.56 MIL/uL — ABNORMAL LOW (ref 3.87–5.11)
RDW: 13.8 % (ref 11.5–15.5)
WBC: 15.6 10*3/uL — ABNORMAL HIGH (ref 4.0–10.5)
nRBC: 0.2 % (ref 0.0–0.2)

## 2020-12-27 MED ORDER — PRENATAL MULTIVITAMIN CH
1.0000 | ORAL_TABLET | Freq: Every day | ORAL | Status: DC
  Filled 2020-12-27: qty 1

## 2020-12-27 MED ORDER — LABETALOL HCL 5 MG/ML IV SOLN: 40.0000 mg | INTRAVENOUS | Status: DC | PRN

## 2020-12-27 MED ORDER — NIFEDIPINE 10 MG PO CAPS
20.0000 mg | ORAL_CAPSULE | ORAL | Status: DC | PRN
  Filled 2020-12-27: qty 2

## 2020-12-27 MED ORDER — DOCUSATE SODIUM 100 MG PO CAPS: 100.0000 mg | ORAL_CAPSULE | Freq: Every day | ORAL | Status: DC

## 2020-12-27 MED ORDER — ACETAMINOPHEN 325 MG PO TABS: 650.0000 mg | ORAL_TABLET | ORAL | Status: DC | PRN

## 2020-12-27 MED ORDER — ZOLPIDEM TARTRATE 5 MG PO TABS: 5.0000 mg | ORAL_TABLET | Freq: Every evening | ORAL | Status: DC | PRN

## 2020-12-27 MED ORDER — BUPROPION HCL ER (XL) 150 MG PO TB24: 300.0000 mg | ORAL_TABLET | Freq: Every day | ORAL | Status: DC

## 2020-12-27 MED ORDER — CALCIUM CARBONATE ANTACID 500 MG PO CHEW: 2.0000 | CHEWABLE_TABLET | ORAL | Status: DC | PRN

## 2020-12-27 MED ORDER — NIFEDIPINE ER 60 MG PO TB24
60.0000 mg | ORAL_TABLET | Freq: Every day | ORAL | Status: DC
  Filled 2020-12-27: qty 1

## 2020-12-27 MED ORDER — LABETALOL HCL 200 MG PO TABS
400.0000 mg | ORAL_TABLET | Freq: Two times a day (BID) | ORAL | Status: DC
  Filled 2020-12-27 (×2): qty 2

## 2020-12-27 MED ORDER — NIFEDIPINE 10 MG PO CAPS
10.0000 mg | ORAL_CAPSULE | ORAL | Status: DC | PRN
  Filled 2020-12-27: qty 1

## 2020-12-27 MED ORDER — LABETALOL HCL 200 MG PO TABS
400.0000 mg | ORAL_TABLET | Freq: Two times a day (BID) | ORAL | Status: DC
  Administered 2020-12-28 (×2): 400 mg via ORAL
  Filled 2020-12-27 (×2): qty 2

## 2020-12-27 MED ORDER — ACETAMINOPHEN 325 MG PO TABS
650.0000 mg | ORAL_TABLET | ORAL | Status: DC | PRN
  Administered 2020-12-28: 09:00:00 650 mg via ORAL
  Filled 2020-12-27: qty 2

## 2020-12-27 MED ORDER — NIFEDIPINE ER OSMOTIC RELEASE 30 MG PO TB24
60.0000 mg | ORAL_TABLET | Freq: Every day | ORAL | Status: DC
  Administered 2020-12-28: 01:00:00 60 mg via ORAL
  Filled 2020-12-27: qty 2

## 2020-12-27 MED ORDER — PRENATAL MULTIVITAMIN CH
1.0000 | ORAL_TABLET | Freq: Every day | ORAL | Status: DC
  Administered 2020-12-28: 12:00:00 1 via ORAL
  Filled 2020-12-27: qty 1

## 2020-12-27 MED ORDER — DOCUSATE SODIUM 100 MG PO CAPS
100.0000 mg | ORAL_CAPSULE | Freq: Every day | ORAL | Status: DC
  Filled 2020-12-27: qty 1

## 2020-12-27 MED ORDER — NIFEDIPINE ER OSMOTIC RELEASE 30 MG PO TB24: 60.0000 mg | ORAL_TABLET | Freq: Every day | ORAL | Status: DC

## 2020-12-27 NOTE — Progress Notes (Signed)
Phone call Discussed some elevated home blood pressures Pt denies ha, blurry vision, CP, SOB, epig pain. She has had some pedal edema since delivery of twins 3 days ago. She had dx of preeclampsia and received pp magnesium, and has been on Norvasc and Labetalol  Counseled to increase dose of Labetalol to 400 mg BID.  Cont Norvasc at night. Counseled on option for hospitalization for BP management if no improvement, or if sx's develop.  Annamarie Major, MD, [facility], [facility] 12/27/2020  5:20 PM

## 2020-12-27 NOTE — H&P (Signed)
Obstetrics & Gynecology History and Physical Note  Date of Consultation: 12/27/2020   Primary Care Provider: Irish Lack  Reason for Consultation: Elevated blood pressures, postpartum  History of Present Illness: Ms. Weidmann is a 30 y.o. G1P0102 (No LMP recorded.), with the above CC. She was noted to have preeclampsia with IOL of twins and subsequent vaginal delivery 3 days ago.  She did receive magnesium during labor and 24 hours postpartum.  She has had occasional mild headache. Denies blurry vision, CP, SOB, epigastric pain.  BP at home today elevated despite po meds (Labetalol, Norvasc).  ROS: A review of systems was performed and was complete and comprehensive, except as stated in the above HPI.  OBGYN History: As per HPI. 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   Hepatitis    middle school age   Hypertension    IDA (iron deficiency anemia) 04/14/2019   Obesity (BMI 30.0-34.9) 04/14/2019   UTI (urinary tract infection)     Past Surgical History: Past Surgical History:  Procedure Laterality Date   GALLBLADDER SURGERY  2021   TONSILLECTOMY  age 22   UMBILICAL HERNIA REPAIR N/A 11/24/2019   Procedure: HERNIA REPAIR UMBILICAL ADULT, open;  Surgeon: Henrene Dodge, MD;  Location: [facility] ORS;  Service: General;  Laterality: N/A;    Family History:  Family History  Problem Relation Age of Onset   Hypertension Mother    Depression Mother    Hypertension Maternal Grandmother    Arthritis Maternal Grandmother    Diabetes Paternal Grandmother    Hypertension Paternal Grandmother    Hyperlipidemia Paternal Grandmother    Hypertension Paternal Grandfather    She denies any female cancers,  bleeding or blood clotting disorders.   Social History:  Social History   Socioeconomic History   Marital status: Married    Spouse name: Not on file   Number of children: Not on file   Years of education: Not on file   Highest education level: Not on file  Occupational History   Not on file  Tobacco Use   Smoking status: Never   Smokeless tobacco: Never  Vaping Use   Vaping Use: Never used  Substance and Sexual Activity   Alcohol use: Not Currently    Comment: occassional   Drug use: No   Sexual activity: Yes    Birth control/protection: None    Comment: undecided  Other Topics Concern   Not on file  Social History Narrative   Not on file   Social Determinants of Health   Financial Resource Strain: Not on file  Food Insecurity: Not on file  Transportation Needs: Not on file  Physical Activity: Not on file  Stress: Not on file  Social Connections: Not on file  Intimate Partner Violence: Not on file    Allergy: No Known Allergies  Current Outpatient Medications: Medications Prior to Admission  Medication Sig Dispense Refill Last Dose   acetaminophen (TYLENOL) 325 MG tablet  Take 650 mg by mouth every 6 (six) hours as needed for headache.   12/26/2020   amLODipine (NORVASC) 5 MG tablet Take 1 tablet (5 mg total) by mouth daily. 90 tablet 1 12/27/2020   buPROPion (WELLBUTRIN XL) 300 MG 24 hr tablet TAKE 1 TABLET BY MOUTH EVERY DAY 90 tablet 3 12/27/2020   ibuprofen (ADVIL) 600 MG tablet Take 1 tablet (600 mg total) by mouth every 6 (six) hours. 30 tablet 0 12/26/2020   Iron-Vitamin C (VITRON-C) 65-125 MG TABS Take 1 tablet by mouth at bedtime.   12/26/2020   labetalol (NORMODYNE) 200 MG tablet Take 1 tablet (200 mg total) by mouth 2 (two) times daily. 180 tablet 1 12/27/2020   Prenatal Vit-Fe Fumarate-FA (PRENATAL MULTIVITAMIN) TABS tablet Take 1 tablet by mouth daily at 12 noon.   12/27/2020    Hospital Medications: Current Facility-Administered Medications   Medication Dose Route Frequency Provider Last Rate Last Admin   acetaminophen (TYLENOL) tablet 650 mg  650 mg Oral Q4H PRN Nadara Mustard, MD       calcium carbonate (TUMS - dosed in mg elemental calcium) chewable tablet 400 mg of elemental calcium  2 tablet Oral Q4H PRN Nadara Mustard, MD       [START ON 12/28/2020] docusate sodium (COLACE) capsule 100 mg  100 mg Oral Daily Nadara Mustard, MD       NIFEdipine (PROCARDIA) capsule 10 mg  10 mg Oral PRN Nadara Mustard, MD       And   NIFEdipine (PROCARDIA) capsule 20 mg  20 mg Oral PRN Nadara Mustard, MD       And   NIFEdipine (PROCARDIA) capsule 20 mg  20 mg Oral PRN Nadara Mustard, MD       And   labetalol (NORMODYNE) injection 40 mg  40 mg Intravenous PRN Nadara Mustard, MD       [START ON 12/28/2020] labetalol (NORMODYNE) tablet 400 mg  400 mg Oral BID Nadara Mustard, MD       [START ON 12/28/2020] NIFEdipine (PROCARDIA-XL/NIFEDICAL-XL) 24 hr tablet 60 mg  60 mg Oral Daily Nadara Mustard, MD       [START ON 12/28/2020] prenatal multivitamin tablet 1 tablet  1 tablet Oral Q1200 Nadara Mustard, MD       zolpidem (AMBIEN) tablet 5 mg  5 mg Oral QHS PRN Nadara Mustard, MD       Facility-Administered Medications Ordered in Other Encounters  Medication Dose Route Frequency Provider Last Rate Last Admin   acetaminophen (TYLENOL) tablet 650 mg  650 mg Oral Q4H PRN Nadara Mustard, MD       [START ON 12/28/2020] buPROPion (WELLBUTRIN XL) 24 hr tablet 300 mg  300 mg Oral Daily Nadara Mustard, MD       calcium carbonate (TUMS - dosed in mg elemental calcium) chewable tablet 400 mg of elemental calcium  2 tablet Oral Q4H PRN Nadara Mustard, MD       [START ON 12/28/2020] docusate sodium (COLACE) capsule 100 mg  100 mg Oral Daily Nadara Mustard, MD       NIFEdipine (PROCARDIA) capsule 10 mg  10 mg Oral PRN Nadara Mustard, MD       And   NIFEdipine (PROCARDIA) capsule 20 mg  20 mg Oral PRN Nadara Mustard, MD       And    NIFEdipine (PROCARDIA) capsule 20 mg  20 mg Oral PRN Lamar Naef,  Harrel Lemon, MD       And   labetalol (NORMODYNE) injection 40 mg  40 mg Intravenous PRN Nadara Mustard, MD       labetalol (NORMODYNE) tablet 400 mg  400 mg Oral BID Nadara Mustard, MD       [START ON 12/28/2020] NIFEdipine (ADALAT CC) 24 hr tablet 60 mg  60 mg Oral Daily Nadara Mustard, MD       [START ON 12/28/2020] prenatal multivitamin tablet 1 tablet  1 tablet Oral Q1200 Nadara Mustard, MD       zolpidem (AMBIEN) tablet 5 mg  5 mg Oral QHS PRN Nadara Mustard, MD        Physical Exam: Vitals:   12/27/20 2255  BP: (!) 155/106  Pulse: 81  Resp: 18  Temp: 98 F (36.7 C)  TempSrc: Oral  SpO2: 99%  Weight: 126.1 kg  Height: 5\' 11"  (1.803 m)    Temp:  [98 F (36.7 C)] 98 F (36.7 C) (12/27 2255) Pulse Rate:  [81] 81 (12/27 2255) Resp:  [18] 18 (12/27 2255) BP: (155)/(106) 155/106 (12/27 2255) SpO2:  [99 %] 99 % (12/27 2255) Weight:  [126.1 kg] 126.1 kg (12/27 2255) No intake/output data recorded. No intake/output data recorded. No intake or output data in the 24 hours ending 12/27/20 2344  Body mass index is 38.77 kg/m. Constitutional: Well nourished, well developed female in no acute distress.  HEENT: normal Neck:  Supple, normal appearance, and no thyromegaly  Cardiovascular:Regular rate and rhythm.  No murmurs, rubs or gallops. Respiratory:  Clear to auscultation bilateral. Normal respiratory effort Abdomen: positive bowel sounds and no masses, hernias; diffusely non tender to palpation, non distended Neuro: grossly intact Psych:  Normal mood and affect.  Skin:  Warm and dry.  MS: normal gait and normal bilateral lower extremity strength/ROM/symmetry Lymphatic:  No inguinal lymphadenopathy.   Assessment: Ms. Strohman is a 30 y.o. 26 (No LMP recorded.) who presented to the ED with complaints of elevated blood pressures; findings are consistent with preeclampsia in the postpartum time  period.  Plan: Admit to control BP better due to risks of elevations to self No further magnesium required as already given Labetalol 400 mg bid and Procardia XL 60 mg as oral routine regimen.  Will have back up meds for severe pressures.  To se if this will cover BPs well enough.   Labs Pt has twins.  Allow to pump or feed.  [id number], MD, Annamarie Major Ob/Gyn, [facility] 12/27/2020  11:44 PM Pager [phone]

## 2020-12-27 NOTE — Telephone Encounter (Signed)
Pt called triage delivered twins on 12/23 she's pm BP meds been monitoring her bp at home and its been 150 over low 100. Pt wants to know if she should increase her bp meds

## 2020-12-27 NOTE — Progress Notes (Signed)
Paged MD, awaiting orders. Patient admitted to room 341.

## 2020-12-28 ENCOUNTER — Encounter: Payer: Self-pay | Admitting: Obstetrics & Gynecology

## 2020-12-28 DIAGNOSIS — O1415 Severe pre-eclampsia, complicating the puerperium: Secondary | ICD-10-CM | POA: Diagnosis not present

## 2020-12-28 DIAGNOSIS — O1495 Unspecified pre-eclampsia, complicating the puerperium: Secondary | ICD-10-CM | POA: Diagnosis not present

## 2020-12-28 DIAGNOSIS — O1003 Pre-existing essential hypertension complicating the puerperium: Secondary | ICD-10-CM | POA: Diagnosis not present

## 2020-12-28 LAB — COMPREHENSIVE METABOLIC PANEL
ALT: 42 U/L (ref 0–44)
AST: 36 U/L (ref 15–41)
Albumin: 3.1 g/dL — ABNORMAL LOW (ref 3.5–5.0)
Alkaline Phosphatase: 91 U/L (ref 38–126)
Anion gap: 5 (ref 5–15)
BUN: 10 mg/dL (ref 6–20)
CO2: 23 mmol/L (ref 22–32)
Calcium: 8.9 mg/dL (ref 8.9–10.3)
Chloride: 107 mmol/L (ref 98–111)
Creatinine, Ser: 0.74 mg/dL (ref 0.44–1.00)
GFR, Estimated: >60 mL/min (ref >60)
Glucose, Bld: 106 mg/dL — ABNORMAL HIGH (ref 70–99)
Potassium: 4.1 mmol/L (ref 3.5–5.1)
Sodium: 135 mmol/L (ref 135–145)
Total Bilirubin: 0.5 mg/dL (ref 0.3–1.2)
Total Protein: 6.5 g/dL (ref 6.5–8.1)

## 2020-12-28 LAB — CBC
HCT: 25 % — ABNORMAL LOW (ref 36.0–46.0)
Hemoglobin: 8.3 g/dL — ABNORMAL LOW (ref 12.0–15.0)
MCH: 32.2 pg (ref 26.0–34.0)
MCHC: 33.2 g/dL (ref 30.0–36.0)
MCV: 96.9 fL (ref 80.0–100.0)
Platelets: 242 10*3/uL (ref 150–400)
RBC: 2.58 MIL/uL — ABNORMAL LOW (ref 3.87–5.11)
RDW: 13.5 % (ref 11.5–15.5)
WBC: 15.4 10*3/uL — ABNORMAL HIGH (ref 4.0–10.5)
nRBC: 0.2 % (ref 0.0–0.2)

## 2020-12-28 LAB — PROTEIN / CREATININE RATIO, URINE
Creatinine, Urine: 45 mg/dL
Protein Creatinine Ratio: 0.8 mg/mg{Cre} — ABNORMAL HIGH (ref 0.00–0.15)
Total Protein, Urine: 36 mg/dL

## 2020-12-28 MED ORDER — LABETALOL HCL 200 MG PO TABS: 400.0000 mg | ORAL_TABLET | Freq: Two times a day (BID) | ORAL | 1 refills | Status: DC

## 2020-12-28 MED ORDER — NIFEDIPINE ER 60 MG PO TB24: 60.0000 mg | ORAL_TABLET | Freq: Every day | ORAL | 1 refills | Status: DC

## 2020-12-28 NOTE — Discharge Summary (Signed)
Physician Discharge Summary  Patient ID: Samantha Glass MRN: [id number] DOB/AGE: 1990/05/22 30 y.o.  Admit date: 12/27/2020 Discharge date: 12/28/2020  Admission Diagnoses:  Discharge Diagnoses:  Principal Problem:   Preeclampsia in postpartum period   Discharged Condition: good  Hospital Course: The patient was admitted with severe range blood pressures at 3 days post delivery of twins (vaginal delivery) She had a hx of essential HTN, and was treated post delivery with 24 hours of magnesium for pre -eclampsia. She returned to the hospital after having elevated pressures at home. With adjustments in her medication regime, she is now feeling better and her pressures have improved. She is being discharged with plans to  be seen at the [facility] office tomorrow afternoon.   Consults: None  Significant Diagnostic Studies: labs:  Results for orders placed or performed during the hospital encounter of 12/27/20 (from the past 24 hour(s))  Comprehensive metabolic panel     Status: Abnormal   Collection Time: 12/28/20  1:05 AM  Result Value Ref Range   Sodium 135 135 - 145 mmol/L   Potassium 4.1 3.5 - 5.1 mmol/L   Chloride 107 98 - 111 mmol/L   CO2 23 22 - 32 mmol/L   Glucose, Bld 106 (H) 70 - 99 mg/dL   BUN 10 6 - 20 mg/dL   Creatinine, Ser 4.09 0.44 - 1.00 mg/dL   Calcium 8.9 8.9 - 81.1 mg/dL   Total Protein 6.5 6.5 - 8.1 g/dL   Albumin 3.1 (L) 3.5 - 5.0 g/dL   AST 36 15 - 41 U/L   ALT 42 0 - 44 U/L   Alkaline Phosphatase 91 38 - 126 U/L   Total Bilirubin 0.5 0.3 - 1.2 mg/dL   GFR, Estimated >91 >47 mL/min   Anion gap 5 5 - 15  CBC     Status: Abnormal   Collection Time: 12/28/20  1:05 AM  Result Value Ref Range   WBC 15.4 (H) 4.0 - 10.5 K/uL   RBC 2.58 (L) 3.87 - 5.11 MIL/uL   Hemoglobin 8.3 (L) 12.0 - 15.0 g/dL   HCT 82.9 (L) 56.2 - 13.0 %   MCV 96.9 80.0 - 100.0 fL   MCH 32.2 26.0 - 34.0 pg   MCHC 33.2 30.0 - 36.0 g/dL   RDW 86.5 78.4 - 69.6 %   Platelets 242 150 -  400 K/uL   nRBC 0.2 0.0 - 0.2 %   BP (!) 138/95    Pulse 83    Temp 98.3 F (36.8 C) (Oral)    Resp 18    Ht 5\' 11"  (1.803 m)    Wt 126.1 kg    SpO2 100%    BMI 38.77 kg/m    Treatments: adjustment of Labetalol from 200 mg po BID to 400 mg po BID, and started on Procardia XL 60 mg- one po q day.  Discharge Exam: Blood pressure (!) 138/95, pulse 83, temperature 98.3 F (36.8 C), temperature source Oral, resp. rate 18, height 5\' 11"  (1.803 m), weight 126.1 kg, SpO2 100 %, unknown if currently breastfeeding. General appearance: alert, cooperative, fatigued, no distress, and moderately obese Head: Normocephalic, without obvious abnormality, atraumatic Resp: clear to auscultation bilaterally Cardio: regular rate and rhythm, S1, S2 normal, no murmur, click, rub or gallop Extremities: some ;mild pedal edema noted. Improved from yesterday's exam Skin: Skin color, texture, turgor normal. No rashes or lesions  Disposition: Discharge disposition: 01-Home or Self Care       Discharge Instructions  Activity as tolerated   Complete by: As directed    Call MD for:   Complete by: As directed    Call MD for:  difficulty breathing, headache or visual disturbances   Complete by: As directed    Call MD for:  extreme fatigue   Complete by: As directed    Call MD for:  hives   Complete by: As directed    Call MD for:  persistant dizziness or light-headedness   Complete by: As directed    Call MD for:  persistant nausea and vomiting   Complete by: As directed    Call MD for:  redness, tenderness, or signs of infection (pain, swelling, redness, odor or green/yellow discharge around incision site)   Complete by: As directed    Call MD for:  severe uncontrolled pain   Complete by: As directed    Call MD for:  temperature >100.4   Complete by: As directed    Diet - low sodium heart healthy   Complete by: As directed    Sexual activity   Complete by: As directed    Avoid intercourse for 6  weeks      Allergies as of 12/28/2020   No Known Allergies      Medication List     STOP taking these medications    amLODipine 5 MG tablet Commonly known as: NORVASC       TAKE these medications    acetaminophen 325 MG tablet Commonly known as: TYLENOL Take 650 mg by mouth every 6 (six) hours as needed for headache.   buPROPion 300 MG 24 hr tablet Commonly known as: WELLBUTRIN XL TAKE 1 TABLET BY MOUTH EVERY DAY   ibuprofen 600 MG tablet Commonly known as: ADVIL Take 1 tablet (600 mg total) by mouth every 6 (six) hours.   labetalol 200 MG tablet Commonly known as: NORMODYNE Take 2 tablets (400 mg total) by mouth 2 (two) times daily. Start taking on: December 29, 2020 What changed: how much to take   NIFEdipine 60 MG 24 hr tablet Commonly known as: ADALAT CC Take 1 tablet (60 mg total) by mouth daily.   prenatal multivitamin Tabs tablet Take 1 tablet by mouth daily at 12 noon.   Vitron-C 65-125 MG Tabs Generic drug: Iron-Vitamin C Take 1 tablet by mouth at bedtime.       Pateint provided careful instructions to rest at home, follow regular diet. May bathe. No housework or driving. Will follow up at [facility] tomorrow afternoon. Prescriptions for Labetalol and Procardia XL 60 have been sent to the pharmacy, and also called in by this provider.  Signed: Mirna Mires 12/28/2020, 7:07 PM

## 2020-12-28 NOTE — Discharge Summary (Signed)
Mfryer, CNM on unit at 1900 to discuss dc, follow up appt and prescriptions given to pt. Dc instructions reviewed with pt. Pt verbalizes understanding of all dc instructions. F/u appt for tomorrow. Prescriptions called into pharmacy already. VS obtained and SL dc'd. Pt dc'd to home via w/c.

## 2020-12-28 NOTE — Lactation Note (Signed)
Lactation Consultation Note  Patient Name: Samantha Glass [id number] Date: 12/28/2020   Age:30 y.o.  Lactation check-in. Mom readmitted for elevated BP's after discharge. She delivered twins 4 days ago, left hospital primarily formula feeding while she was working to build supply through pumping. Mom has been set-up with DEBP at bedside and has used routinely now achieving approximately 51mL per pumping session. LC praised mom for her dedication and encouraged continued consistency and frequency with pumping to continue building supply for her twins.     Danford Bad 12/28/2020, 10:06 AM

## 2020-12-28 NOTE — Progress Notes (Signed)
Admit Date: 12/27/2020 Today's Date: 12/28/2020  Admission day #1, Post Partum Day #4  Subjective:  Pt has been havin mild headache off and on, none now. Denies blurry vision, CP, SOB, epigastric pain, edema. Mod lochia, improving No breast complaints, no depression  Objective: Temp:  [98 F (36.7 C)] 98 F (36.7 C) (12/27 2255) Pulse Rate:  [78-84] 84 (12/28 0421) Resp:  [18] 18 (12/27 2255) BP: (152-157)/(98-106) 157/98 (12/28 0421) SpO2:  [99 %-100 %] 100 % (12/28 0421) Weight:  [126.1 kg] 126.1 kg (12/27 2255)  Physical Exam:  General: alert, cooperative, and no distress Lochia: appropriate Uterine Fundus: firm Incision: none DVT Evaluation: No evidence of DVT seen on physical exam. Negative Homan's sign.  Results for orders placed or performed during the hospital encounter of 12/27/20  Comprehensive metabolic panel  Result Value Ref Range   Sodium 135 135 - 145 mmol/L   Potassium 4.1 3.5 - 5.1 mmol/L   Chloride 107 98 - 111 mmol/L   CO2 23 22 - 32 mmol/L   Glucose, Bld 106 (H) 70 - 99 mg/dL   BUN 10 6 - 20 mg/dL   Creatinine, Ser 9.48 0.44 - 1.00 mg/dL   Calcium 8.9 8.9 - 54.6 mg/dL   Total Protein 6.5 6.5 - 8.1 g/dL   Albumin 3.1 (L) 3.5 - 5.0 g/dL   AST 36 15 - 41 U/L   ALT 42 0 - 44 U/L   Alkaline Phosphatase 91 38 - 126 U/L   Total Bilirubin 0.5 0.3 - 1.2 mg/dL   GFR, Estimated >27 >03 mL/min   Anion gap 5 5 - 15  CBC  Result Value Ref Range   WBC 15.4 (H) 4.0 - 10.5 K/uL   RBC 2.58 (L) 3.87 - 5.11 MIL/uL   Hemoglobin 8.3 (L) 12.0 - 15.0 g/dL   HCT 50.0 (L) 93.8 - 18.2 %   MCV 96.9 80.0 - 100.0 fL   MCH 32.2 26.0 - 34.0 pg   MCHC 33.2 30.0 - 36.0 g/dL   RDW 99.3 71.6 - 96.7 %   Platelets 242 150 - 400 K/uL   nRBC 0.2 0.0 - 0.2 %     Assessment/Plan: Postpartum Preeclampsia    Treated w magnesium pre/post delivery 4 days ago Chronic HTN    Regimen prior to pregnancy was Labetalol 100 BID and Norvasc 5 mg daily S/p Twin delivery, infants  doing well (at home) Anemia, cont to take iron and PNV  Labetalol has been transitioned to 400 mg BID (first dose at this level was yesterday evening) and Procardia XL 60 mg (first dose last night late) Will plan to trend BPs as we have been doing since admission.  BPs have been consistently in the 150s/90s, and no higher BPs (severe range, at risk to pt) have been seen here (they were reported as such with home BP monitoring prior to re-admission).  Will give time for new meds/doses to control BP ranges, then revert back to home management.  Pt understands the rationale and the wait for this.  Risks of severe range BPs to herself discussed.    LOS: 0 days   Letitia Libra [facility] 12/28/2020, 7:26 AM

## 2020-12-29 ENCOUNTER — Other Ambulatory Visit: Payer: Self-pay

## 2020-12-29 ENCOUNTER — Other Ambulatory Visit: Payer: Managed Care, Other (non HMO)

## 2020-12-29 ENCOUNTER — Encounter: Payer: Managed Care, Other (non HMO) | Admitting: Obstetrics & Gynecology

## 2020-12-29 ENCOUNTER — Encounter: Payer: Self-pay | Admitting: Obstetrics & Gynecology

## 2020-12-29 ENCOUNTER — Ambulatory Visit (INDEPENDENT_AMBULATORY_CARE_PROVIDER_SITE_OTHER): Payer: Managed Care, Other (non HMO) | Admitting: Obstetrics & Gynecology

## 2020-12-29 VITALS — BP 140/92 | Ht 71.0 in | Wt 256.0 lb

## 2020-12-29 DIAGNOSIS — O1495 Unspecified pre-eclampsia, complicating the puerperium: Secondary | ICD-10-CM

## 2020-12-29 LAB — SURGICAL PATHOLOGY

## 2020-12-29 NOTE — Progress Notes (Signed)
Obstetrics & Gynecology Office Visit   Chief Complaint  Patient presents with   Blood Pressure Check    History of Present Illness: 30 y.o. [id number] being seen for follow up blood pressure check today.  The patient is  postpartum The established diagnosis for the patient is preeclampsia with severe features.  She is currently on nifedipine ER 60mg  and Labetalol 400 mg BID.  She was on Labetalol 100 mg BID and Norvasc 5 mg daily prior to pregnancy (cHTN).  She reports no current symptoms attributable to her blood pressure.  Medication list reviewed medications which may contribute to BP elevation were not noted.  She had twins 6 days ago.  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   Hepatitis    middle school age   Hypertension    IDA (iron deficiency anemia) 04/14/2019   Obesity (BMI 30.0-34.9) 04/14/2019   UTI (urinary tract infection)     Past Surgical History:  Past Surgical History:  Procedure Laterality Date   GALLBLADDER SURGERY  2021   TONSILLECTOMY  age 86   UMBILICAL HERNIA REPAIR N/A 11/24/2019   Procedure: HERNIA REPAIR UMBILICAL ADULT, open;  Surgeon: 11/26/2019, MD;  Location: [facility] ORS;  Service: General;  Laterality: N/A;   VAGINAL DELIVERY  12/23/2020   Procedure: VAGINAL DELIVERY;  Surgeon: 12/25/2020, MD;  Location: [facility] ORS;  Service: Obstetrics;;  Twin Delivery in OR. Procedure 1: Baby A   VAGINAL DELIVERY  12/23/2020   Procedure: VAGINAL DELIVERY;  Surgeon: 12/25/2020, MD;  Location: [facility] ORS;  Service: Obstetrics;;  Twin Delivery in OR. Procedure 2: Baby B    Gynecologic History: No LMP recorded.  Obstetric History: G1P0102  Family History:  Family History  Problem Relation Age of Onset   Hypertension Mother    Depression Mother    Hypertension Maternal Grandmother    Arthritis Maternal Grandmother    Diabetes Paternal  Grandmother    Hypertension Paternal Grandmother    Hyperlipidemia Paternal Grandmother    Hypertension Paternal Grandfather     Social History:  Social History   Socioeconomic History   Marital status: Married    Spouse name: Not on file   Number of children: Not on file   Years of education: Not on file   Highest education level: Not on file  Occupational History   Not on file  Tobacco Use   Smoking status: Never   Smokeless tobacco: Never  Vaping Use   Vaping Use: Never used  Substance and Sexual Activity   Alcohol use: Not Currently    Comment: occassional   Drug use: No   Sexual activity: Yes    Birth control/protection: None    Comment: undecided  Other Topics Concern   Not on file  Social History Narrative   Not on file   Social Determinants of Health   Financial Resource Strain: Not on file  Food Insecurity: Not on file  Transportation Needs: Not on file  Physical Activity: Not on file  Stress: Not on file  Social Connections: Not on file  Intimate Partner Violence: Not on file    Allergies:  No Known Allergies  Medications: Prior to Admission medications   Medication Sig Start Date End Date Taking? Authorizing Provider  acetaminophen (TYLENOL) 325 MG tablet Take 650 mg by mouth every 6 (six) hours as needed for headache.  [provider]  buPROPion (WELLBUTRIN XL) 300 MG 24 hr tablet TAKE 1 TABLET BY MOUTH EVERY DAY 06/30/20   Irish Lack, FNP  ibuprofen (ADVIL) 600 MG tablet Take 1 tablet (600 mg total) by mouth every 6 (six) hours. 12/26/20   Tresea Mall, CNM  Iron-Vitamin C (VITRON-C) 65-125 MG TABS Take 1 tablet by mouth at bedtime.    [provider]  labetalol (NORMODYNE) 200 MG tablet Take 2 tablets (400 mg total) by mouth 2 (two) times daily. 12/29/20   Mirna Mires, CNM  NIFEdipine (ADALAT CC) 60 MG 24 hr tablet Take 1 tablet (60 mg total) by mouth daily. 12/28/20   Mirna Mires, CNM  Prenatal Vit-Fe  Fumarate-FA (PRENATAL MULTIVITAMIN) TABS tablet Take 1 tablet by mouth daily at 12 noon.    [provider]    ROS  Physical Exam Blood pressure (!) 140/92, height 5\' 11"  (1.803 m), weight 256 lb (116.1 kg), currently breastfeeding.  No LMP recorded.  General: NAD HEENT: normocephalic, anicteric Pulmonary: No increased work of breathing Cardiovascular: RRR, distal pulses 2+ Extremities: 1+edema, no erythema, no tenderness Neurologic: Grossly intact Psychiatric: mood appropriate, affect full  Assessment: 30 y.o. 26 presenting for blood pressure evaluation today  Plan: Problem List Items Addressed This Visit       Cardiovascular and Mediastinum   Preeclampsia in postpartum period - Primary    1) Blood pressure - blood pressure at today's visit is elevated.  However it is mild and lower than at home and on admission to hospital 2 days ago.  We have checked her BP with her own cuff here today and it runs 10 points higher for both.  As a result  she will cont her medicine regimen. - additional blood work was not obtained - recheck one week - risks of preeclampsia and sx's to watch for discussed  [id number], MD, Annamarie Major Ob/Gyn, [facility] 12/29/2020  4:43 PM

## 2020-12-30 ENCOUNTER — Telehealth: Payer: Self-pay | Admitting: Lactation Services

## 2020-12-30 NOTE — Telephone Encounter (Signed)
Pt called at 0930 and lm wanting to rent a hospital grade pump, I returned her call and gave her pump rental info, she plans on coming this pm for rental, will call when on the way, delivered twins 1 wk ago.

## 2021-01-04 ENCOUNTER — Other Ambulatory Visit: Payer: Self-pay | Admitting: Obstetrics

## 2021-01-05 ENCOUNTER — Encounter: Payer: Self-pay | Admitting: Obstetrics & Gynecology

## 2021-01-05 ENCOUNTER — Other Ambulatory Visit: Payer: Self-pay

## 2021-01-05 ENCOUNTER — Encounter: Payer: Managed Care, Other (non HMO) | Admitting: Obstetrics & Gynecology

## 2021-01-05 ENCOUNTER — Ambulatory Visit (INDEPENDENT_AMBULATORY_CARE_PROVIDER_SITE_OTHER): Payer: Managed Care, Other (non HMO) | Admitting: Obstetrics & Gynecology

## 2021-01-05 VITALS — BP 120/80 | Ht 71.0 in | Wt 256.0 lb

## 2021-01-05 DIAGNOSIS — O1495 Unspecified pre-eclampsia, complicating the puerperium: Secondary | ICD-10-CM

## 2021-01-05 NOTE — Progress Notes (Signed)
Obstetrics & Gynecology Office Visit   Chief Complaint  Patient presents with   Blood Pressure Check   History of Present Illness: 31 y.o. [id number] being seen for follow up blood pressure check today.  Much improved blood pressures at home.  No concerning sx's.  No side effects.  Likes procardia more so than Norvasc.  Past Medical History:  Past Medical History:  Diagnosis Date   Depression    Depression, major, single episode, complete remission ([place]) 04/14/2019   Hepatitis    middle school age   Hypertension    IDA (iron deficiency anemia) 04/14/2019   Obesity (BMI 30.0-34.9) 04/14/2019   UTI (urinary tract infection)    Past Surgical History:  Past Surgical History:  Procedure Laterality Date   GALLBLADDER SURGERY  2021   TONSILLECTOMY  age 31   UMBILICAL HERNIA REPAIR N/A 11/24/2019   Procedure: HERNIA REPAIR UMBILICAL ADULT, open;  Surgeon: Olean Ree, MD;  Location: [facility] ORS;  Service: General;  Laterality: N/A;   VAGINAL DELIVERY  12/23/2020   Procedure: VAGINAL DELIVERY;  Surgeon: Gae Dry, MD;  Location: [facility] ORS;  Service: Obstetrics;;  Twin Delivery in OR. Procedure 1: Baby A   VAGINAL DELIVERY  12/23/2020   Procedure: VAGINAL DELIVERY;  Surgeon: Gae Dry, MD;  Location: [facility] ORS;  Service: Obstetrics;;  Twin Delivery in OR. Procedure 2: Baby B    Gynecologic History: No LMP recorded.  Obstetric History: G1P0102  Family History:  Family History  Problem Relation Age of Onset   Hypertension Mother    Depression Mother    Hypertension Maternal Grandmother    Arthritis Maternal Grandmother    Diabetes Paternal Grandmother    Hypertension Paternal Grandmother    Hyperlipidemia Paternal Grandmother    Hypertension Paternal Grandfather     Social History:  Social History   Socioeconomic History   Marital status: Married    Spouse name: Not on file   Number of children: Not on file   Years of education: Not on file   Highest  education level: Not on file  Occupational History   Not on file  Tobacco Use   Smoking status: Never   Smokeless tobacco: Never  Vaping Use   Vaping Use: Never used  Substance and Sexual Activity   Alcohol use: Not Currently    Comment: occassional   Drug use: No   Sexual activity: Yes    Birth control/protection: None    Comment: undecided  Other Topics Concern   Not on file  Social History Narrative   Not on file   Social Determinants of Health   Financial Resource Strain: Not on file  Food Insecurity: Not on file  Transportation Needs: Not on file  Physical Activity: Not on file  Stress: Not on file  Social Connections: Not on file  Intimate Partner Violence: Not on file    Allergies:  No Known Allergies  Medications: Prior to Admission medications   Medication Sig Start Date End Date Taking? Authorizing Provider  acetaminophen (TYLENOL) 325 MG tablet Take 650 mg by mouth every 6 (six) hours as needed for headache.   Yes [provider]  buPROPion (WELLBUTRIN XL) 300 MG 24 hr tablet TAKE 1 TABLET BY MOUTH EVERY DAY 06/30/20  Yes Beckie Salts, FNP  ibuprofen (ADVIL) 600 MG tablet Take 1 tablet (600 mg total) by mouth every 6 (six) hours. 12/26/20  Yes Rod Can, CNM  Iron-Vitamin C (VITRON-C) 65-125 MG TABS Take 1  tablet by mouth at bedtime.   Yes [provider]  labetalol (NORMODYNE) 200 MG tablet TAKE 2 TABLETS BY MOUTH 2 TIMES DAILY. 01/04/21  Yes Imagene Riches, CNM  NIFEdipine (ADALAT CC) 60 MG 24 hr tablet Take 1 tablet (60 mg total) by mouth daily. 12/28/20  Yes Imagene Riches, CNM  Prenatal Vit-Fe Fumarate-FA (PRENATAL MULTIVITAMIN) TABS tablet Take 1 tablet by mouth daily at 12 noon.   Yes [provider]    Review of Systems  All other systems reviewed and are negative.  Physical Exam Blood pressure 120/80, height 5\' 11"  (1.803 m), weight 256 lb (116.1 kg), currently breastfeeding.  No LMP recorded.  General:  NAD HEENT: normocephalic, anicteric Pulmonary: No increased work of breathing Cardiovascular: RRR, distal pulses 2+ Extremities: noedema, no erythema, no tenderness Neurologic: Grossly intact Psychiatric: mood appropriate, affect full  Assessment: 31 y.o. [id number] presenting for blood pressure evaluation today  Plan: Problem List Items Addressed This Visit       Cardiovascular and Mediastinum   Preeclampsia in postpartum period - Primary    1) Blood pressure - blood pressure at today's visit is normotensive.  As a result will continue current regimen and consider taper down to pre-pregnancy dosing at 6 weeks or sooner if persistent normal/low normal blood pressures or sx's develop.  Pt prefers to end with Procardia over Norvasc if safe for her.  Also, Labetalol was her other medicine pre-pregnancy.    Plans Paraguard for contraception  Barnett Applebaum, MD, [facility], [facility] 01/05/2021  3:00 PM

## 2021-01-13 ENCOUNTER — Encounter: Payer: Managed Care, Other (non HMO) | Admitting: Obstetrics and Gynecology

## 2021-01-13 ENCOUNTER — Other Ambulatory Visit: Payer: Self-pay | Admitting: Obstetrics & Gynecology

## 2021-01-13 ENCOUNTER — Encounter: Payer: Self-pay | Admitting: Obstetrics & Gynecology

## 2021-01-13 MED ORDER — NIFEDIPINE ER OSMOTIC RELEASE 30 MG PO TB24: 30.0000 mg | ORAL_TABLET | Freq: Every day | ORAL | 3 refills | Status: DC

## 2021-01-13 MED ORDER — LABETALOL HCL 100 MG PO TABS: 100.0000 mg | ORAL_TABLET | Freq: Two times a day (BID) | ORAL | 3 refills | Status: DC

## 2021-01-19 ENCOUNTER — Other Ambulatory Visit: Payer: Self-pay | Admitting: Obstetrics

## 2021-01-19 ENCOUNTER — Telehealth: Payer: Self-pay | Admitting: Lactation Services

## 2021-01-19 NOTE — Telephone Encounter (Signed)
Samantha Glass called lactation re: pumping questions/concerns. Samantha Glass is exclusively pumping for her now 1 week old twin girls. She reports that her supply is adequate but she is starting to develop some pain/discomfort and the development of a tender nipple on the L breast. She has noticed the nipple to have a white ring around the base and swollen mainly after pumping, and feels that her milk is not fully emptying. She currently is using a size 55mm flange that she started with while she was in the hospital. LC recommended increasing the flange size on that side to size 28mm, continued use of warmth and massage of full breast with some localized massage at areas that feel clogged. We also discussed possible use of sunflower lecithin as a possible avenue to help with thinning of the breastmilk to help with removal.  LC also provided mom with guidance of early mastitis signs and symptoms and when to call OB for medical care.  With tips given today mom has declined an outpatient appointment but knows that we can have one if she feels things has not resolved.

## 2021-02-02 ENCOUNTER — Encounter: Payer: Self-pay | Admitting: Obstetrics & Gynecology

## 2021-02-02 ENCOUNTER — Other Ambulatory Visit: Payer: Self-pay

## 2021-02-02 ENCOUNTER — Ambulatory Visit (INDEPENDENT_AMBULATORY_CARE_PROVIDER_SITE_OTHER): Payer: Managed Care, Other (non HMO) | Admitting: Obstetrics & Gynecology

## 2021-02-02 ENCOUNTER — Other Ambulatory Visit (HOSPITAL_COMMUNITY)
Admission: RE | Admit: 2021-02-02 | Discharge: 2021-02-02 | Disposition: A | Discharge status: Home / Self Care | Payer: Managed Care, Other (non HMO) | Source: Ambulatory Visit | Attending: Obstetrics & Gynecology | Admitting: Obstetrics & Gynecology

## 2021-02-02 DIAGNOSIS — Z124 Encounter for screening for malignant neoplasm of cervix: Secondary | ICD-10-CM

## 2021-02-02 DIAGNOSIS — Z30430 Encounter for insertion of intrauterine contraceptive device: Secondary | ICD-10-CM

## 2021-02-02 NOTE — Progress Notes (Signed)
°  OBSTETRICS POSTPARTUM CLINIC PROGRESS NOTE  Subjective:     Samantha Glass is a 31 y.o. G69P0102 female who presents for a postpartum visit. She is 6 weeks postpartum following a Preterm pregnancy <37 weeks of Multifetal pregnancy and delivery by Vaginal, no problems at delivery.  I have fully reviewed the prenatal and intrapartum course.  He has cHTN and has been on meds for this. Anesthesia: epidural.  Postpartum course has been complicated by uncomplicated.  Baby is feeding by Bottle and Breast.  Bleeding: patient has not  resumed menses.  Bowel function is normal. Bladder function is normal.  Patient is not sexually active. Contraception method desired is IUD.  Postpartum depression screening: negative. [place] 6.  The following portions of the patient's history were reviewed and updated as appropriate: allergies, current medications, past family history, past medical history, past social history, past surgical history, and problem list.  Review of Systems Pertinent items are noted in HPI.  Objective:    BP 120/80    Ht 5\' 11"  (1.803 m)    Wt 254 lb (115.2 kg)    Breastfeeding Yes    BMI 35.43 kg/m   General:  alert and no distress   Breasts:  inspection negative, no nipple discharge or bleeding, no masses or nodularity palpable  Lungs: clear to auscultation bilaterally  Heart:  regular rate and rhythm, S1, S2 normal, no murmur, click, rub or gallop  Abdomen: soft, non-tender; bowel sounds normal; no masses,  no organomegaly.     Vulva:  normal  Vagina: normal vagina, no discharge, exudate, lesion, or erythema  Cervix:  no cervical motion tenderness and no lesions  Corpus: normal size, contour, position, consistency, mobility, non-tender  Adnexa:  normal adnexa and no mass, fullness, tenderness  Rectal Exam: Not performed.          Assessment:  Post Partum Care visit 1. Postpartum care following vaginal delivery  2. Screening for cervical cancer - Cytology -  PAP  3. Encounter for IUD insertion See below  4. cHTN Resume meds of Labetalol 100 mg BID and Procardia 30 mg daily   Plan:  See orders and Patient Instructions Resume all normal activities Follow up in: 4 weeks or as needed.    IUD PROCEDURE NOTE:  QWYNN ROCHER is a 31 y.o. [id number] here for IUD insertion. No GYN concerns.  Last pap smear was normal.  IUD Insertion Procedure Note Patient identified, informed consent performed, consent signed.   Discussed risks of irregular bleeding, cramping, infection, malpositioning or misplacement of the IUD outside the uterus which may require further procedure such as laparoscopy, risk of failure <1%. Time out was performed.  Urine pregnancy test negative.  A bimanual exam showed the uterus to be midposition.  Speculum placed in the vagina.  Cervix visualized.  Cleaned with Betadine x 2.  Grasped anteriorly with a single tooth tenaculum.  Uterus sounded to 7 cm.   Paragard IUD placed per manufacturer's recommendations.  Strings trimmed to 3 cm. Tenaculum was removed, good hemostasis noted.  Patient tolerated procedure well.   Patient was given post-procedure instructions.  She was advised to have backup contraception for one week.  Patient was also asked to check IUD strings periodically and follow up in 4 weeks for IUD check.  Barnett Applebaum, MD, [facility], [facility] 02/02/2021  3:39 PM

## 2021-02-02 NOTE — Patient Instructions (Signed)
Intrauterine Device Insertion, Care After This sheet gives you information about how to care for yourself after your procedure. Your health care provider may also give you more specific instructions. If you have problems or questions, contact your health care provider. What can I expect after the procedure? After the procedure, it is common to have: Cramps and pain in the abdomen. Bleeding. It may be light or heavy. This may last for a few days. Lower back pain. Dizziness. Headaches. Nausea. Follow these instructions at home:  Before resuming sexual activity, check to make sure that you can feel the IUD string or strings. You should be able to feel the end of the string below the opening of your cervix. If your IUD string is in place, you may resume sexual activity. If you had a hormonal IUD inserted more than 7 days after your most recent period started, you will need to use a backup method of birth control for 7 days after IUD insertion. Ask your health care provider whether this applies to you. Continue to check that the IUD is still in place by feeling for the strings after every menstrual period, or once a month. An IUD will not protect you from sexually transmitted infections (STIs). Use methods to prevent the exchange of body fluids between partners (barrier protection) every time you have sex. Barrier protection can be used during oral, vaginal, or anal sex. Commonly used barrier methods include: Female condom. Female condom. Dental dam. Take over-the-counter and prescription medicines only as told by your health care provider. Keep all follow-up visits as told by your health care provider. This is important. Contact a health care provider if: You feel light-headed or weak. You have any of the following problems with your IUD string or strings: The string bothers or hurts you or your sexual partner. You cannot feel the string. The string has gotten longer. You can feel the IUD in  your vagina. You think you may be pregnant, or you miss your menstrual period. You think you may have a sexually transmitted infection (STI). Get help right away if: You have flu-like symptoms, such as tiredness (fatigue) and muscle aches. You have a fever and chills. You have bleeding that is heavier or lasts longer than a normal menstrual cycle. You have abnormal or bad-smelling discharge from your vagina. You develop abdominal pain that is new, is getting worse, or is not in the same area of earlier cramping and pain. You have pain during sexual activity. Summary After the procedure, it is common to have cramps and pain in the abdomen. It is also common to have light bleeding or heavier bleeding that is like your menstrual period. Continue to check that the IUD is still in place by feeling for the strings after every menstrual period, or once a month. Keep all follow-up visits as told by your health care provider. This is important. Contact your health care provider if you have problems with your IUD strings, such as the string getting longer or bothering you or your sexual partner. This information is not intended to replace advice given to you by your health care provider. Make sure you discuss any questions you have with your health care provider. Document Revised: 12/09/2018 Document Reviewed: 12/09/2018 Elsevier Patient Education  2022 Elsevier Inc.  

## 2021-02-07 LAB — CYTOLOGY - PAP
Comment: NEGATIVE
Diagnosis: NEGATIVE
High risk HPV: NEGATIVE

## 2021-03-06 ENCOUNTER — Encounter: Payer: Self-pay | Admitting: Obstetrics & Gynecology

## 2021-03-06 ENCOUNTER — Ambulatory Visit: Payer: Managed Care, Other (non HMO) | Admitting: Obstetrics & Gynecology

## 2021-03-06 ENCOUNTER — Other Ambulatory Visit: Payer: Self-pay

## 2021-03-06 VITALS — BP 130/90 | Ht 71.0 in | Wt 253.0 lb

## 2021-03-06 DIAGNOSIS — Z30431 Encounter for routine checking of intrauterine contraceptive device: Secondary | ICD-10-CM

## 2021-03-06 NOTE — Progress Notes (Signed)
?  History of Present Illness:  Samantha Glass is a 31 y.o. that had a Paragard IUD placed approximately 1 month ago. Since that time, she states that she has had no bleeding and pain ? ?PMHx: ?She  has a past medical history of Depression, Depression, major, single episode, complete remission ([facility]) (04/14/2019), Hepatitis, Hypertension, IDA (iron deficiency anemia) (04/14/2019), Obesity (BMI 30.0-34.9) (04/14/2019), and UTI (urinary tract infection). Also,  has a past surgical history that includes Tonsillectomy (age 66); Umbilical hernia repair (N/A, 11/24/2019); Gallbladder surgery (2021); Vaginal delivery (12/23/2020); and Vaginal delivery (12/23/2020)., family history includes Arthritis in her maternal grandmother; Depression in her mother; Diabetes in her paternal grandmother; Hyperlipidemia in her paternal grandmother; Hypertension in her maternal grandmother, mother, paternal grandfather, and paternal grandmother.,  reports that she has never smoked. She has never used smokeless tobacco. She reports that she does not currently use alcohol. She reports that she does not use drugs. ?Current Meds  ?Medication Sig  ? acetaminophen (TYLENOL) 325 MG tablet Take 650 mg by mouth every 6 (six) hours as needed for headache.  ? buPROPion (WELLBUTRIN XL) 300 MG 24 hr tablet TAKE 1 TABLET BY MOUTH EVERY DAY  ? ibuprofen (ADVIL) 600 MG tablet Take 1 tablet (600 mg total) by mouth every 6 (six) hours.  ? Iron-Vitamin C (VITRON-C) 65-125 MG TABS Take 1 tablet by mouth at bedtime.  ? labetalol (NORMODYNE) 100 MG tablet Take 1 tablet (100 mg total) by mouth 2 (two) times daily.  ? NIFEdipine (PROCARDIA-XL/NIFEDICAL-XL) 30 MG 24 hr tablet Take 1 tablet (30 mg total) by mouth daily. Can increase to twice a day as needed for symptomatic contractions  ? Prenatal Vit-Fe Fumarate-FA (PRENATAL MULTIVITAMIN) TABS tablet Take 1 tablet by mouth daily at 12 noon.  ?.  Also, has No Known Allergies.. ? ?Review of Systems  ?All other  systems reviewed and are negative. ? ?Physical Exam:  ?BP 130/90   Ht 5\' 11"  (1.803 m)   Wt 253 lb (114.8 kg)   Breastfeeding Yes   BMI 35.29 kg/m?  Body mass index is 35.29 kg/m? ?Constitutional: Well nourished, well developed female in no acute distress.  ?Abdomen: diffusely non tender to palpation, non distended, and no masses, hernias ?Neuro: Grossly intact ?Psych:  Normal mood and affect.   ? ?Pelvic exam:  ?Two IUD strings present seen coming from the cervical os. ?EGBUS, vaginal vault and cervix: within normal limits ? ?Assessment: IUD strings present in proper location; pt doing well ? ?Plan: ?She was told to continue to use barrier contraception, in order to prevent any STIs, and to take a home pregnancy test or call [facility] if she ever thinks she may be pregnant, and that her IUD expires in 10 years. ? ?She was amenable to this plan and we will see her back in 1 year/PRN. ? ?A total of 20 minutes were spent face-to-face with the patient as well as preparation, review, communication, and documentation during this encounter.  ? ?[place], MD, FACOG ?[facility], [facility] ?03/06/2021  2:22 PM ? ?

## 2021-03-22 ENCOUNTER — Telehealth: Payer: Self-pay

## 2021-03-22 NOTE — Telephone Encounter (Signed)
Female genetic councelor from [facility] calling to see if pt at Cystic Fibrosis testing done in preg last year.  Adv she did not. ?

## 2021-04-03 ENCOUNTER — Telehealth: Payer: Self-pay

## 2021-04-03 NOTE — Telephone Encounter (Signed)
Pt calling; is ending her maternity leave and returning to work 04/10/21; her employer needs a return to work note by 04/07/21.  [phone]  Pt aware should be able to access letter in her MyChart. ?

## 2021-07-12 ENCOUNTER — Other Ambulatory Visit: Payer: Self-pay | Admitting: *Deleted

## 2021-07-21 ENCOUNTER — Ambulatory Visit: Payer: Managed Care, Other (non HMO) | Admitting: Nurse Practitioner

## 2021-07-21 ENCOUNTER — Encounter: Payer: Self-pay | Admitting: Nurse Practitioner

## 2021-07-21 VITALS — BP 127/81 | HR 87 | Ht 71.0 in | Wt 259.7 lb

## 2021-07-21 DIAGNOSIS — I10 Essential (primary) hypertension: Secondary | ICD-10-CM | POA: Diagnosis not present

## 2021-07-21 DIAGNOSIS — Z6836 Body mass index (BMI) 36.0-36.9, adult: Secondary | ICD-10-CM

## 2021-07-21 DIAGNOSIS — F32A Depression, unspecified: Secondary | ICD-10-CM

## 2021-07-21 DIAGNOSIS — E669 Obesity, unspecified: Secondary | ICD-10-CM | POA: Diagnosis not present

## 2021-07-21 MED ORDER — BUPROPION HCL ER (XL) 300 MG PO TB24: 300.0000 mg | ORAL_TABLET | Freq: Every day | ORAL | 3 refills | Status: DC

## 2021-07-21 NOTE — Progress Notes (Signed)
Established Patient Office Visit  Subjective:  Patient ID: Samantha Glass, female    DOB: 04-Feb-1990  Age: 31 y.o. MRN: [id number]  CC:  Chief Complaint  Patient presents with   Medication Refill     HPI  Samantha Glass present for routine visit. She has history of BP and preeclampsia post partum.  Patient states that she is not taking any blood pressure medication.  She checks her BP at home and is 120's/ 80"s. She is taking Wellbutrin XL 300 mg and iron supplements.  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   Hepatitis    middle school age   Hypertension    IDA (iron deficiency anemia) 04/14/2019   Obesity (BMI 30.0-34.9) 04/14/2019   UTI (urinary tract infection)     Past Surgical History:  Procedure Laterality Date   GALLBLADDER SURGERY  2021   TONSILLECTOMY  age 29   UMBILICAL HERNIA REPAIR N/A 11/24/2019   Procedure: HERNIA REPAIR UMBILICAL ADULT, open;  Surgeon: Henrene Dodge, MD;  Location: [facility] ORS;  Service: General;  Laterality: N/A;   VAGINAL DELIVERY  12/23/2020   Procedure: VAGINAL DELIVERY;  Surgeon: Nadara Mustard, MD;  Location: [facility] OR[facility];  Service: Obstetrics;;  Twin Delivery in OR. Procedure 1: Baby A   VAGINAL DELIVERY  12/23/2020   Procedure: VAGINAL DELIVERY;  Surgeon: Nadara Mustard, MD;  Location: [facility];  Service: Obstetrics;;  Twin Delivery in OR. Procedure 2: Baby B    Family History  Problem Relation Age of Onset   Hypertension Mother    Depression Mother    Hypertension Maternal Grandmother    Arthritis Maternal Grandmother    Diabetes Paternal Grandmother    Hypertension Paternal Grandmother    Hyperlipidemia Paternal Grandmother    Hypertension Paternal Grandfather     Social History   Socioeconomic History   Marital status: Married    Spouse name: Not on file   Number of children: Not on file   Years of education: Not on file    Highest education level: Not on file  Occupational History   Not on file  Tobacco Use   Smoking status: Never   Smokeless tobacco: Never  Vaping Use   Vaping Use: Never used  Substance and Sexual Activity   Alcohol use: Not Currently    Comment: occassional   Drug use: No   Sexual activity: Yes    Birth control/protection: None    Comment: undecided  Other Topics Concern   Not on file  Social History Narrative   Not on file   Social Determinants of Health   Financial Resource Strain: Not on file  Food Insecurity: Not on file  Transportation Needs: Not on file  Physical Activity: Not on file  Stress: Not on file  Social Connections: Not on file  Intimate Partner Violence: Not on file     Outpatient Medications Prior to Visit  Medication Sig Dispense Refill   Iron-Vitamin C (VITRON-C) 65-125 MG TABS Take 1 tablet by mouth at bedtime.     Prenatal Vit-Fe Fumarate-FA (PRENATAL MULTIVITAMIN) TABS tablet Take 1 tablet by mouth daily at 12 noon.     buPROPion (WELLBUTRIN XL) 300 MG 24 hr tablet TAKE 1 TABLET BY MOUTH EVERY DAY 90 tablet 3   acetaminophen (TYLENOL) 325 MG tablet Take 650 mg by mouth every 6 (six) hours as needed for headache.  ibuprofen (ADVIL) 600 MG tablet Take 1 tablet (600 mg total) by mouth every 6 (six) hours. 30 tablet 0   labetalol (NORMODYNE) 100 MG tablet Take 1 tablet (100 mg total) by mouth 2 (two) times daily. 180 tablet 3   NIFEdipine (PROCARDIA-XL/NIFEDICAL-XL) 30 MG 24 hr tablet Take 1 tablet (30 mg total) by mouth daily. Can increase to twice a day as needed for symptomatic contractions 90 tablet 3   Facility-Administered Medications Prior to Visit  Medication Dose Route Frequency Provider Last Rate Last Admin   buPROPion (WELLBUTRIN XL) 24 hr tablet 300 mg  300 mg Oral Daily Nadara Mustard, MD        No Known Allergies  ROS Review of Systems  Constitutional:  Negative for activity change and fatigue.  HENT:  Negative for  congestion, ear discharge and sinus pain.   Eyes: Negative.   Respiratory:  Negative for apnea, choking and shortness of breath.   Cardiovascular:  Negative for chest pain and palpitations.  Gastrointestinal:  Negative for abdominal distention, blood in stool and rectal pain.  Genitourinary: Negative.   Musculoskeletal: Negative.   Skin: Negative.   Neurological:  Negative for dizziness, facial asymmetry and headaches.  Hematological: Negative.   Psychiatric/Behavioral:  Negative for agitation, behavioral problems and confusion.       Objective:    Physical Exam Constitutional:      Appearance: Normal appearance. She is obese.  HENT:     Head: Normocephalic and atraumatic.     Right Ear: Tympanic membrane normal.     Left Ear: Tympanic membrane normal.     Nose: Nose normal.     Mouth/Throat:     Mouth: Mucous membranes are moist.     Pharynx: Oropharynx is clear.  Eyes:     Conjunctiva/sclera: Conjunctivae normal.     Pupils: Pupils are equal, round, and reactive to light.  Cardiovascular:     Rate and Rhythm: Normal rate and regular rhythm.     Pulses: Normal pulses.     Heart sounds: Normal heart sounds.  Pulmonary:     Effort: Pulmonary effort is normal.     Breath sounds: Normal breath sounds.  Abdominal:     General: Bowel sounds are normal.     Palpations: Abdomen is soft.  Musculoskeletal:        General: No tenderness or signs of injury. Normal range of motion.     Cervical back: Neck supple.  Skin:    General: Skin is warm.  Neurological:     General: No focal deficit present.     Mental Status: She is alert and oriented to person, place, and time. Mental status is at baseline.  Psychiatric:        Mood and Affect: Mood normal.        Behavior: Behavior normal.        Thought Content: Thought content normal.        Judgment: Judgment normal.     BP 127/81   Pulse 87   Ht 5\' 11"  (1.803 m)   Wt 259 lb 11.2 oz (117.8 kg)   BMI 36.22 kg/m  Wt  Readings from Last 3 Encounters:  07/21/21 259 lb 11.2 oz (117.8 kg)  03/06/21 253 lb (114.8 kg)  02/02/21 254 lb (115.2 kg)     Health Maintenance Due  Topic Date Due   TETANUS/TDAP  06/15/2018    There are no preventive care reminders to display for this patient.  Lab Results  Component Value Date   TSH 1.89 03/04/2020   Lab Results  Component Value Date   WBC 15.4 (H) 12/28/2020   HGB 8.3 (L) 12/28/2020   HCT 25.0 (L) 12/28/2020   MCV 96.9 12/28/2020   PLT 242 12/28/2020   Lab Results  Component Value Date   NA 135 12/28/2020   K 4.1 12/28/2020   CO2 23 12/28/2020   GLUCOSE 106 (H) 12/28/2020   BUN 10 12/28/2020   CREATININE 0.74 12/28/2020   BILITOT 0.5 12/28/2020   ALKPHOS 91 12/28/2020   AST 36 12/28/2020   ALT 42 12/28/2020   PROT 6.5 12/28/2020   ALBUMIN 3.1 (L) 12/28/2020   CALCIUM 8.9 12/28/2020   ANIONGAP 5 12/28/2020   EGFR 123 06/21/2020   GFR 109.06 04/14/2019   Lab Results  Component Value Date   CHOL 149 04/14/2019   Lab Results  Component Value Date   HDL 58.30 04/14/2019   Lab Results  Component Value Date   LDLCALC 71 04/14/2019   Lab Results  Component Value Date   TRIG 98.0 04/14/2019   Lab Results  Component Value Date   CHOLHDL 3 04/14/2019   Lab Results  Component Value Date   HGBA1C 5.4 06/21/2020      Assessment & Plan:   Problem List Items Addressed This Visit       Cardiovascular and Mediastinum   Essential hypertension - Primary    Blood pressure 127/81 in the office today. Patient is not taking any blood pressure medication. She is using lifestyle intervention to control the blood pressure. Advised patient to check blood pressure at home and bring the readings to the next office visit. We will continue to monitor.        Other   Class 2 obesity without serious comorbidity with body mass index (BMI) of 36.0 to 36.9 in adult    Body mass index is 36.22 kg/m. Advised pt to lose weight. Advised  patient to avoid trans fat, fatty and fried food. Follow a regular physical activity schedule. Went over the risk of chronic diseases with increased weight.           Depression    Stable with medication. Continue Wellbutrin XL 300 mg once a day      Relevant Medications   buPROPion (WELLBUTRIN XL) 300 MG 24 hr tablet     Meds ordered this encounter  Medications   buPROPion (WELLBUTRIN XL) 300 MG 24 hr tablet    Sig: Take 1 tablet (300 mg total) by mouth daily.    Dispense:  90 tablet    Refill:  3     Follow-up: Return in about 3 months (around 10/21/2021), or if symptoms worsen or fail to improve.    Kara Dies, NP

## 2021-07-25 ENCOUNTER — Encounter: Payer: Self-pay | Admitting: Nurse Practitioner

## 2021-07-25 DIAGNOSIS — F32A Depression, unspecified: Secondary | ICD-10-CM | POA: Insufficient documentation

## 2021-07-25 NOTE — Assessment & Plan Note (Signed)
Stable with medication. Continue Wellbutrin XL 300 mg once a day

## 2021-07-25 NOTE — Assessment & Plan Note (Signed)
Body mass index is 36.22 kg/m. Advised pt to lose weight. Advised patient to avoid trans fat, fatty and fried food. Follow a regular physical activity schedule. Went over the risk of chronic diseases with increased weight.

## 2021-07-25 NOTE — Assessment & Plan Note (Addendum)
Blood pressure 127/81 in the office today. Patient is not taking any blood pressure medication. She is using lifestyle intervention to control the blood pressure. Advised patient to check blood pressure at home and bring the readings to the next office visit. We will continue to monitor.

## 2021-09-21 ENCOUNTER — Ambulatory Visit: Payer: Managed Care, Other (non HMO) | Admitting: Nurse Practitioner

## 2021-10-07 IMAGING — US US ABDOMEN LIMITED
1 series · 14 of 25 positions shown · non-contrast
Comparison: None.

CLINICAL DATA: Right upper quadrant abdominal pain, nausea

EXAM:
ULTRASOUND ABDOMEN LIMITED RIGHT UPPER QUADRANT

[Series 1: us abdomen limited ruq (liver/gb) · 14 of 45 slices shown]
[im 1/45]
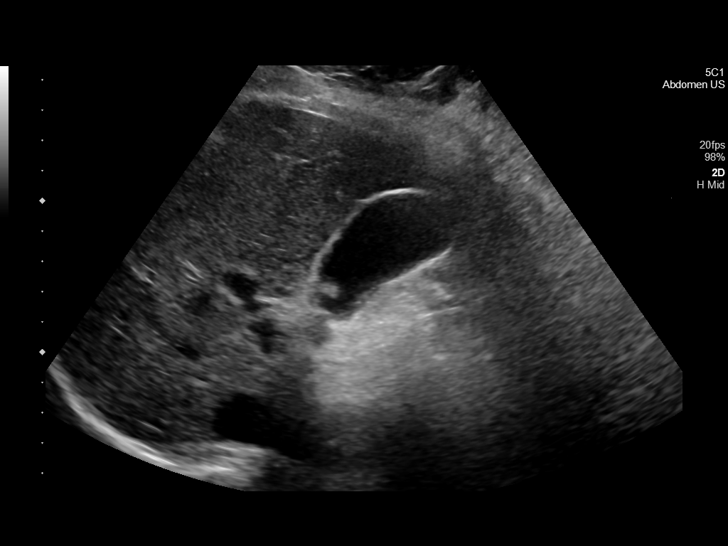
[im 4/45]
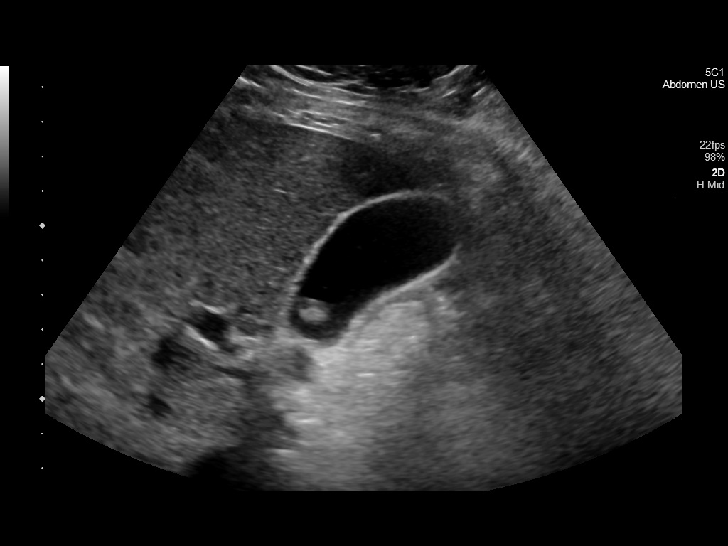
[im 8/45]
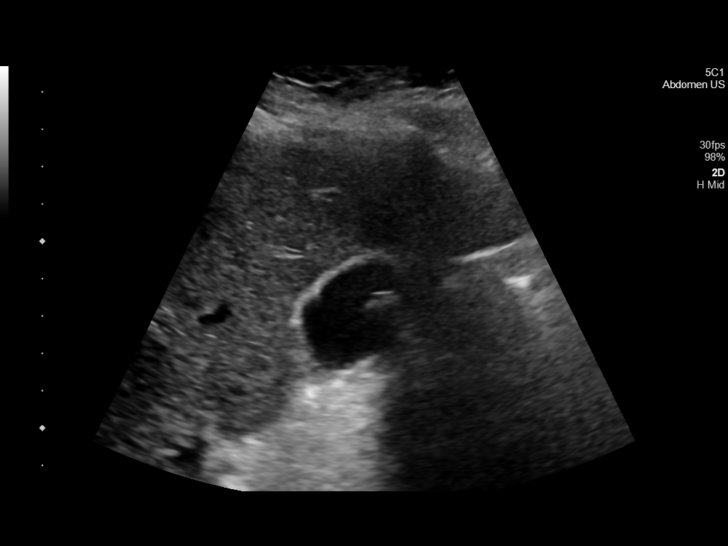
[im 12/45]
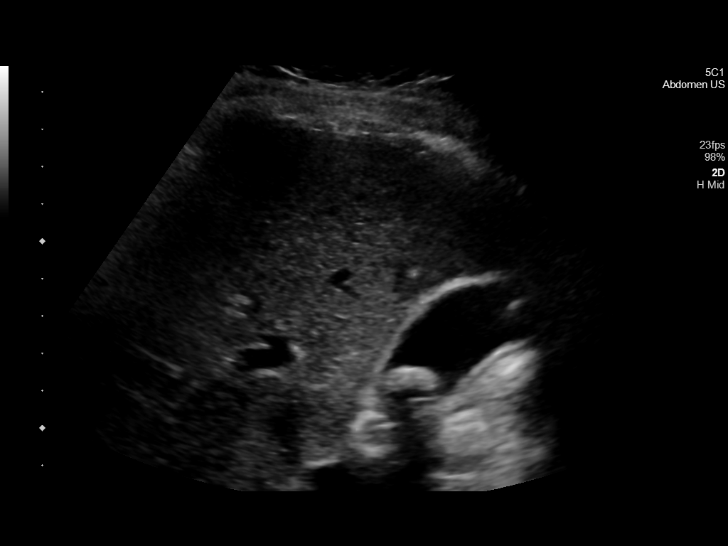
[im 15/45]
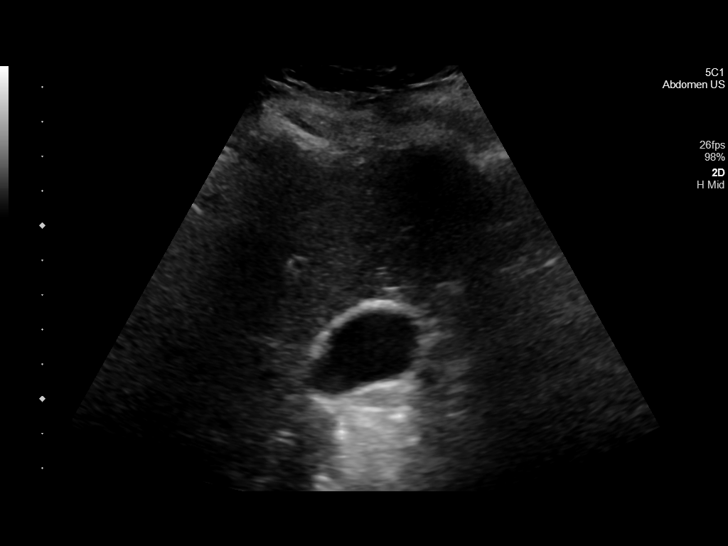
[im 17/45]
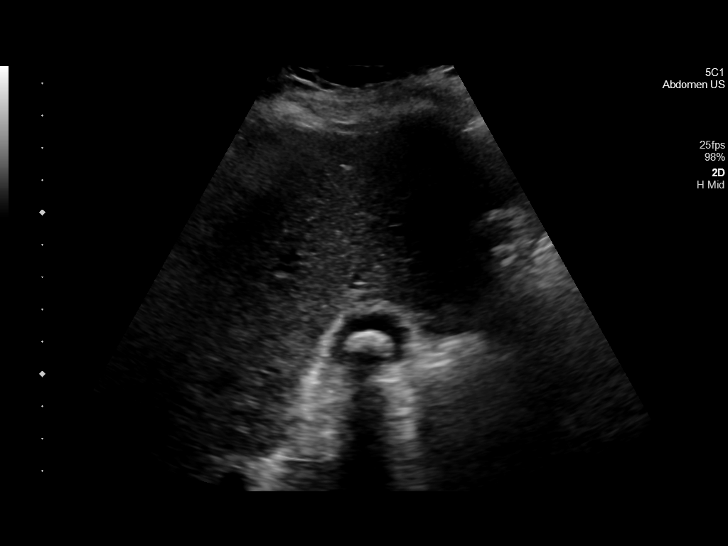
[im 21/45]
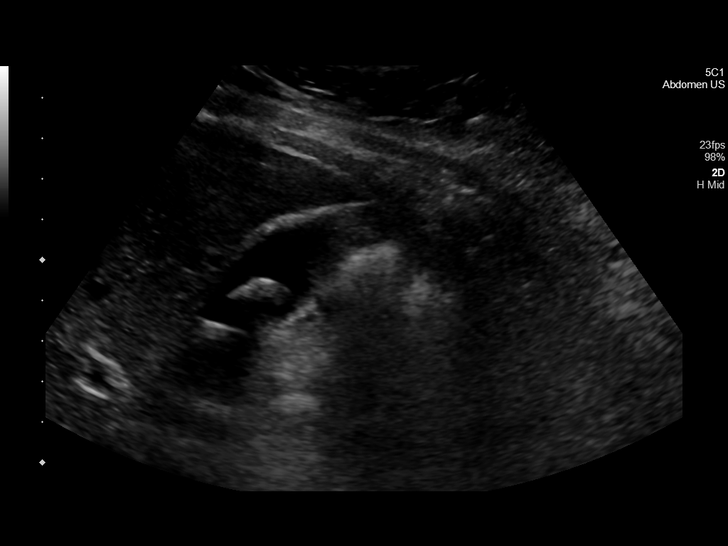
[im 24/45]
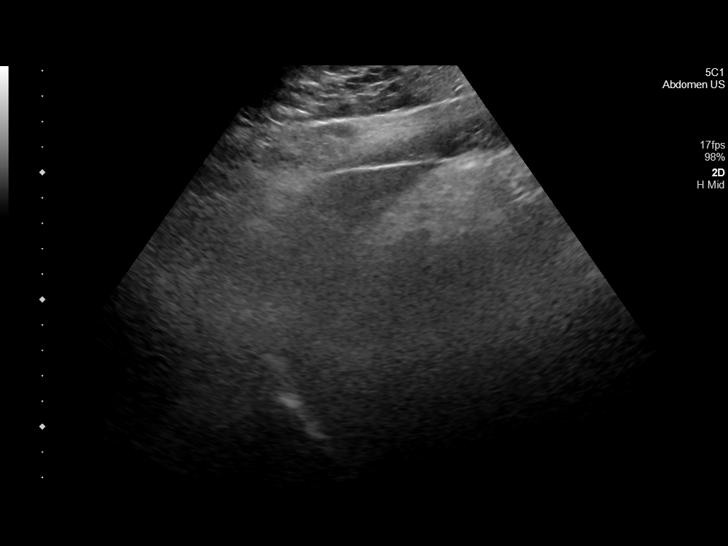
[im 28/45]
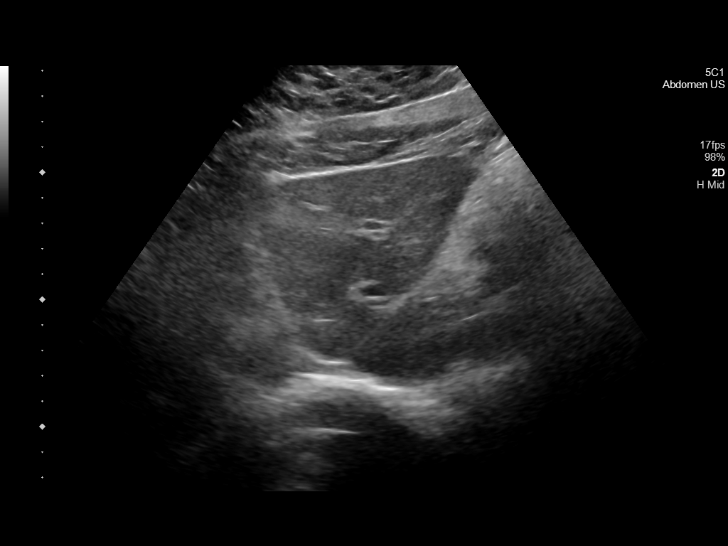
[im 30/45]
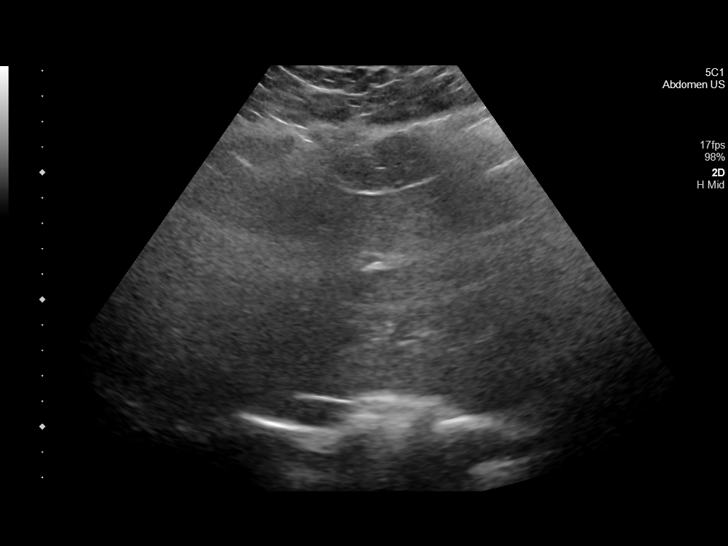
[im 34/45]
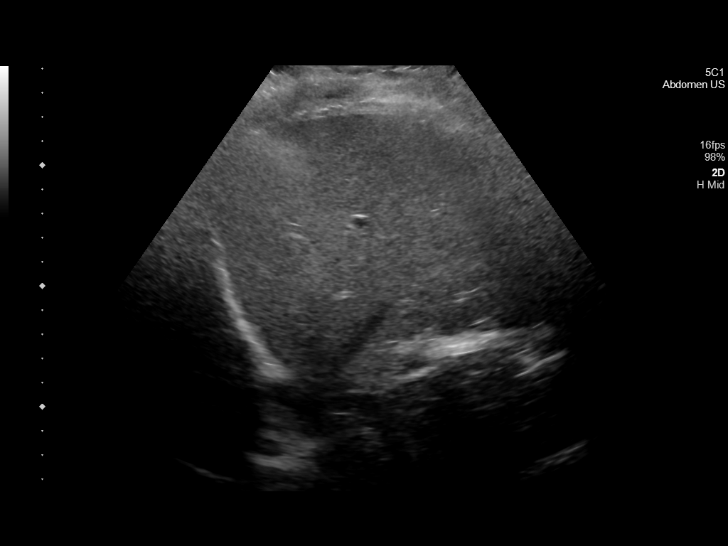
[im 37/45]
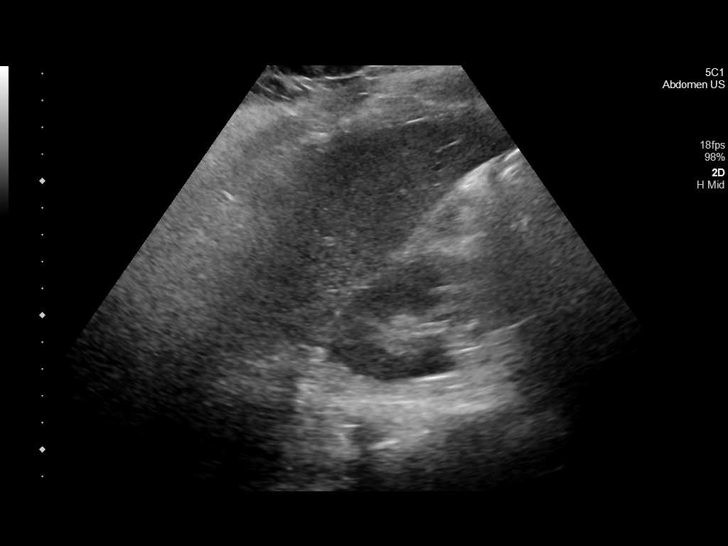
[im 41/45]
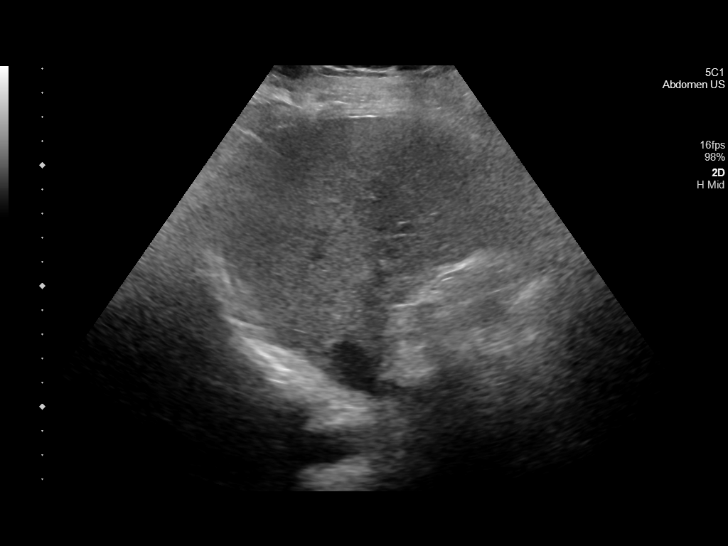
[im 45/45]
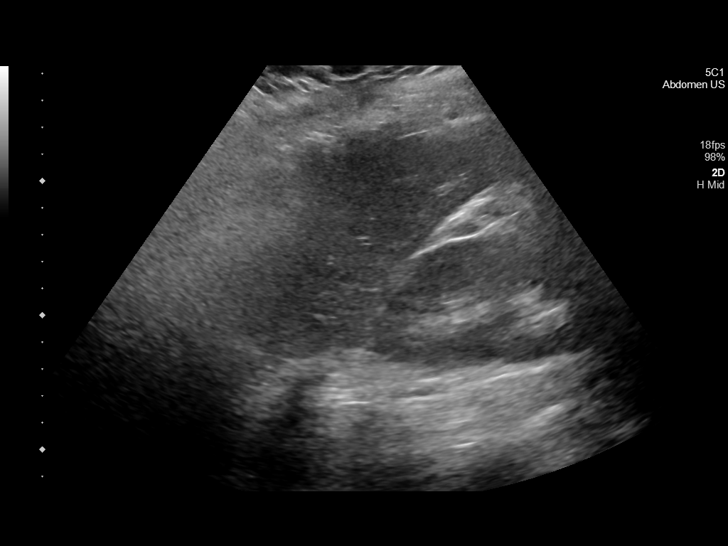

[14 of 25 positions shown; findings below may reference images not displayed]

FINDINGS: Gallbladder:

Multiple gallstones, measuring up to 14 mm. No gallbladder wall
thickening or pericholecystic fluid. Negative sonographic Murphy's
sign.

Common bile duct:

Diameter: 3 mm

Liver:

Within the upper limits of normal for parenchymal echogenicity. No
focal hepatic lesion is seen. Portal vein is patent on color Doppler
imaging with normal direction of blood flow towards the liver.

Other: None.
IMPRESSION: Cholelithiasis, without associated sonographic findings to suggest
acute cholecystitis.

## 2022-08-09 ENCOUNTER — Ambulatory Visit: Payer: Managed Care, Other (non HMO) | Admitting: Nurse Practitioner

## 2022-08-09 ENCOUNTER — Encounter: Payer: Self-pay | Admitting: Nurse Practitioner

## 2022-08-09 VITALS — BP 128/76 | HR 86 | Temp 98.4°F | Ht 71.0 in | Wt 257.4 lb

## 2022-08-09 DIAGNOSIS — I10 Essential (primary) hypertension: Secondary | ICD-10-CM | POA: Diagnosis not present

## 2022-08-09 DIAGNOSIS — F325 Major depressive disorder, single episode, in full remission: Secondary | ICD-10-CM

## 2022-08-09 MED ORDER — HYDROCHLOROTHIAZIDE 25 MG PO TABS: 25.0000 mg | ORAL_TABLET | Freq: Every day | ORAL | 2 refills | Status: DC

## 2022-08-09 MED ORDER — BUPROPION HCL ER (XL) 300 MG PO TB24: 300.0000 mg | ORAL_TABLET | Freq: Every day | ORAL | 3 refills | Status: DC

## 2022-08-09 NOTE — Assessment & Plan Note (Signed)
Stable on medication. Wellbutrin 300 mg daily.

## 2022-08-09 NOTE — Progress Notes (Signed)
Established Patient Office Visit  Subjective:  Patient ID: Samantha Glass, female    DOB: May 27, 1990  Age: 32 y.o. MRN: [id number]  CC:  Chief Complaint  Patient presents with   Medical Management of Chronic Issues    Medication refill    HPI  Samantha Glass presents for routine follow up. She has h/o hypertension, depression and IDA. She is taking hydrochlorothiazide 25 mg for BP, it was last prescribed by the telehealth through [facility]. The labs were ordered via [facility] in December 2023.   She is taking Wellbutrin daily and is not taking the Iron supplement at present.    She is overall doing well and no concerns at present.  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   Hepatitis    middle school age   Hypertension    IDA (iron deficiency anemia) 04/14/2019   Obesity (BMI 30.0-34.9) 04/14/2019   UTI (urinary tract infection)     Past Surgical History:  Procedure Laterality Date   GALLBLADDER SURGERY  2021   TONSILLECTOMY  age 81   UMBILICAL HERNIA REPAIR N/A 11/24/2019   Procedure: HERNIA REPAIR UMBILICAL ADULT, open;  Surgeon: Henrene Dodge, MD;  Location: [facility] OR[facility];  Service: General;  Laterality: N/A;   VAGINAL DELIVERY  12/23/2020   Procedure: VAGINAL DELIVERY;  Surgeon: Nadara Mustard, MD;  Location: [facility];  Service: Obstetrics;;  Twin Delivery in OR. Procedure 1: Baby A   VAGINAL DELIVERY  12/23/2020   Procedure: VAGINAL DELIVERY;  Surgeon: Nadara Mustard, MD;  Location: [facility];  Service: Obstetrics;;  Twin Delivery in OR. Procedure 2: Baby B    Family History  Problem Relation Age of Onset   Hypertension Mother    Depression Mother    Hypertension Maternal Grandmother    Arthritis Maternal Grandmother    Diabetes Paternal Grandmother    Hypertension Paternal Grandmother    Hyperlipidemia Paternal Grandmother    Hypertension Paternal Grandfather      Social History   Socioeconomic History   Marital status: Married    Spouse name: Not on file   Number of children: Not on file   Years of education: Not on file   Highest education level: Doctorate  Occupational History   Not on file  Tobacco Use   Smoking status: Never   Smokeless tobacco: Never  Vaping Use   Vaping status: Never Used  Substance and Sexual Activity   Alcohol use: Not Currently    Comment: occassional   Drug use: No   Sexual activity: Yes    Birth control/protection: None    Comment: undecided  Other Topics Concern   Not on file  Social History Narrative   Not on file   Social Determinants of Health   Financial Resource Strain: Low Risk  (08/08/2022)   Overall Financial Resource Strain (CARDIA)    Difficulty of Paying Living Expenses: Not hard at all  Food Insecurity: No Food Insecurity (08/08/2022)   Hunger Vital Sign    Worried About Running Out of Food in the Last Year: Never true    Ran Out of Food in the Last Year: Never true  Transportation Needs: No Transportation Needs (08/08/2022)   PRAPARE - Administrator, Civil Service (Medical): No    Lack of Transportation (Non-Medical): No  Physical Activity: Insufficiently Active (08/08/2022)   Exercise Vital Sign    Days  of Exercise per Week: 4 days    Minutes of Exercise per Session: 20 min  Stress: No Stress Concern Present (08/08/2022)   [employer] of Occupational Health - Occupational Stress Questionnaire    Feeling of Stress : Only a little  Social Connections: Socially Isolated (08/08/2022)   Social Connection and Isolation Panel [NHANES]    Frequency of Communication with Friends and Family: Once a week    Frequency of Social Gatherings with Friends and Family: Once a week    Attends Religious Services: Never    Database administrator or Organizations: No    Attends Engineer, structural: Not on file    Marital Status: Married  Catering manager Violence: Not on file      Outpatient Medications Prior to Visit  Medication Sig Dispense Refill   buPROPion (WELLBUTRIN XL) 300 MG 24 hr tablet Take 1 tablet (300 mg total) by mouth daily. 90 tablet 3   hydrochlorothiazide (HYDRODIURIL) 25 MG tablet Take 25 mg by mouth daily.     Iron-Vitamin C (VITRON-C) 65-125 MG TABS Take 1 tablet by mouth at bedtime.     Prenatal Vit-Fe Fumarate-FA (PRENATAL MULTIVITAMIN) TABS tablet Take 1 tablet by mouth daily at 12 noon.     Facility-Administered Medications Prior to Visit  Medication Dose Route Frequency Provider Last Rate Last Admin   buPROPion (WELLBUTRIN XL) 24 hr tablet 300 mg  300 mg Oral Daily Nadara Mustard, MD        No Known Allergies  ROS Review of Systems Negative unless indicated in HPI.    Objective:    Physical Exam Constitutional:      Appearance: Normal appearance. She is normal weight.  HENT:     Head: Normocephalic.     Right Ear: Tympanic membrane normal.     Left Ear: Tympanic membrane normal.     Mouth/Throat:     Mouth: Mucous membranes are moist.  Eyes:     Extraocular Movements: Extraocular movements intact.     Conjunctiva/sclera: Conjunctivae normal.     Pupils: Pupils are equal, round, and reactive to light.  Neck:     Thyroid: No thyroid mass or thyroid tenderness.  Cardiovascular:     Rate and Rhythm: Normal rate and regular rhythm.     Pulses: Normal pulses.     Heart sounds: Normal heart sounds. No murmur heard. Pulmonary:     Effort: Pulmonary effort is normal.     Breath sounds: Normal breath sounds.  Abdominal:     General: Bowel sounds are normal.     Palpations: Abdomen is soft. There is no mass.     Tenderness: There is no abdominal tenderness. There is no rebound.  Musculoskeletal:        General: No swelling. Normal range of motion.     Cervical back: Neck supple. No tenderness.  Skin:    Findings: No bruising, erythema or rash.  Neurological:     General: No focal deficit present.     Mental  Status: She is alert and oriented to person, place, and time. Mental status is at baseline.  Psychiatric:        Mood and Affect: Mood normal.        Behavior: Behavior normal.        Thought Content: Thought content normal.        Judgment: Judgment normal.     BP 128/76   Pulse 86   Temp 98.4 F (36.9 C) (Oral)  Ht 5\' 11"  (1.803 m)   Wt 257 lb 6.4 oz (116.8 kg)   LMP  (LMP Unknown)   SpO2 99%   Breastfeeding No   BMI 35.90 kg/m  Wt Readings from Last 3 Encounters:  08/09/22 257 lb 6.4 oz (116.8 kg)  07/21/21 259 lb 11.2 oz (117.8 kg)  03/06/21 253 lb (114.8 kg)     Health Maintenance  Topic Date Due   DTaP/Tdap/Td (3 - Tdap) 06/15/2018   COVID-19 Vaccine (5 - 2023-24 season) 09/01/2021   INFLUENZA VACCINE  04/01/2023 (Originally 08/02/2022)   PAP SMEAR-Modifier  02/03/2024   HPV VACCINES  Completed   Hepatitis C Screening  Completed   HIV Screening  Completed    There are no preventive care reminders to display for this patient.  Lab Results  Component Value Date   TSH 1.89 03/04/2020   Lab Results  Component Value Date   WBC 15.4 (H) 12/28/2020   HGB 8.3 (L) 12/28/2020   HCT 25.0 (L) 12/28/2020   MCV 96.9 12/28/2020   PLT 242 12/28/2020   Lab Results  Component Value Date   NA 135 12/28/2020   K 4.1 12/28/2020   CO2 23 12/28/2020   GLUCOSE 106 (H) 12/28/2020   BUN 10 12/28/2020   CREATININE 0.74 12/28/2020   BILITOT 0.5 12/28/2020   ALKPHOS 91 12/28/2020   AST 36 12/28/2020   ALT 42 12/28/2020   PROT 6.5 12/28/2020   ALBUMIN 3.1 (L) 12/28/2020   CALCIUM 8.9 12/28/2020   ANIONGAP 5 12/28/2020   EGFR 123 06/21/2020   GFR 109.06 04/14/2019   Lab Results  Component Value Date   CHOL 149 04/14/2019   Lab Results  Component Value Date   HDL 58.30 04/14/2019   Lab Results  Component Value Date   LDLCALC 71 04/14/2019   Lab Results  Component Value Date   TRIG 98.0 04/14/2019   Lab Results  Component Value Date   CHOLHDL 3 04/14/2019    Lab Results  Component Value Date   HGBA1C 5.4 06/21/2020      Assessment & Plan:  Essential hypertension Assessment & Plan: Patient BP  Vitals:   08/09/22 1131  BP: 128/76    in the office 08/09/22  Advised pt to follow a low sodium and heart healthy diet. Refilled hydrochlorothiazide 25 mg. Please check your blood pressure 3-4 times a week and send the numbers via Mychart in 2 weeks. Reviewed lab from December 2023. Labs within normal limit     Depression, major, single episode, complete remission (HCC) Assessment & Plan: Stable on medication. Wellbutrin 300 mg daily.   Other orders -     buPROPion HCl ER (XL); Take 1 tablet (300 mg total) by mouth daily.  Dispense: 90 tablet; Refill: 3 -     hydroCHLOROthiazide; Take 1 tablet (25 mg total) by mouth daily.  Dispense: 90 tablet; Refill: 2    Follow-up: Return for chronic management.   Kara Dies, NP

## 2022-08-09 NOTE — Assessment & Plan Note (Addendum)
Patient BP  Vitals:   08/09/22 1131  BP: 128/76    in the office 08/09/22  Advised pt to follow a low sodium and heart healthy diet. Refilled hydrochlorothiazide 25 mg. Please check your blood pressure 3-4 times a week and send the numbers via Mychart in 2 weeks. Reviewed lab from December 2023. Labs within normal limit

## 2022-08-09 NOTE — Patient Instructions (Addendum)
Please check BP 3-4 times a week at home and send the reading via my chart in 2 weeks. Refilled Wellbutrin and hydrochlorothiazide

## 2022-12-05 IMAGING — US USMFM FETAL BPP W/O NON-STRESS ADDL GEST
1 series · 14 of 28 positions shown · non-contrast
Comparison: none

[Series 1: usmfm fetal bpp w/o non-stress addl gest · 49 acquisitions, 14 frames shown]
[im 2/49]
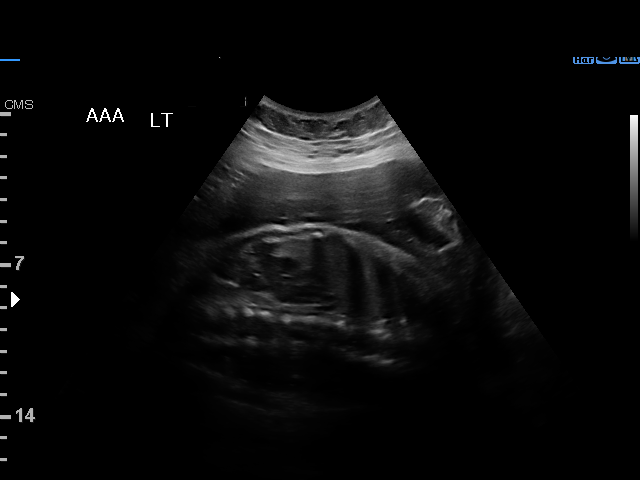
[im 6/49]
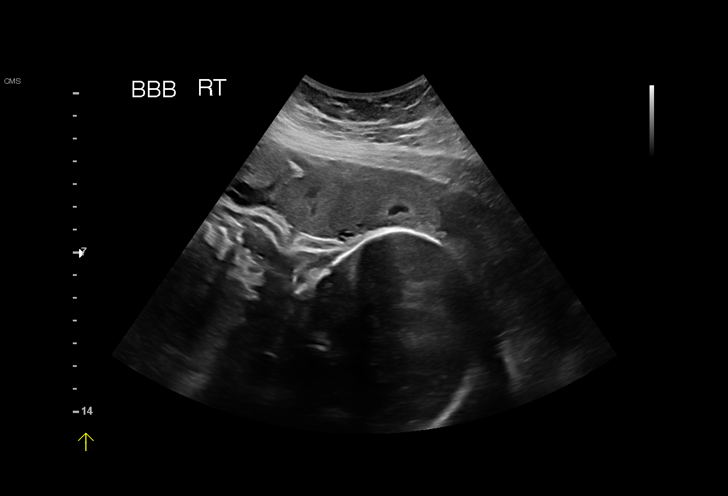
[im 9/49]
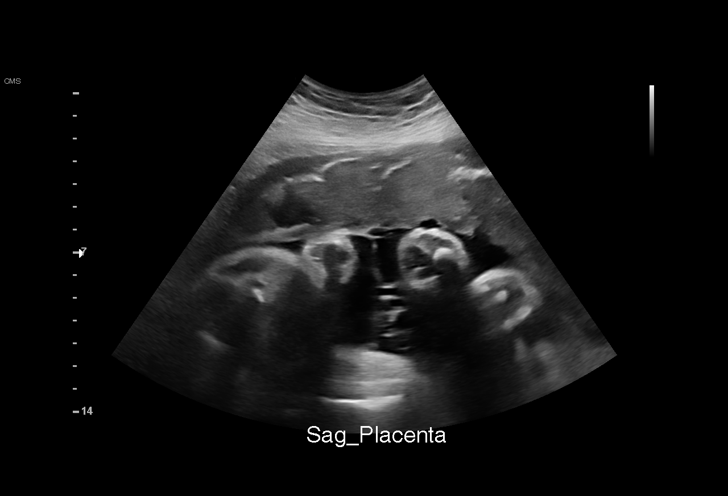
[im 13/49]
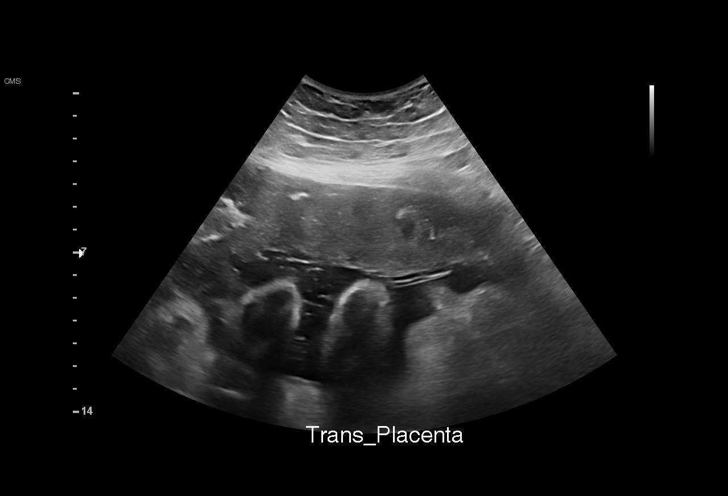
[im 17/49]
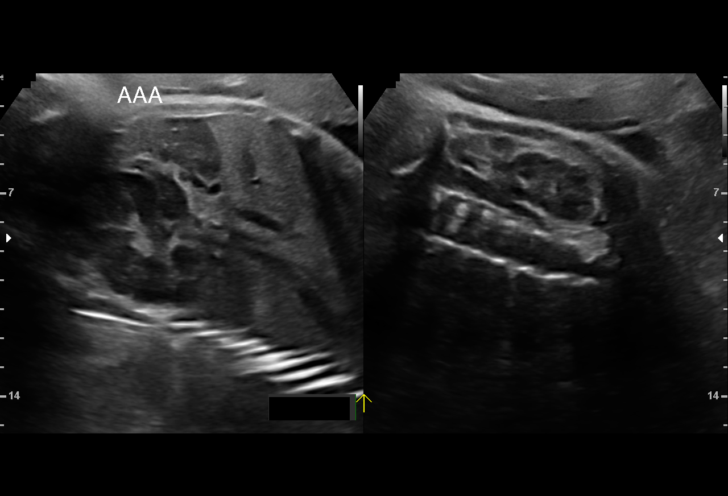
[im 20/49]
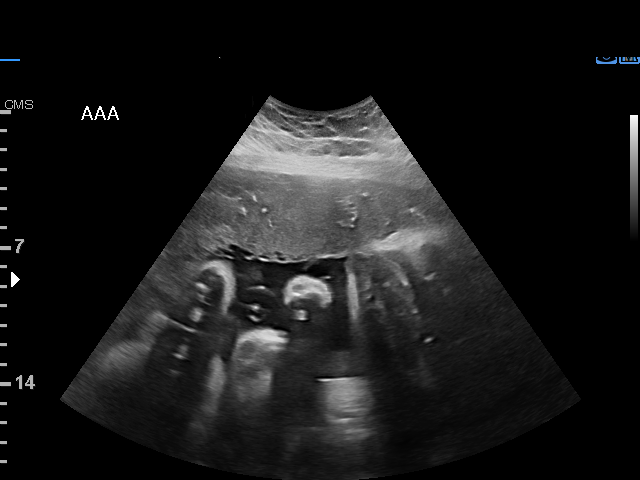
[im 24/49]
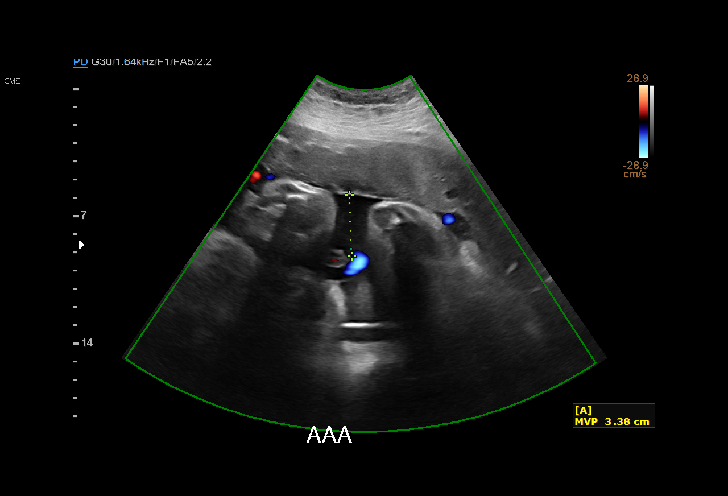
[im 27/49]
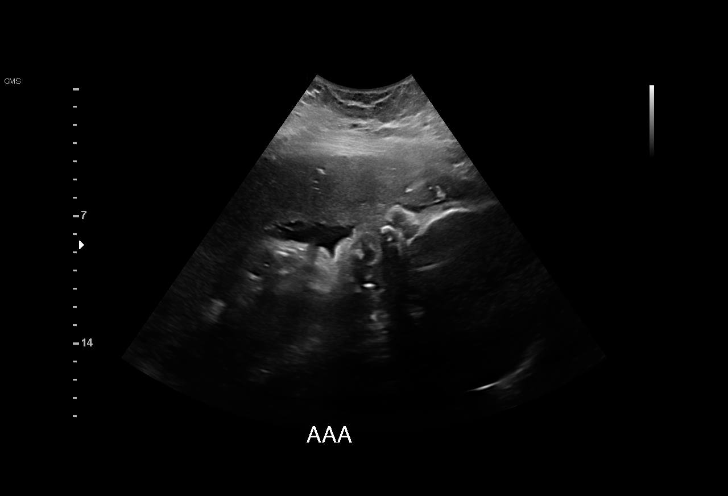
[im 31/49]
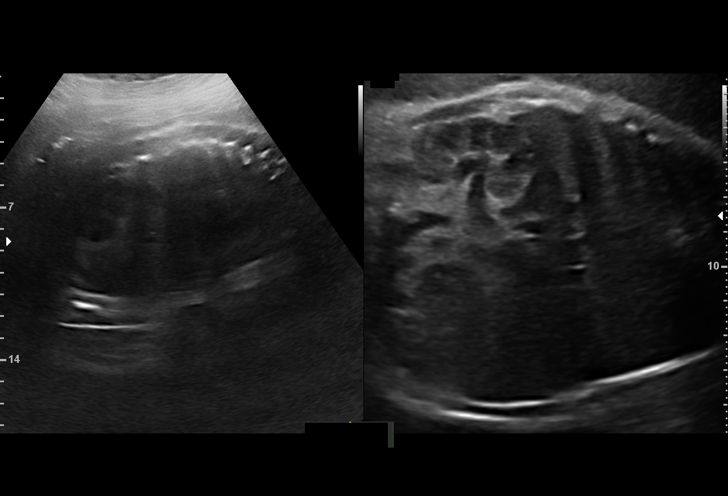
[im 34/49]
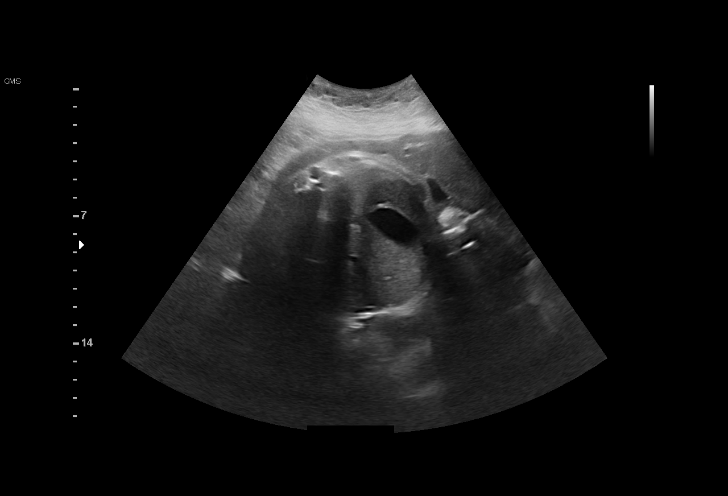
[im 38/49]
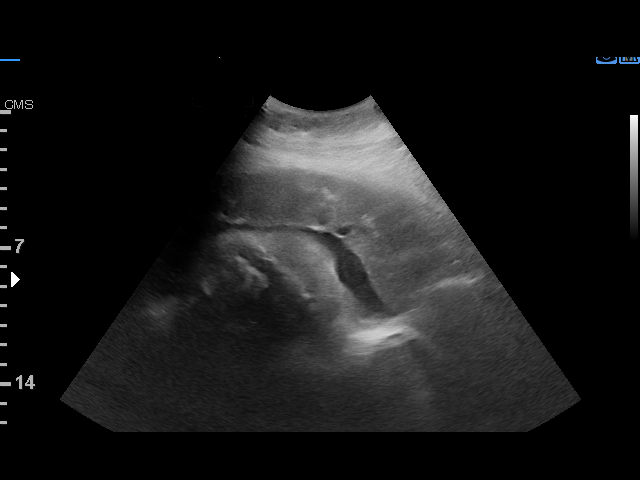
[im 41/49]
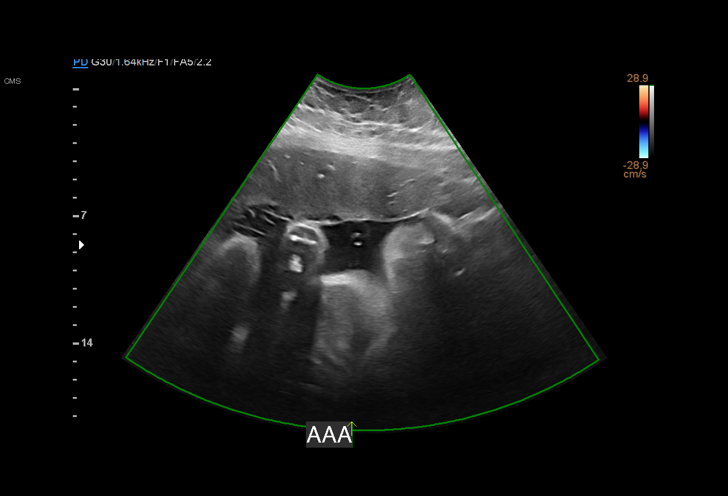
[im 45/49]
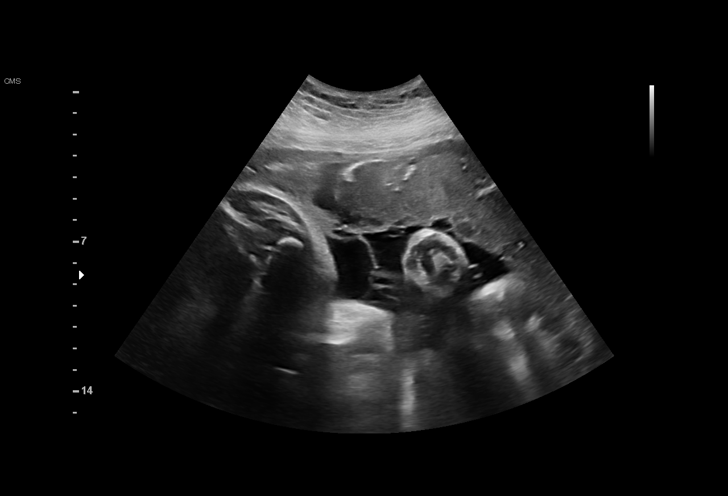
[im 49/49]
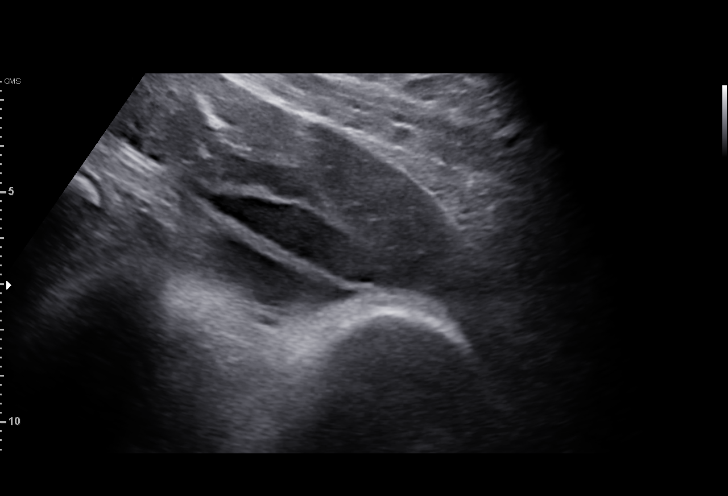

[14 of 28 positions shown; findings below may reference images not displayed]

HIPPOLYTE

    ADDL GESTATION

Indications

 Twin pregnancy, di/di, third trimester
 Hypertension - Chronic/Pre-existing
 (labetalol)
 34 weeks gestation of pregnancy
 A1c 5.1/5.4
 LR NIPS
Fetal Evaluation (Fetus A)

 Num Of Fetuses:         2
 Fetal Heart Rate(bpm):  157
 Cardiac Activity:       Observed
 Fetal Lie:              Maternal left side
 Presentation:           Cephalic
 Placenta:               Anterior
 P. Cord Insertion:      Previously Visualized
 Membrane Desc:      Dividing Membrane seen - Dichorionic.

 Amniotic Fluid
 AFI FV:      Within normal limits

                             Largest Pocket(cm)

Biophysical Evaluation (Fetus A)

 Amniotic F.V:   Pocket => 2 cm             F. Tone:        Observed
 F. Movement:    Observed                   Score:          [DATE]
 F. Breathing:   Observed
OB History

 Gravidity:    1         Term:   0        Prem:   0        SAB:   0
 TOP:          0       Ectopic:  0        Living: 0
Gestational Age (Fetus A)

 LMP:           34w 0d        Date:  04/20/20                 EDD:   01/25/21
 Best:          34w 0d     Det. By:  LMP  (04/20/20)          EDD:   01/25/21

Fetal Evaluation (Fetus B)

 Num Of Fetuses:         2
 Fetal Heart Rate(bpm):  140
 Cardiac Activity:       Observed
 Fetal Lie:              Maternal right side
 Presentation:           Cephalic
 Placenta:               Anterior
 P. Cord Insertion:      Previously Visualized
 Membrane Desc:      Dividing Membrane seen - Dichorionic.

 Amniotic Fluid
 AFI FV:      Within normal limits

                             Largest Pocket(cm)

Biophysical Evaluation (Fetus B)

 Amniotic F.V:   Within normal limits       F. Tone:        Observed
 F. Movement:    Observed                   Score:          [DATE]
 F. Breathing:   Observed
Gestational Age (Fetus B)

 LMP:           34w 0d        Date:  04/20/20                 EDD:   01/25/21
 Best:          34w 0d     Det. By:  LMP  (04/20/20)          EDD:   01/25/21
Cervix Uterus Adnexa

 Cervix
 Not visualized (advanced GA >59wks)

 Uterus
 No abnormality visualized.

 Cul De Sac
 No free fluid seen.
 Adnexa
 No abnormality visualized.
Comments

 This patient was seen for a biophysical profile due to a
 dichorionic, diamniotic twin gestation with chronic
 hypertension that is treated with both labetalol and Norvasc.
 Her blood pressures in our office today were 140/93 and
 134/93.  She denies any signs or symptoms of preeclampsia.
 A biophysical profile performed today was [DATE] twin A
 and twin B.
 There was normal amniotic fluid noted on today's ultrasound
 exam around both fetuses.
 Due to chronic hypertension and a dichorionic twin
 pregnancy, delivery may be considered at between 36 to 37
 weeks.
 Preeclampsia precautions were reviewed today.
 She will return in 1 week for another BPP.

## 2022-12-11 IMAGING — US US OB LIMITED
1 series · 14 of 22 positions shown · non-contrast
Comparison: none

CLINICAL DATA: Preeclampsia

EXAM:
LIMITED OBSTETRIC ULTRASOUND

[Series 1: ob us · 22 acquisitions, 14 frames shown]
[im 1/22]
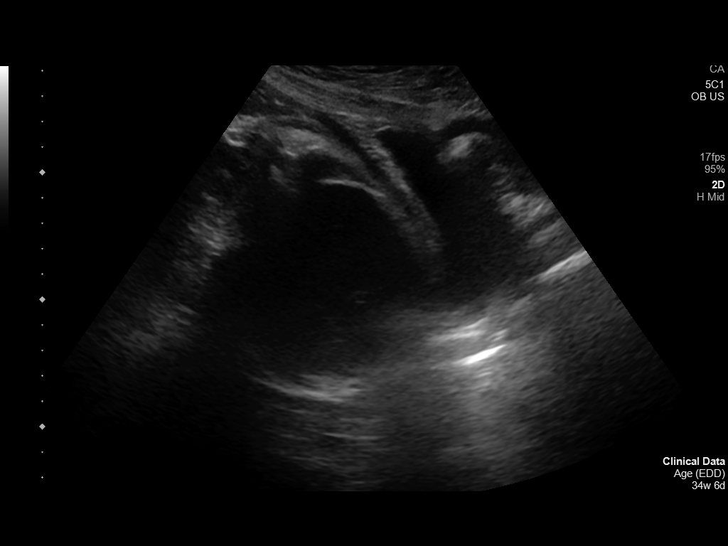
[im 3/22]
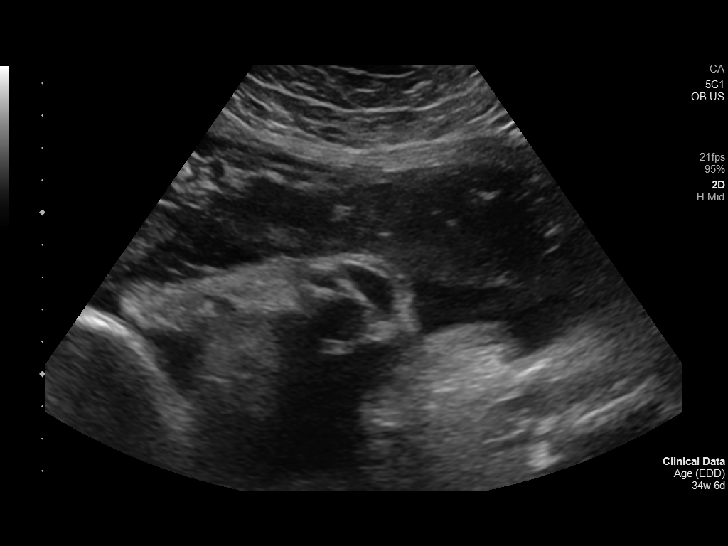
[im 4/22]
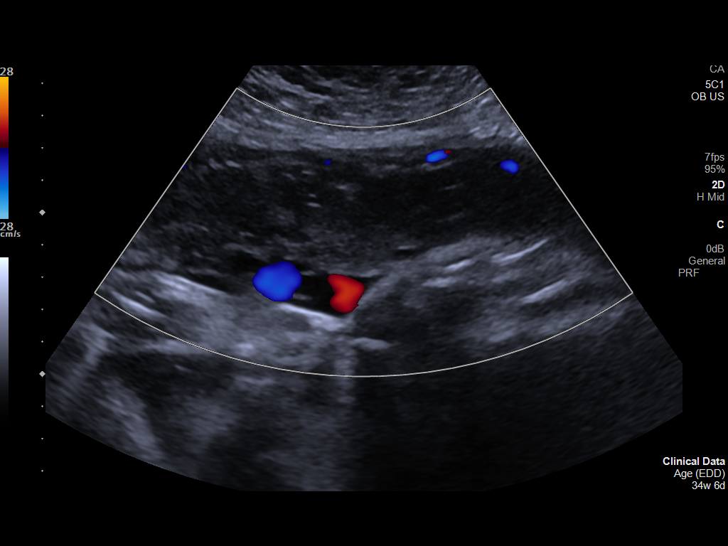
[im 6/22]
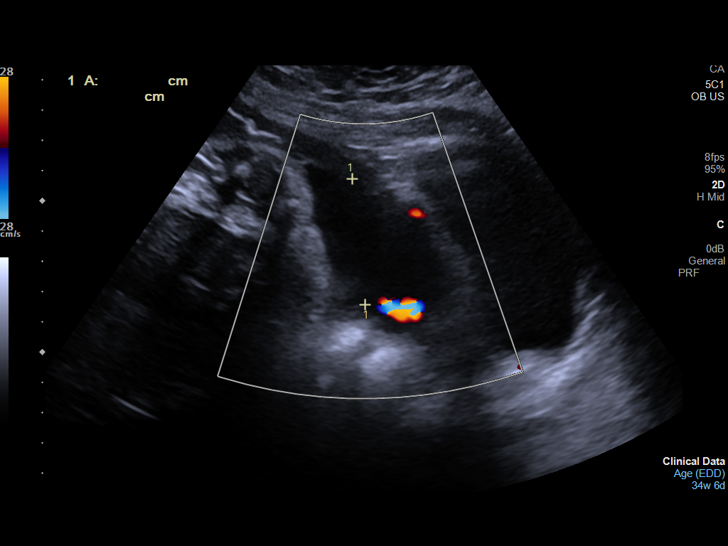
[im 8/22]
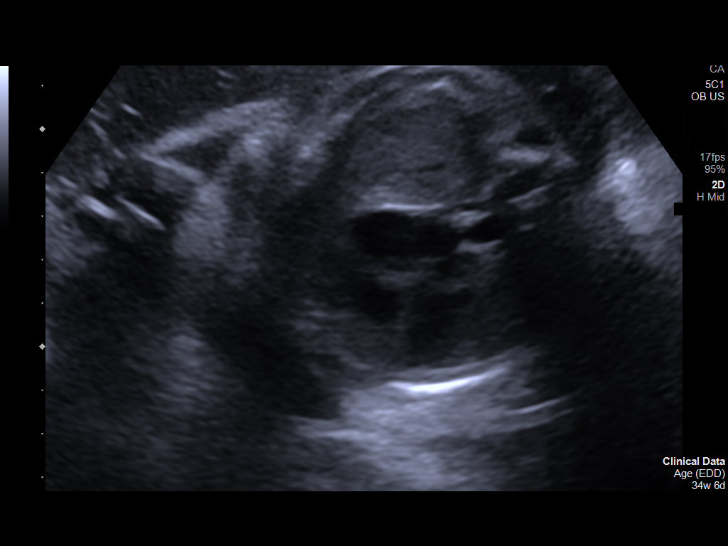
[im 9/22]
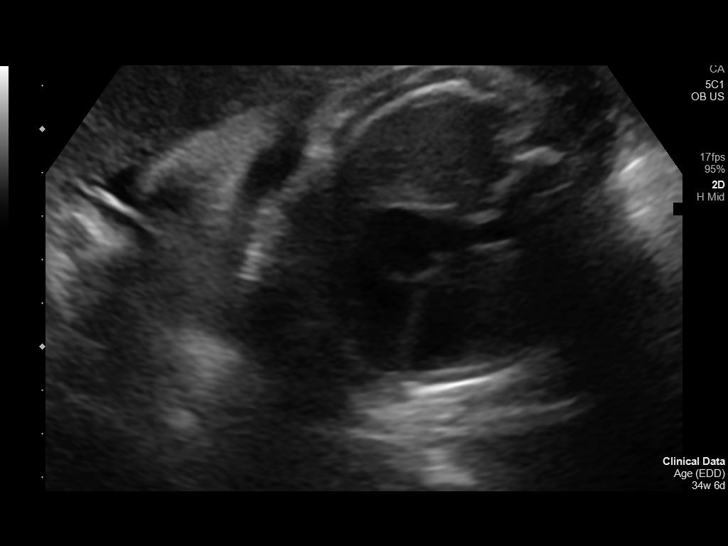
[im 11/22]
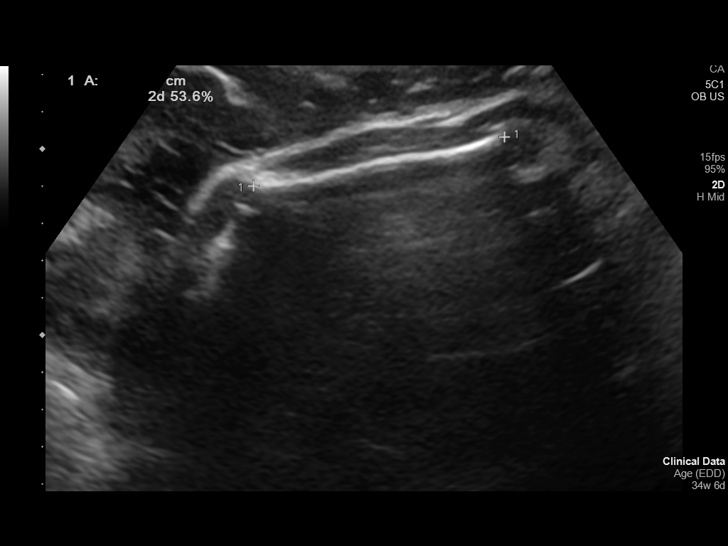
[im 12/22]
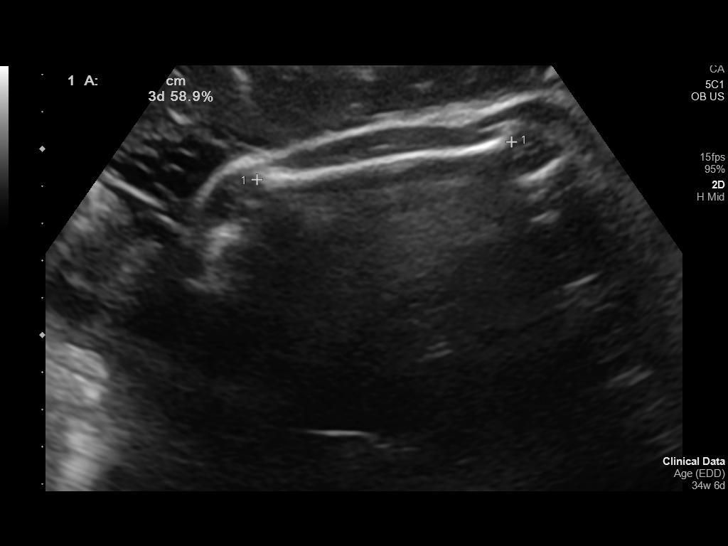
[im 14/22]
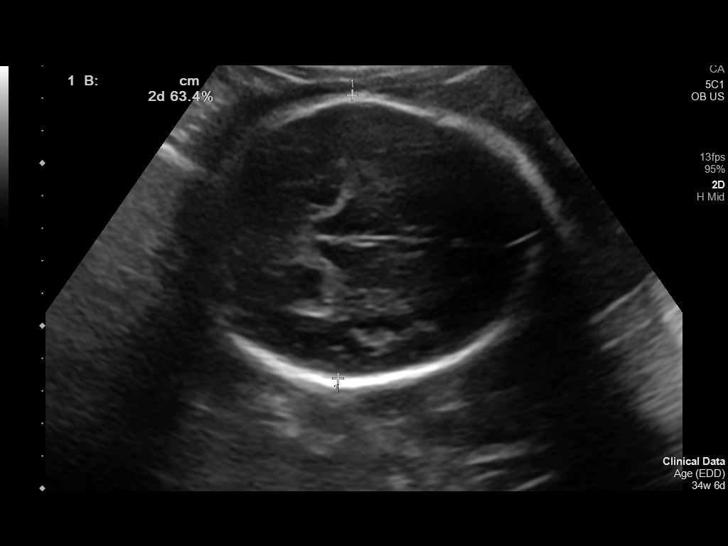
[im 15/22]
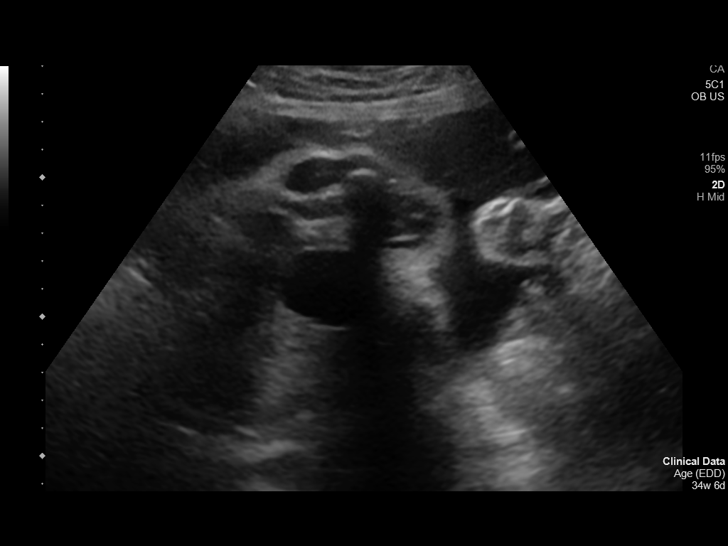
[im 17/22]
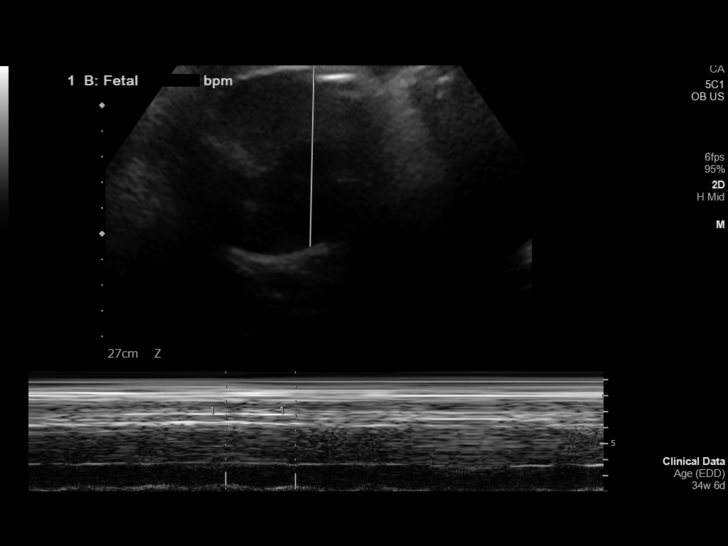
[im 19/22]
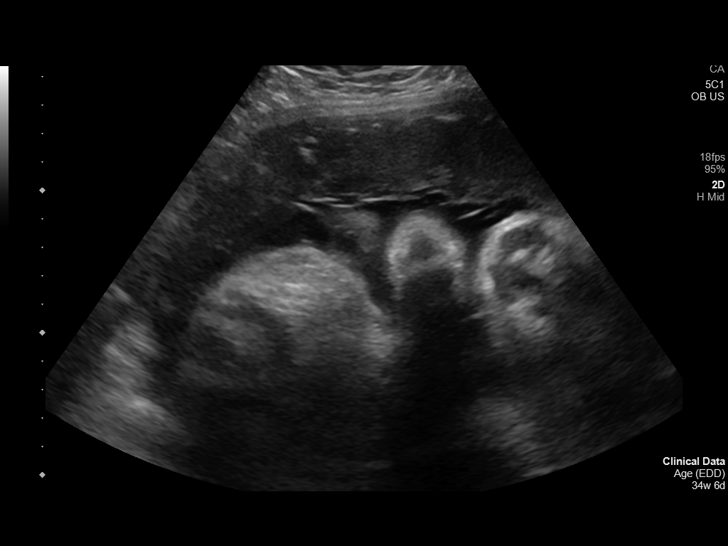
[im 20/22]
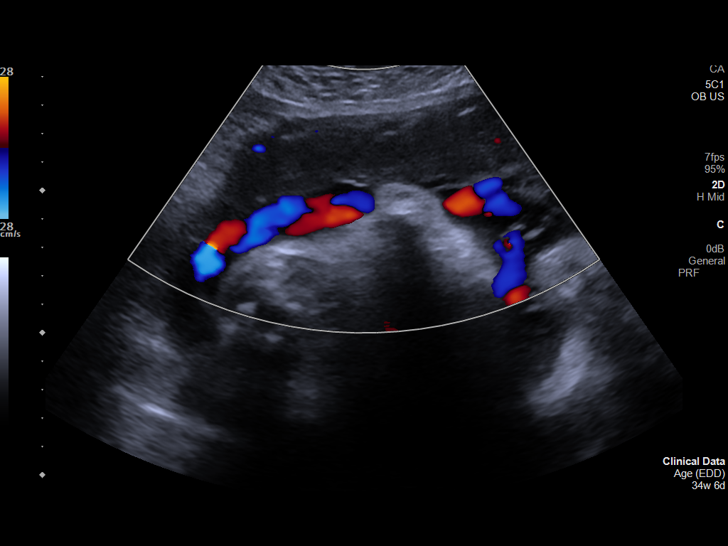
[im 22/22]
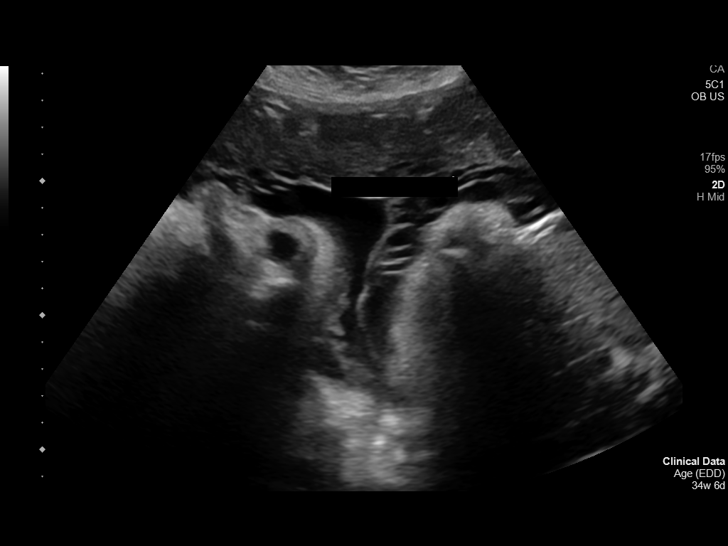

[14 of 22 positions shown; findings below may reference images not displayed]

FINDINGS: Number of Fetuses:  2

Separating Membrane:

TWIN 1

Heart Rate:  138 bpm

Movement: Present

Presentation: Cephalic on the maternal left side

Placental Location: Anterior

Previa: Absent

Amniotic Fluid (Subjective):  Within normal limits.  AFI: 4.2 cm

Femur length: 6.89cm 35w 3d

TWIN 2

Heart Rate:  147 bpm

Movement: Present

Presentation: Breech on the maternal right side

Placental Location: Anterior

Previa: Absent

Amniotic Fluid (Subjective): Normal AFI: 4.1 cm

BPD:  8.73cm 35w 2d

MATERNAL FINDINGS:

Cervix:  Appears closed.

Uterus/Adnexae: No abnormality visualized.
IMPRESSION: Twin fetuses as described above dated at 35 weeks 2 days. No acute
abnormality noted.

This exam is performed on an emergent basis and does not
comprehensively evaluate fetal size, dating, or anatomy; follow-up
complete OB US should be considered if further fetal assessment is
warranted

## 2023-01-11 ENCOUNTER — Ambulatory Visit: Payer: 59 | Admitting: Nurse Practitioner

## 2023-01-11 ENCOUNTER — Encounter: Payer: Self-pay | Admitting: Nurse Practitioner

## 2023-01-11 VITALS — BP 116/78 | HR 99 | Temp 98.0°F | Ht 71.0 in | Wt 237.0 lb

## 2023-01-11 DIAGNOSIS — R42 Dizziness and giddiness: Secondary | ICD-10-CM | POA: Insufficient documentation

## 2023-01-11 DIAGNOSIS — R0789 Other chest pain: Secondary | ICD-10-CM | POA: Diagnosis not present

## 2023-01-11 DIAGNOSIS — Z13220 Encounter for screening for lipoid disorders: Secondary | ICD-10-CM | POA: Diagnosis not present

## 2023-01-11 LAB — CBC WITH DIFFERENTIAL/PLATELET
Basophils Absolute: 0 10*3/uL (ref 0.0–0.1)
Basophils Relative: 0.4 % (ref 0.0–3.0)
Eosinophils Absolute: 0.1 10*3/uL (ref 0.0–0.7)
Eosinophils Relative: 1.5 % (ref 0.0–5.0)
HCT: 33.6 % — ABNORMAL LOW (ref 36.0–46.0)
Hemoglobin: 11 g/dL — ABNORMAL LOW (ref 12.0–15.0)
Lymphocytes Relative: 23.9 % (ref 12.0–46.0)
Lymphs Abs: 1.6 10*3/uL (ref 0.7–4.0)
MCHC: 32.9 g/dL (ref 30.0–36.0)
MCV: 82.1 fL (ref 78.0–100.0)
Monocytes Absolute: 0.4 10*3/uL (ref 0.1–1.0)
Monocytes Relative: 5.3 % (ref 3.0–12.0)
Neutro Abs: 4.6 10*3/uL (ref 1.4–7.7)
Neutrophils Relative %: 68.9 % (ref 43.0–77.0)
Platelets: 319 10*3/uL (ref 150.0–400.0)
RBC: 4.09 Mil/uL (ref 3.87–5.11)
RDW: 14.3 % (ref 11.5–15.5)
WBC: 6.6 10*3/uL (ref 4.0–10.5)

## 2023-01-11 LAB — COMPREHENSIVE METABOLIC PANEL
ALT: 9 U/L (ref 0–35)
AST: 12 U/L (ref 0–37)
Albumin: 4.3 g/dL (ref 3.5–5.2)
Alkaline Phosphatase: 53 U/L (ref 39–117)
BUN: 10 mg/dL (ref 6–23)
CO2: 28 meq/L (ref 19–32)
Calcium: 9.1 mg/dL (ref 8.4–10.5)
Chloride: 102 meq/L (ref 96–112)
Creatinine, Ser: 0.7 mg/dL (ref 0.40–1.20)
GFR: 114.38 mL/min (ref >60.00)
Glucose, Bld: 91 mg/dL (ref 70–99)
Potassium: 3.4 meq/L — ABNORMAL LOW (ref 3.5–5.1)
Sodium: 138 meq/L (ref 135–145)
Total Bilirubin: 0.3 mg/dL (ref 0.2–1.2)
Total Protein: 7.5 g/dL (ref 6.0–8.3)

## 2023-01-11 LAB — VITAMIN D 25 HYDROXY (VIT D DEFICIENCY, FRACTURES): VITD: 27.08 ng/mL — ABNORMAL LOW (ref 30.00–100.00)

## 2023-01-11 LAB — LIPID PANEL
Cholesterol: 151 mg/dL (ref 0–200)
HDL: 47.1 mg/dL (ref >39.00)
LDL Cholesterol: 84 mg/dL (ref 0–99)
NonHDL: 104.05
Total CHOL/HDL Ratio: 3
Triglycerides: 101 mg/dL (ref 0.0–149.0)
VLDL: 20.2 mg/dL (ref 0.0–40.0)

## 2023-01-11 LAB — B12 AND FOLATE PANEL
Folate: >25.2 ng/mL (ref >5.9)
Vitamin B-12: 522 pg/mL (ref 211–911)

## 2023-01-11 LAB — TSH: TSH: 1.3 u[IU]/mL (ref 0.35–5.50)

## 2023-01-11 NOTE — Progress Notes (Signed)
 Established Patient Office Visit  Subjective:  Patient ID: Samantha Glass, female    DOB: Jul 18, 1990  Age: 33 y.o. MRN: [id number]  CC:  Chief Complaint  Patient presents with   Acute Visit    Pain near left collarbone x 1 week   Discussed the use of a AI scribe  software for clinical note transcription with the patient, who gave verbal consent to proceed.   HPI  Samantha Glass presents for acute visit. The patient, with a history of hypertension, presents with a new onset of intermittent, sharp, left-sided chest pain that has been occurring for approximately one week. The pain, located near the left clavicle, lasts for a few minutes before fading away. The patient reports no exacerbation or relief with movement and denies any associated arm pain or difficulty moving the arm. The pain is more noticeable in the evenings, possibly due to decreased distractions.  In addition to the chest pain, the patient has also been experiencing intermittent lightheadedness and dizziness over the past week. The dizziness, described as a feeling of instability or off-balance, is sometimes concurrent with the chest pain, but also occurs independently. The patient has noticed that the dizziness is often triggered by standing up quickly, particularly from a bent-over position, but can also occur spontaneously while walking. Despite the dizziness, the patient has not experienced any falls or stumbles.  The patient denies any recent heavy lifting or falls, and reports no changes in appetite or hydration. They have been monitoring their blood pressure at home, which has been mostly within normal limits. The patient has twin toddlers and is frequently lifting and carrying them.  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   Hepatitis    middle school age   Hypertension    IDA (iron  deficiency anemia) 04/14/2019    Obesity (BMI 30.0-34.9) 04/14/2019   UTI (urinary tract infection)     Past Surgical History:  Procedure Laterality Date   GALLBLADDER SURGERY  2021   TONSILLECTOMY  age 36   UMBILICAL HERNIA REPAIR N/A 11/24/2019   Procedure: HERNIA REPAIR UMBILICAL ADULT, open;  Surgeon: Desiderio Schanz, MD;  Location: [facility];  Service: General;  Laterality: N/A;   VAGINAL DELIVERY  12/23/2020   Procedure: VAGINAL DELIVERY;  Surgeon: Arloa Lamar SQUIBB, MD;  Location: [facility];  Service: Obstetrics;;  Twin Delivery in OR. Procedure 1: Baby A   VAGINAL DELIVERY  12/23/2020   Procedure: VAGINAL DELIVERY;  Surgeon: Arloa Lamar SQUIBB, MD;  Location: [facility];  Service: Obstetrics;;  Twin Delivery in OR. Procedure 2: Baby B    Family History  Problem Relation Age of Onset   Hypertension Mother    Depression Mother    Hypertension Maternal Grandmother    Arthritis Maternal Grandmother    Diabetes Paternal Grandmother    Hypertension Paternal Grandmother    Hyperlipidemia Paternal Grandmother    Hypertension Paternal Grandfather     Social History   Socioeconomic History   Marital status: Married    Spouse name: Not on file   Number of children: Not on file   Years of education: Not on file   Highest education level: Doctorate  Occupational History   Not on file  Tobacco Use   Smoking status: Never   Smokeless tobacco: Never  Vaping Use   Vaping status: Never Used  Substance and Sexual Activity   Alcohol use: Not  Currently    Comment: occassional   Drug use: No   Sexual activity: Yes    Birth control/protection: None    Comment: undecided  Other Topics Concern   Not on file  Social History Narrative   Not on file   Social Drivers of Health   Financial Resource Strain: Low Risk  (01/10/2023)   Overall Financial Resource Strain (CARDIA)    Difficulty of Paying Living Expenses: Not hard at all  Food Insecurity: No Food Insecurity (01/10/2023)   Hunger Vital Sign    Worried About Running  Out of Food in the Last Year: Never true    Ran Out of Food in the Last Year: Never true  Transportation Needs: No Transportation Needs (01/10/2023)   PRAPARE - Administrator, Civil Service (Medical): No    Lack of Transportation (Non-Medical): No  Physical Activity: Insufficiently Active (01/10/2023)   Exercise Vital Sign    Days of Exercise per Week: 4 days    Minutes of Exercise per Session: 20 min  Stress: No Stress Concern Present (01/10/2023)   [employer] of Occupational Health - Occupational Stress Questionnaire    Feeling of Stress : Only a little  Social Connections: Socially Isolated (01/10/2023)   Social Connection and Isolation Panel [NHANES]    Frequency of Communication with Friends and Family: Once a week    Frequency of Social Gatherings with Friends and Family: Once a week    Attends Religious Services: Never    Database Administrator or Organizations: No    Attends Engineer, Structural: Not on file    Marital Status: Married  Catering Manager Violence: Not on file     Outpatient Medications Prior to Visit  Medication Sig Dispense Refill   buPROPion  (WELLBUTRIN  XL) 300 MG 24 hr tablet Take 1 tablet (300 mg total) by mouth daily. 90 tablet 3   hydrochlorothiazide  (HYDRODIURIL ) 25 MG tablet Take 1 tablet (25 mg total) by mouth daily. 90 tablet 2   Facility-Administered Medications Prior to Visit  Medication Dose Route Frequency Provider Last Rate Last Admin   buPROPion  (WELLBUTRIN  XL) 24 hr tablet 300 mg  300 mg Oral Daily Arloa Lamar SQUIBB, MD        No Known Allergies  ROS Review of Systems Negative unless indicated in HPI.    Objective:    Physical Exam  BP 116/78   Pulse 99   Temp 98 F (36.7 C)   Ht 5' 11 (1.803 m)   Wt 237 lb (107.5 kg)   SpO2 99%   BMI 33.05 kg/m  Wt Readings from Last 3 Encounters:  01/11/23 237 lb (107.5 kg)  08/09/22 257 lb 6.4 oz (116.8 kg)  07/21/21 259 lb 11.2 oz (117.8 kg)     Health  Maintenance  Topic Date Due   DTaP/Tdap/Td (3 - Tdap) 06/15/2018   COVID-19 Vaccine (5 - 2024-25 season) 01/25/2023 (Originally 09/02/2022)   INFLUENZA VACCINE  04/01/2023 (Originally 08/02/2022)   Cervical Cancer Screening (HPV/Pap Cotest)  02/02/2026   HPV VACCINES  Completed   Hepatitis C Screening  Completed   HIV Screening  Completed    There are no preventive care reminders to display for this patient.  Lab Results  Component Value Date   TSH 1.89 03/04/2020   Lab Results  Component Value Date   WBC 15.4 (H) 12/28/2020   HGB 8.3 (L) 12/28/2020   HCT 25.0 (L) 12/28/2020   MCV 96.9 12/28/2020   PLT  242 12/28/2020   Lab Results  Component Value Date   NA 135 12/28/2020   K 4.1 12/28/2020   CO2 23 12/28/2020   GLUCOSE 106 (H) 12/28/2020   BUN 10 12/28/2020   CREATININE 0.74 12/28/2020   BILITOT 0.5 12/28/2020   ALKPHOS 91 12/28/2020   AST 36 12/28/2020   ALT 42 12/28/2020   PROT 6.5 12/28/2020   ALBUMIN 3.1 (L) 12/28/2020   CALCIUM  8.9 12/28/2020   ANIONGAP 5 12/28/2020   EGFR 123 06/21/2020   GFR 109.06 04/14/2019   Lab Results  Component Value Date   CHOL 149 04/14/2019   Lab Results  Component Value Date   HDL 58.30 04/14/2019   Lab Results  Component Value Date   LDLCALC 71 04/14/2019   Lab Results  Component Value Date   TRIG 98.0 04/14/2019   Lab Results  Component Value Date   CHOLHDL 3 04/14/2019   Lab Results  Component Value Date   HGBA1C 5.4 06/21/2020      Assessment & Plan:  Atypical chest pain Assessment & Plan: New onset, intermittent, sharp, localized pain in the left upper chest for the past week. No exacerbating or relieving factors identified. No associated arm pain or difficulty moving the arm. EKG normal. -Order blood work to rule out any systemic causes.   Dizziness Assessment & Plan: Intermittent lightheadedness and feeling of instability for the past week. More pronounced with positional changes but also occurs at  rest. No associated falls or syncope. -Advise slow positional changes to prevent orthostatic hypotension. -Check blood pressure daily for two weeks and report values via patient portal. -Order blood work to rule out any systemic causes including vitamin deficiencies.   Orders: -     EKG 12-Lead -     CBC with Differential/Platelet -     Comprehensive metabolic panel -     TSH -     VITAMIN D  25 Hydroxy (Vit-D Deficiency, Fractures) -     B12 and Folate Panel  Screening, lipid -     Lipid panel    Follow-up: Return in about 1 month (around 02/11/2023), or if symptoms worsen or fail to improve, for physical.   Chelsea Aurora, NP

## 2023-01-11 NOTE — Patient Instructions (Signed)
 Please go to the lab for blood work. Please monitor blood pressure daily once a day  for 2 weeks and send [place] the readings via MyChart.

## 2023-01-11 NOTE — Assessment & Plan Note (Signed)
 New onset, intermittent, sharp, localized pain in the left upper chest for the past week. No exacerbating or relieving factors identified. No associated arm pain or difficulty moving the arm. EKG normal. -Order blood work to rule out any systemic causes.

## 2023-01-11 NOTE — Assessment & Plan Note (Signed)
 Intermittent lightheadedness and feeling of instability for the past week. More pronounced with positional changes but also occurs at rest. No associated falls or syncope. -Advise slow positional changes to prevent orthostatic hypotension. -Check blood pressure daily for two weeks and report values via patient portal. -Order blood work to rule out any systemic causes including vitamin deficiencies.

## 2023-02-14 ENCOUNTER — Encounter: Payer: 59 | Admitting: Nurse Practitioner

## 2023-02-14 ENCOUNTER — Ambulatory Visit: Payer: Managed Care, Other (non HMO) | Admitting: Nurse Practitioner

## 2023-02-21 DIAGNOSIS — Z0000 Encounter for general adult medical examination without abnormal findings: Secondary | ICD-10-CM | POA: Insufficient documentation

## 2023-02-21 NOTE — Progress Notes (Unsigned)
Established Patient Office Visit  Subjective:  Patient ID: Samantha Glass, female    DOB: [birth year]  Age: 33 y.o. MRN: [id number]  CC: No chief complaint on file.   HPI  Samantha Glass presents for her annual physical exam.  Flu:  Tetanus: due COVID: Pap smear: 2023 Dentist: Eye examination: Exercise:   Family h/o breast cancer:  Diet: Patient does/ does not eat meat. Patient consumes fruits and veggies. Patient eat some .Marland Kitchen... fried food. Patient drinks water, ... 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   Hepatitis    middle school age   Hypertension    IDA (iron deficiency anemia) 04/14/2019   Obesity (BMI 30.0-34.9) 04/14/2019   UTI (urinary tract infection)     Past Surgical History:  Procedure Laterality Date   GALLBLADDER SURGERY  2021   TONSILLECTOMY  age 31   UMBILICAL HERNIA REPAIR N/A 11/24/2019   Procedure: HERNIA REPAIR UMBILICAL ADULT, open;  Surgeon: Henrene Dodge, MD;  Location: [facility];  Service: General;  Laterality: N/A;   VAGINAL DELIVERY  12/23/2020   Procedure: VAGINAL DELIVERY;  Surgeon: Nadara Mustard, MD;  Location: [facility];  Service: Obstetrics;;  Twin Delivery in OR. Procedure 1: Baby A   VAGINAL DELIVERY  12/23/2020   Procedure: VAGINAL DELIVERY;  Surgeon: Nadara Mustard, MD;  Location: [facility];  Service: Obstetrics;;  Twin Delivery in OR. Procedure 2: Baby B    Family History  Problem Relation Age of Onset   Hypertension Mother    Depression Mother    Hypertension Maternal Grandmother    Arthritis Maternal Grandmother    Diabetes Paternal Grandmother    Hypertension Paternal Grandmother    Hyperlipidemia Paternal Grandmother    Hypertension Paternal Grandfather     Social History   Socioeconomic History   Marital status: Married    Spouse name: Not on file   Number of children: Not on file   Years of  education: Not on file   Highest education level: Doctorate  Occupational History   Not on file  Tobacco Use   Smoking status: Never   Smokeless tobacco: Never  Vaping Use   Vaping status: Never Used  Substance and Sexual Activity   Alcohol use: Not Currently    Comment: occassional   Drug use: No   Sexual activity: Yes    Birth control/protection: None    Comment: undecided  Other Topics Concern   Not on file  Social History Narrative   Not on file   Social Drivers of Health   Financial Resource Strain: Low Risk  (01/10/2023)   Overall Financial Resource Strain (CARDIA)    Difficulty of Paying Living Expenses: Not hard at all  Food Insecurity: No Food Insecurity (01/10/2023)   Hunger Vital Sign    Worried About Running Out of Food in the Last Year: Never true    Ran Out of Food in the Last Year: Never true  Transportation Needs: No Transportation Needs (01/10/2023)   PRAPARE - Administrator, Civil Service (Medical): No    Lack of Transportation (Non-Medical): No  Physical Activity: Insufficiently Active (01/10/2023)   Exercise Vital Sign    Days of Exercise per Week: 4 days    Minutes of Exercise per Session: 20 min  Stress: No Stress Concern Present (01/10/2023)   [employer] of Occupational Health -  Occupational Stress Questionnaire    Feeling of Stress : Only a little  Social Connections: Socially Isolated (01/10/2023)   Social Connection and Isolation Panel [NHANES]    Frequency of Communication with Friends and Family: Once a week    Frequency of Social Gatherings with Friends and Family: Once a week    Attends Religious Services: Never    Database administrator or Organizations: No    Attends Engineer, structural: Not on file    Marital Status: Married  Catering manager Violence: Not on file     Outpatient Medications Prior to Visit  Medication Sig Dispense Refill   buPROPion (WELLBUTRIN XL) 300 MG 24 hr tablet Take 1 tablet (300 mg total)  by mouth daily. 90 tablet 3   hydrochlorothiazide (HYDRODIURIL) 25 MG tablet Take 1 tablet (25 mg total) by mouth daily. 90 tablet 2   Facility-Administered Medications Prior to Visit  Medication Dose Route Frequency Provider Last Rate Last Admin   buPROPion (WELLBUTRIN XL) 24 hr tablet 300 mg  300 mg Oral Daily Nadara Mustard, MD        No Known Allergies  ROS Review of Systems Negative unless indicated in HPI.    Objective:    Physical Exam  There were no vitals taken for this visit. Wt Readings from Last 3 Encounters:  01/11/23 237 lb (107.5 kg)  08/09/22 257 lb 6.4 oz (116.8 kg)  07/21/21 259 lb 11.2 oz (117.8 kg)     Health Maintenance  Topic Date Due   DTaP/Tdap/Td (3 - Tdap) 06/15/2018   COVID-19 Vaccine (5 - 2024-25 season) 09/02/2022   INFLUENZA VACCINE  04/01/2023 (Originally 08/02/2022)   Cervical Cancer Screening (HPV/Pap Cotest)  02/02/2026   HPV VACCINES  Completed   Hepatitis C Screening  Completed   HIV Screening  Completed    There are no preventive care reminders to display for this patient.  Lab Results  Component Value Date   TSH 1.30 01/11/2023   Lab Results  Component Value Date   WBC 6.6 01/11/2023   HGB 11.0 (L) 01/11/2023   HCT 33.6 (L) 01/11/2023   MCV 82.1 01/11/2023   PLT 319.0 01/11/2023   Lab Results  Component Value Date   NA 138 01/11/2023   K 3.4 (L) 01/11/2023   CO2 28 01/11/2023   GLUCOSE 91 01/11/2023   BUN 10 01/11/2023   CREATININE 0.70 01/11/2023   BILITOT 0.3 01/11/2023   ALKPHOS 53 01/11/2023   AST 12 01/11/2023   ALT 9 01/11/2023   PROT 7.5 01/11/2023   ALBUMIN 4.3 01/11/2023   CALCIUM 9.1 01/11/2023   ANIONGAP 5 12/28/2020   EGFR 123 06/21/2020   GFR 114.38 01/11/2023   Lab Results  Component Value Date   CHOL 151 01/11/2023   Lab Results  Component Value Date   HDL 47.10 01/11/2023   Lab Results  Component Value Date   LDLCALC 84 01/11/2023   Lab Results  Component Value Date   TRIG 101.0  01/11/2023   Lab Results  Component Value Date   CHOLHDL 3 01/11/2023   Lab Results  Component Value Date   HGBA1C 5.4 06/21/2020      Assessment & Plan:  There are no diagnoses linked to this encounter.  Follow-up: No follow-ups on file.   Kara Dies, NP

## 2023-02-22 ENCOUNTER — Encounter: Payer: Self-pay | Admitting: Nurse Practitioner

## 2023-02-22 ENCOUNTER — Ambulatory Visit (INDEPENDENT_AMBULATORY_CARE_PROVIDER_SITE_OTHER): Payer: 59 | Admitting: Nurse Practitioner

## 2023-02-22 VITALS — BP 118/80 | HR 80 | Temp 97.8°F | Ht 71.0 in | Wt 235.4 lb

## 2023-02-22 DIAGNOSIS — Z0000 Encounter for general adult medical examination without abnormal findings: Secondary | ICD-10-CM

## 2023-02-22 DIAGNOSIS — D649 Anemia, unspecified: Secondary | ICD-10-CM | POA: Diagnosis not present

## 2023-02-22 NOTE — Patient Instructions (Addendum)
Iron-Rich Diet  Iron is a mineral that helps your body produce hemoglobin. Hemoglobin is a protein in red blood cells that carries oxygen to your body's tissues. Eating too little iron may cause you to feel weak and tired, and it can increase your risk of infection. Iron is naturally found in many foods, and many foods have iron added to them (are iron-fortified). You may need to follow an iron-rich diet if you do not have enough iron in your body due to certain medical conditions. The amount of iron that you need each day depends on your age, your sex, and any medical conditions you have. Follow instructions from your health care provider or a dietitian about how much iron you should eat each day. What are tips for following this plan? Reading food labels Check food labels to see how many milligrams (mg) of iron are in each serving. Cooking Cook foods in pots and pans that are made from iron. Take these steps to make it easier for your body to absorb iron from certain foods: Soak beans overnight before cooking. Soak whole grains overnight and drain them before using. Ferment flours before baking, such as by using yeast in bread dough. Meal planning When you eat foods that contain iron, you should eat them with foods that are high in vitamin C. These include oranges, peppers, tomatoes, potatoes, and mangoes. Vitamin C helps your body absorb iron. Certain foods and drinks prevent your body from absorbing iron properly. Avoid eating these foods in the same meal as iron-rich foods or with iron supplements. These foods include: Coffee, black tea, and red wine. Milk, dairy products, and foods that are high in calcium. Beans and soybeans. Whole grains. General information Take iron supplements only as told by your health care provider. An overdose of iron can be life-threatening. If you were prescribed iron supplements, take them with orange juice or a vitamin C supplement. When you eat  iron-fortified foods or take an iron supplement, you should also eat foods that naturally contain iron, such as meat, poultry, and fish. Eating naturally iron-rich foods helps your body absorb the iron that is added to other foods or contained in a supplement. Iron from animal sources is better absorbed than iron from plant sources. What foods should I eat? Fruits Prunes. Raisins. Eat fruits high in vitamin C, such as oranges, grapefruits, and strawberries, with iron-rich foods. Vegetables Spinach (cooked). Green peas. Broccoli. Fermented vegetables. Eat vegetables high in vitamin C, such as leafy greens, potatoes, bell peppers, and tomatoes, with iron-rich foods. Grains Iron-fortified breakfast cereal. Iron-fortified whole-wheat bread. Enriched rice. Sprouted grains. Meats and other proteins Beef liver. Beef. [place]. Chicken. Oysters. Shrimp. Tuna. Sardines. Chickpeas. Nuts. Tofu. Pumpkin seeds. Beverages Tomato juice. Fresh orange juice. Prune juice. Hibiscus tea. Iron-fortified instant breakfast shakes. Sweets and desserts Blackstrap molasses. Seasonings and condiments Tahini. Fermented soy sauce. Other foods Wheat germ. The items listed above may not be a complete list of recommended foods and beverages. Contact a dietitian for more information. What foods should I limit? These are foods that should be limited while eating iron-rich foods as they can reduce the absorption of iron in your body. Grains Whole grains. Bran cereal. Bran flour. Meats and other proteins Soybeans. Products made from soy protein. Black beans. Lentils. Mung beans. Split peas. Dairy Milk. Cream. Cheese. Yogurt. Cottage cheese. Beverages Coffee. Black tea. Red wine. Sweets and desserts Cocoa. Chocolate. Ice cream. Seasonings and condiments Basil. Oregano. Large amounts of parsley. The items listed  above may not be a complete list of foods and beverages you should limit. Contact a dietitian for more  information. Summary Iron is a mineral that helps your body produce hemoglobin. Hemoglobin is a protein in red blood cells that carries oxygen to your body's tissues. Iron is naturally found in many foods, and many foods have iron added to them (are iron-fortified). When you eat foods that contain iron, you should eat them with foods that are high in vitamin C. Vitamin C helps your body absorb iron. Certain foods and drinks prevent your body from absorbing iron properly, such as whole grains and dairy products. You should avoid eating these foods in the same meal as iron-rich foods or with iron supplements. This information is not intended to replace advice given to you by your health care provider. Make sure you discuss any questions you have with your health care provider. Document Revised: 11/30/2019 Document Reviewed: 11/30/2019 Elsevier Patient Education  2024 Elsevier Inc.  Health Maintenance, Female Adopting a healthy lifestyle and getting preventive care are important in promoting health and wellness. Ask your health care provider about: The right schedule for you to have regular tests and exams. Things you can do on your own to prevent diseases and keep yourself healthy. What should I know about diet, weight, and exercise? Eat a healthy diet  Eat a diet that includes plenty of vegetables, fruits, low-fat dairy products, and lean protein. Do not eat a lot of foods that are high in solid fats, added sugars, or sodium. Maintain a healthy weight Body mass index (BMI) is used to identify weight problems. It estimates body fat based on height and weight. Your health care provider can help determine your BMI and help you achieve or maintain a healthy weight. Get regular exercise Get regular exercise. This is one of the most important things you can do for your health. Most adults should: Exercise for at least 150 minutes each week. The exercise should increase your heart rate and make you  sweat (moderate-intensity exercise). Do strengthening exercises at least twice a week. This is in addition to the moderate-intensity exercise. Spend less time sitting. Even light physical activity can be beneficial. Watch cholesterol and blood lipids Have your blood tested for lipids and cholesterol at 33 years of age, then have this test every 5 years. Have your cholesterol levels checked more often if: Your lipid or cholesterol levels are high. You are older than 33 years of age. You are at high risk for heart disease. What should I know about cancer screening? Depending on your health history and family history, you may need to have cancer screening at various ages. This may include screening for: Breast cancer. Cervical cancer. Colorectal cancer. Skin cancer. Lung cancer. What should I know about heart disease, diabetes, and high blood pressure? Blood pressure and heart disease High blood pressure causes heart disease and increases the risk of stroke. This is more likely to develop in people who have high blood pressure readings or are overweight. Have your blood pressure checked: Every 3-5 years if you are 3-53 years of age. Every year if you are 24 years old or older. Diabetes Have regular diabetes screenings. This checks your fasting blood sugar level. Have the screening done: Once every three years after age 70 if you are at a normal weight and have a low risk for diabetes. More often and at a younger age if you are overweight or have a high risk for diabetes. What should  I know about preventing infection? Hepatitis B If you have a higher risk for hepatitis B, you should be screened for this virus. Talk with your health care provider to find out if you are at risk for hepatitis B infection. Hepatitis C Testing is recommended for: Everyone born from [birth year] through 1965. Anyone with known risk factors for hepatitis C. Sexually transmitted infections (STIs) Get screened for  STIs, including gonorrhea and chlamydia, if: You are sexually active and are younger than 33 years of age. You are older than 33 years of age and your health care provider tells you that you are at risk for this type of infection. Your sexual activity has changed since you were last screened, and you are at increased risk for chlamydia or gonorrhea. Ask your health care provider if you are at risk. Ask your health care provider about whether you are at high risk for HIV. Your health care provider may recommend a prescription medicine to help prevent HIV infection. If you choose to take medicine to prevent HIV, you should first get tested for HIV. You should then be tested every 3 months for as long as you are taking the medicine. Pregnancy If you are about to stop having your period (premenopausal) and you may become pregnant, seek counseling before you get pregnant. Take 400 to 800 micrograms (mcg) of folic acid every day if you become pregnant. Ask for birth control (contraception) if you want to prevent pregnancy. Osteoporosis and menopause Osteoporosis is a disease in which the bones lose minerals and strength with aging. This can result in bone fractures. If you are 16 years old or older, or if you are at risk for osteoporosis and fractures, ask your health care provider if you should: Be screened for bone loss. Take a calcium or vitamin D supplement to lower your risk of fractures. Be given hormone replacement therapy (HRT) to treat symptoms of menopause. Follow these instructions at home: Alcohol use Do not drink alcohol if: Your health care provider tells you not to drink. You are pregnant, may be pregnant, or are planning to become pregnant. If you drink alcohol: Limit how much you have to: 0-1 drink a day. Know how much alcohol is in your drink. In the [place] one drink equals one 12 oz bottle of beer (355 mL), one 5 oz glass of wine (148 mL), or one 1 oz glass of hard liquor (44  mL). Lifestyle Do not use any products that contain nicotine or tobacco. These products include cigarettes, chewing tobacco, and vaping devices, such as e-cigarettes. If you need help quitting, ask your health care provider. Do not use street drugs. Do not share needles. Ask your health care provider for help if you need support or information about quitting drugs. General instructions Schedule regular health, dental, and eye exams. Stay current with your vaccines. Tell your health care provider if: You often feel depressed. You have ever been abused or do not feel safe at home. Summary Adopting a healthy lifestyle and getting preventive care are important in promoting health and wellness. Follow your health care provider's instructions about healthy diet, exercising, and getting tested or screened for diseases. Follow your health care provider's instructions on monitoring your cholesterol and blood pressure. This information is not intended to replace advice given to you by your health care provider. Make sure you discuss any questions you have with your health care provider. Document Revised: 05/09/2020 Document Reviewed: 05/09/2020 Elsevier Patient Education  2024 [employer].

## 2023-02-22 NOTE — Assessment & Plan Note (Addendum)
PAP uptodate, Breast examination performed. Encouraged patient to consume a balanced diet and regular exercise regimen. Advised to see an eye doctor and dentist annually.  Take Ferrous sulfate 325 mg 1 tablet, 1-3 times a day. Consume food containing high iron content: Chicken, [place], tuna, enriched cereal, asparagus, eggs, tofu etc

## 2023-04-17 ENCOUNTER — Other Ambulatory Visit: Payer: Self-pay

## 2023-04-17 ENCOUNTER — Ambulatory Visit
Admission: RE | Admit: 2023-04-17 | Discharge: 2023-04-17 | Disposition: A | Discharge status: Home / Self Care | Source: Ambulatory Visit

## 2023-04-17 VITALS — BP 128/88 | HR 83 | Temp 97.9°F | Resp 18

## 2023-04-17 DIAGNOSIS — S39012A Strain of muscle, fascia and tendon of lower back, initial encounter: Secondary | ICD-10-CM

## 2023-04-17 MED ORDER — BACLOFEN 20 MG PO TABS: 20.0000 mg | ORAL_TABLET | Freq: Three times a day (TID) | ORAL | 0 refills | Status: DC

## 2023-04-17 NOTE — ED Triage Notes (Signed)
 Patient presents to [facility] for evaluation of 1 week of right lower back pain.  She had been having some very mild lower bilateral back pain "for a while", but one day was putting her toddler twins down to sleep and when she finished with everything, felt like her right lower back/buttocks began to hurt more intensely.  That has not resolved in the past week with icy hot or heating pad.  Says it "feels deeper" than they will reach.  Ibuprofen helps some.  Denies changes in bowel or bladder.  Worse with sitting, resolves with standing/walking

## 2023-04-17 NOTE — Discharge Instructions (Addendum)
 1. Acute myofascial strain of lumbar region, initial encounter (Primary) - baclofen (LIORESAL) 20 MG tablet; Take 1 tablet (20 mg total) by mouth 3 (three) times daily.  Dispense: 30 each; Refill: 0 - Take ibuprofen 600 mg to 800 mg every 6-8 hours for back pain and inflammation secondary to muscle strain. - Limit heavy lifting is much as possible, no strenuous physical activity until symptoms improved. - Recommend getting a massage or chiropractic adjustment to relieve muscle tension and pain

## 2023-04-17 NOTE — ED Provider Notes (Signed)
 [facility]  Note:  This document was prepared using Conservation officer, historic buildings and may include unintentional dictation errors.  MRN: [id number] DOB: December 01, 1990  Subjective:   Samantha Glass is a 33 y.o. female presenting for right-sided lower back pain x 1 week.  Patient denies any known trauma or injury but states that she has to toddlers at home and may have injured back lifting and carrying them.  Patient has been taking ibuprofen 200 mg with minimal improvement.  Using IcyHot, heating pad with no big improvement.  Patient reports the pain is worse with sitting, does not notice pain when she is walking or standing.  Denies any radiculopathy to lower extremities.  No current facility-administered medications for this encounter.  Current Outpatient Medications:    baclofen (LIORESAL) 20 MG tablet, Take 1 tablet (20 mg total) by mouth 3 (three) times daily., Disp: 30 each, Rfl: 0   buPROPion (WELLBUTRIN XL) 300 MG 24 hr tablet, Take 1 tablet (300 mg total) by mouth daily., Disp: 90 tablet, Rfl: 3   hydrochlorothiazide (HYDRODIURIL) 25 MG tablet, Take 1 tablet (25 mg total) by mouth daily., Disp: 90 tablet, Rfl: 2  Facility-Administered Medications Ordered in Other Encounters:    buPROPion (WELLBUTRIN XL) 24 hr tablet 300 mg, 300 mg, Oral, Daily, Alben Alma, MD   No Known Allergies  Past Medical History:  Diagnosis Date   Depression    Depression, major, single episode, 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 Procedure 1: Baby A   VAGINAL DELIVERY  12/23/2020   Procedure: VAGINAL DELIVERY;  Surgeon: Alben Alma, MD;  Location: [facility] ORS;  Service: Obstetrics;;  Twin Delivery in OR. Procedure 2: Baby B    Family History  Problem Relation Age of Onset   Hypertension Mother    Depression Mother    Hypertension Maternal Grandmother    Arthritis Maternal Grandmother    Diabetes Paternal Grandmother    Hypertension Paternal Grandmother    Hyperlipidemia Paternal Grandmother    Hypertension Paternal Grandfather     Social History   Tobacco Use   Smoking status: Never   Smokeless tobacco: Never  Vaping Use   Vaping status: Never Used  Substance Use Topics   Alcohol use: Not Currently    Comment: occassional   Drug use: No    ROS Refer to HPI for ROS details.  Objective:   Vitals: BP 128/88 (BP Location: Left Arm)   Pulse 83   Temp 97.9 F (36.6 C) (Temporal)   Resp 18   LMP 04/07/2023   SpO2 98%   Physical Exam Vitals and nursing note reviewed.  Constitutional:      General: She is not in acute distress.    Appearance: She is well-developed. She is not ill-appearing or toxic-appearing.  HENT:     Head: Normocephalic  and atraumatic.     Mouth/Throat:     Mouth: Mucous membranes are moist.  Cardiovascular:     Rate and Rhythm: Normal rate.  Pulmonary:     Effort: Pulmonary effort is normal. No respiratory distress.  Musculoskeletal:        General: Normal range of motion.     Lumbar back: Swelling, spasms and tenderness present. No bony tenderness. Normal range of motion.  Skin:    General: Skin is warm and dry.  Neurological:     General: No focal deficit present.     Mental Status: She is alert and oriented to person, place, and time.  Psychiatric:        Mood and Affect: Mood normal.        Behavior:  Behavior normal.     Procedures  No results found for this or any previous visit (from the past 24 hours).  No results found.   Assessment and Plan :     Discharge Instructions      1. Acute myofascial strain of lumbar region, initial encounter (Primary) - baclofen (LIORESAL) 20 MG tablet; Take 1 tablet (20 mg total) by mouth 3 (three) times daily.  Dispense: 30 each; Refill: 0 - Take ibuprofen 600 mg to 800 mg every 6-8 hours for back pain and inflammation secondary to muscle strain. - Limit heavy lifting is much as possible, no strenuous physical activity until symptoms improved. - Recommend getting a massage or chiropractic adjustment to relieve muscle tension and pain        Margette Sheldon, [place] B, NP 04/17/23 1139

## 2023-05-05 ENCOUNTER — Other Ambulatory Visit: Payer: Self-pay | Admitting: Nurse Practitioner

## 2023-05-23 ENCOUNTER — Other Ambulatory Visit: Payer: 59

## 2023-05-29 ENCOUNTER — Telehealth: Payer: Self-pay | Admitting: Nurse Practitioner

## 2023-05-29 NOTE — Telephone Encounter (Signed)
 Patient needs fasting labs ordered per notes please

## 2023-05-29 NOTE — Telephone Encounter (Signed)
 Patient needs fasting lab order (per note)

## 2023-06-07 ENCOUNTER — Other Ambulatory Visit: Payer: Self-pay

## 2023-06-07 ENCOUNTER — Other Ambulatory Visit (INDEPENDENT_AMBULATORY_CARE_PROVIDER_SITE_OTHER)

## 2023-06-07 ENCOUNTER — Other Ambulatory Visit: Payer: Self-pay | Admitting: Nurse Practitioner

## 2023-06-07 DIAGNOSIS — D649 Anemia, unspecified: Secondary | ICD-10-CM | POA: Diagnosis not present

## 2023-06-07 LAB — CBC WITH DIFFERENTIAL/PLATELET
Basophils Absolute: 0 10*3/uL (ref 0.0–0.1)
Basophils Relative: 0.4 % (ref 0.0–3.0)
Eosinophils Absolute: 0.1 10*3/uL (ref 0.0–0.7)
Eosinophils Relative: 1.9 % (ref 0.0–5.0)
HCT: 35.1 % — ABNORMAL LOW (ref 36.0–46.0)
Hemoglobin: 11.6 g/dL — ABNORMAL LOW (ref 12.0–15.0)
Lymphocytes Relative: 30.9 % (ref 12.0–46.0)
Lymphs Abs: 2.1 10*3/uL (ref 0.7–4.0)
MCHC: 33.1 g/dL (ref 30.0–36.0)
MCV: 83.8 fl (ref 78.0–100.0)
Monocytes Absolute: 0.5 10*3/uL (ref 0.1–1.0)
Monocytes Relative: 7 % (ref 3.0–12.0)
Neutro Abs: 4.1 10*3/uL (ref 1.4–7.7)
Neutrophils Relative %: 59.8 % (ref 43.0–77.0)
Platelets: 277 10*3/uL (ref 150.0–400.0)
RBC: 4.18 Mil/uL (ref 3.87–5.11)
RDW: 15.9 % — ABNORMAL HIGH (ref 11.5–15.5)
WBC: 6.8 10*3/uL (ref 4.0–10.5)

## 2023-06-08 LAB — IRON,TIBC AND FERRITIN PANEL
%SAT: 9 % — ABNORMAL LOW (ref 16–45)
Ferritin: 10 ng/mL — ABNORMAL LOW (ref 16–154)
Iron: 37 ug/dL — ABNORMAL LOW (ref 40–190)
TIBC: 390 ug/dL (ref 250–450)

## 2023-06-24 ENCOUNTER — Ambulatory Visit: Payer: Self-pay | Admitting: Nurse Practitioner

## 2023-10-12 ENCOUNTER — Other Ambulatory Visit: Payer: Self-pay | Admitting: Nurse Practitioner

## 2023-12-05 ENCOUNTER — Telehealth: Admitting: Physician Assistant

## 2023-12-05 DIAGNOSIS — R3989 Other symptoms and signs involving the genitourinary system: Secondary | ICD-10-CM | POA: Diagnosis not present

## 2023-12-05 MED ORDER — CEPHALEXIN 500 MG PO CAPS: 500.0000 mg | ORAL_CAPSULE | Freq: Two times a day (BID) | ORAL | 0 refills | Status: AC

## 2023-12-05 NOTE — Patient Instructions (Addendum)
 Samantha Glass, thank you for joining Samantha Velma Lunger, PA-C for today's virtual visit.  While this provider is not your primary care provider (PCP), if your PCP is located in our provider database this encounter information will be shared with them immediately following your visit.   A [facility] MyChart account gives you access to today's visit and all your visits, tests, and labs performed at [facility]  click here if you don't have a [facility] MyChart account or go to [email or web address]  Consent: (Patient) Samantha Glass provided verbal consent for this virtual visit at the beginning of the encounter.  Current Medications:  Current Outpatient Medications:    baclofen  (LIORESAL ) 20 MG tablet, Take 1 tablet (20 mg total) by mouth 3 (three) times daily., Disp: 30 each, Rfl: 0   buPROPion  (WELLBUTRIN  XL) 300 MG 24 hr tablet, TAKE 1 TABLET BY MOUTH EVERY DAY, Disp: 90 tablet, Rfl: 1   hydrochlorothiazide  (HYDRODIURIL ) 25 MG tablet, TAKE 1 TABLET (25 MG TOTAL) BY MOUTH DAILY., Disp: 90 tablet, Rfl: 2 No current facility-administered medications for this visit.  Facility-Administered Medications Ordered in Other Visits:    buPROPion  (WELLBUTRIN  XL) 24 hr tablet 300 mg, 300 mg, Oral, Daily, Harris, Lamar SQUIBB, MD   Medications ordered in this encounter:  No orders of the defined types were placed in this encounter.    *If you need refills on other medications prior to your next appointment, please contact your pharmacy*  Follow-Up: Call back or seek an in-person evaluation if the symptoms worsen or if the condition fails to improve as anticipated.   Virtual Care [phone]  Other Instructions Your symptoms are consistent with a bladder infection, also called acute cystitis. Please take your antibiotic (Keflex) as directed until all pills are gone.  Stay very well hydrated.  Consider a daily probiotic (Align, Culturelle, or Activia) to help  prevent stomach upset caused by the antibiotic.  Taking a probiotic daily may also help prevent recurrent UTIs.  Also consider taking AZO (Phenazopyridine) tablets to help decrease pain with urination.  If you note any non-resolving, new, or worsening symptoms despite treatment, please seek an in-person evaluation ASAP.  Urinary Tract Infection A urinary tract infection (UTI) can occur any place along the urinary tract. The tract includes the kidneys, ureters, bladder, and urethra. A type of germ called bacteria often causes a UTI. UTIs are often helped with antibiotic medicine.  HOME CARE  If given, take antibiotics as told by your doctor. Finish them even if you start to feel better. Drink enough fluids to keep your pee (urine) clear or pale yellow. Avoid tea, drinks with caffeine, and bubbly (carbonated) drinks. Pee often. Avoid holding your pee in for a long time. Pee before and after having sex (intercourse). Wipe from front to back after you poop (bowel movement) if you are a woman. Use each tissue only once. GET HELP RIGHT AWAY IF:  You have back pain. You have lower belly (abdominal) pain. You have chills. You feel sick to your stomach (nauseous). You throw up (vomit). Your burning or discomfort with peeing does not go away. You have a fever. Your symptoms are not better in 3 days. MAKE SURE YOU:  Understand these instructions. Will watch your condition. Will get help right away if you are not doing well or get worse. Document Released: 06/06/2007 Document Revised: 09/12/2011 Document Reviewed: 07/19/2011 [facility] Patient Information 2015 [place], [place]. This information is not intended to replace advice given to  you by your health care provider. Make sure you discuss any questions you have with your health care provider.    If you have been instructed to have an in-person evaluation today at a local Urgent Care facility, please use the link below. It will take you to a list of  all of our available [facility], including address, phone number and hours of operation. Please do not delay care.  [facility]  If you or a family member do not have a primary care provider, use the link below to schedule a visit and establish care. When you choose a [facility] primary care physician or advanced practice provider, you gain a long-term partner in health. Find a Primary Care Provider  Learn more about [facility] in-office and virtual care options: [facility] - Get Care Now

## 2023-12-05 NOTE — Progress Notes (Signed)
 Virtual Visit Consent   Samantha Glass, you are scheduled for a virtual visit with a [facility] provider today. Just as with appointments in the office, your consent must be obtained to participate. Your consent will be active for this visit and any virtual visit you may have with one of our providers in the next 365 days. If you have a MyChart account, a copy of this consent can be sent to you electronically.  As this is a virtual visit, video technology does not allow for your provider to perform a traditional examination. This may limit your provider's ability to fully assess your condition. If your provider identifies any concerns that need to be evaluated in person or the need to arrange testing (such as labs, EKG, etc.), we will make arrangements to do so. Although advances in technology are sophisticated, we cannot ensure that it will always work on either your end or our end. If the connection with a video visit is poor, the visit may have to be switched to a telephone visit. With either a video or telephone visit, we are not always able to ensure that we have a secure connection.  By engaging in this virtual visit, you consent to the provision of healthcare and authorize for your insurance to be billed (if applicable) for the services provided during this visit. Depending on your insurance coverage, you may receive a charge related to this service.  I need to obtain your verbal consent now. Are you willing to proceed with your visit today? Samantha Glass has provided verbal consent on 12/05/2023 for a virtual visit (video or telephone). Samantha Glass, [place]  Date: 12/05/2023 7:21 PM   Virtual Visit via Video Note   I, Samantha Glass, connected with  Samantha Glass  ([id number], 17-Jul-1990) on 12/05/23 at  7:15 PM EST by a video-enabled telemedicine application and verified that I am speaking with the correct person using two identifiers.  Location: Patient: Virtual Visit  Location Patient: Home Provider: Virtual Visit Location Provider: Home Office   I discussed the limitations of evaluation and management by telemedicine and the availability of in person appointments. The patient expressed understanding and agreed to proceed.    History of Present Illness: Samantha Glass is a 33 y.o. who identifies as a female who was assigned female at birth, and is being seen today for follow-up questions from this morning's video visit with this provider. Notes after visit for UTI she noted some mild left side pain and left back pain. Went to an UC following my instructions from this morning regarding worsening symptoms, but notes she was told by desk staff that they could not diagnose a kidney infection, she would need to go to ER. Patient notes still no fever, chills, nausea or vomiting. No worsening of her UTI symptoms. Mild pain at present. Did pick up and start her antibiotic Keflex this afternoon. She wanted to make sure nothing else she should do tonight.  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   Petechiae 06/21/2019   Depression, major, single episode, complete remission 04/14/2019   IDA (iron  deficiency anemia) 04/14/2019   Essential hypertension 05/14/2012    Allergies: No Known Allergies Medications:  Current Outpatient Medications:    buPROPion  (WELLBUTRIN  XL) 300 MG 24 hr  tablet, TAKE 1 TABLET BY MOUTH EVERY DAY, Disp: 90 tablet, Rfl: 1   cephALEXin (KEFLEX) 500 MG capsule, Take 1 capsule (500 mg total) by mouth 2 (two) times daily for 7 days., Disp: 14 capsule, Rfl: 0   hydrochlorothiazide  (HYDRODIURIL ) 25 MG tablet, TAKE 1 TABLET (25 MG TOTAL) BY MOUTH DAILY., Disp: 90 tablet, Rfl: 2  Observations/Objective: Patient is well-developed, well-nourished in no acute distress.  Resting comfortably  at home.  Head is normocephalic, atraumatic.  No labored breathing.  Speech is clear and coherent with logical content.  Patient is alert and oriented at baseline.   Assessment and Plan: 1. Suspected UTI (Primary)  Some back/side pain without true flank pain. In absence of fever, chills, nausea or vomiting, no need for ER evaluation this evening (since PCP office and UC are closed now). She has taken one dose of antibiotic. Will have her continue as directed, hydrate and rest. Will follow-up with her first thing in the morning to make sure symptoms have plateau and no new or worsening symptoms. She is aware if anything worsens overnight -- especially any fever or vomiting -- she would need to go to the ER.   No charge for this follow-up since seen this AM.  Follow Up Instructions: I discussed the assessment and treatment plan with the patient. The patient was provided an opportunity to ask questions and all were answered. The patient agreed with the plan and demonstrated an understanding of the instructions.  A copy of instructions were sent to the patient via MyChart unless otherwise noted below.   The patient was advised to call back or seek an in-person evaluation if the symptoms worsen or if the condition fails to improve as anticipated.    Samantha Velma Lunger, PA-C

## 2023-12-05 NOTE — Patient Instructions (Signed)
  Samantha Glass, thank you for joining Samantha Velma Lunger, PA-C for today's virtual visit.  While this provider is not your primary care provider (PCP), if your PCP is located in our provider database this encounter information will be shared with them immediately following your visit.   A [facility] MyChart account gives you access to today's visit and all your visits, tests, and labs performed at [facility]  click here if you don't have a [facility] MyChart account or go to [email or web address]  Consent: (Patient) Samantha Glass provided verbal consent for this virtual visit at the beginning of the encounter.  Current Medications:  Current Outpatient Medications:    buPROPion  (WELLBUTRIN  XL) 300 MG 24 hr tablet, TAKE 1 TABLET BY MOUTH EVERY DAY, Disp: 90 tablet, Rfl: 1   cephALEXin (KEFLEX) 500 MG capsule, Take 1 capsule (500 mg total) by mouth 2 (two) times daily for 7 days., Disp: 14 capsule, Rfl: 0   hydrochlorothiazide  (HYDRODIURIL ) 25 MG tablet, TAKE 1 TABLET (25 MG TOTAL) BY MOUTH DAILY., Disp: 90 tablet, Rfl: 2   Medications ordered in this encounter:  No orders of the defined types were placed in this encounter.    *If you need refills on other medications prior to your next appointment, please contact your pharmacy*  Follow-Up: Call back or seek an in-person evaluation if the symptoms worsen or if the condition fails to improve as anticipated.  [facility] [phone]  Other Instructions Continue care discussed this morning.  We will follow-up in the morning to make sure leveling out and improving. Any new onset of fever or vomiting -- you would need to be evaluated at nearest ER.    If you have been instructed to have an in-person evaluation today at a local Urgent Care facility, please use the link below. It will take you to a list of all of our available [facility], including address, phone number and hours of  operation. Please do not delay care.  [facility]  If you or a family member do not have a primary care provider, use the link below to schedule a visit and establish care. When you choose a [facility] primary care physician or advanced practice provider, you gain a long-term partner in health. Find a Primary Care Provider  Learn more about [facility]'s in-office and virtual care options: [facility] - Get Care Now

## 2023-12-05 NOTE — Progress Notes (Signed)
 Virtual Visit Consent   Samantha Glass, you are scheduled for a virtual visit with a [facility] provider today. Just as with appointments in the office, your consent must be obtained to participate. Your consent will be active for this visit and any virtual visit you may have with one of our providers in the next 365 days. If you have a MyChart account, a copy of this consent can be sent to you electronically.  As this is a virtual visit, video technology does not allow for your provider to perform a traditional examination. This may limit your provider's ability to fully assess your condition. If your provider identifies any concerns that need to be evaluated in person or the need to arrange testing (such as labs, EKG, etc.), we will make arrangements to do so. Although advances in technology are sophisticated, we cannot ensure that it will always work on either your end or our end. If the connection with a video visit is poor, the visit may have to be switched to a telephone visit. With either a video or telephone visit, we are not always able to ensure that we have a secure connection.  By engaging in this virtual visit, you consent to the provision of healthcare and authorize for your insurance to be billed (if applicable) for the services provided during this visit. Depending on your insurance coverage, you may receive a charge related to this service.  I need to obtain your verbal consent now. Are you willing to proceed with your visit today? [name] has provided verbal consent on 12/05/2023 for a virtual visit (video or telephone). Samantha Glass, [place]  Date: 12/05/2023 11:19 AM   Virtual Visit via Video Note   I, Samantha Glass, connected with  Samantha Glass  ([id number], 1990/11/09) on 12/05/23 at 11:15 AM EST by a video-enabled telemedicine application and verified that I am speaking with the correct person using two identifiers.  Location: Patient: Virtual Visit  Location Patient: Home Provider: Virtual Visit Location Provider: Home Office   I discussed the limitations of evaluation and management by telemedicine and the availability of in person appointments. The patient expressed understanding and agreed to proceed.    History of Present Illness: Samantha Glass is a 33 y.o. who identifies as a female who was assigned female at birth, and is being seen today for possible UTI. Endorses symptom onset a week ago, initially with dysuria, suprapubic cramping. Samantha Glass Has history of UTI so knows to start hydration and often will flush it out. Notes symptoms resolved for a few days and then recurred. Currently with dysuria, urgency, suprapubic pressure. Denies nausea or vomiting. Denies fever, chills. LMP ended November 14th.  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   Petechiae 06/21/2019   Depression, major, single episode, complete remission 04/14/2019   IDA (iron  deficiency anemia) 04/14/2019   Essential hypertension 05/14/2012    Allergies: No Known  Allergies Medications:  Current Outpatient Medications:    cephALEXin (KEFLEX) 500 MG capsule, Take 1 capsule (500 mg total) by mouth 2 (two) times daily for 7 days., Disp: 14 capsule, Rfl: 0   buPROPion  (WELLBUTRIN  XL) 300 MG 24 hr tablet, TAKE 1 TABLET BY MOUTH EVERY DAY, Disp: 90 tablet, Rfl: 1   hydrochlorothiazide   (HYDRODIURIL ) 25 MG tablet, TAKE 1 TABLET (25 MG TOTAL) BY MOUTH DAILY., Disp: 90 tablet, Rfl: 2  Observations/Objective: Patient is well-developed, well-nourished in no acute distress.  Resting comfortably  at home.  Head is normocephalic, atraumatic.  No labored breathing.  Speech is clear and coherent with logical content.  Patient is alert and oriented at baseline.   Assessment and Plan: 1. Suspected UTI (Primary) - cephALEXin (KEFLEX) 500 MG capsule; Take 1 capsule (500 mg total) by mouth 2 (two) times daily for 7 days.  Dispense: 14 capsule; Refill: 0  Classic UTI symptoms with absence of alarm signs or symptoms. Prior history of UTI. Will treat empirically with Keflex for suspected uncomplicated cystitis. Supportive measures and OTC medications reviewed. Strict in-person evaluation precautions discussed.    Follow Up Instructions: I discussed the assessment and treatment plan with the patient. The patient was provided an opportunity to ask questions and all were answered. The patient agreed with the plan and demonstrated an understanding of the instructions.  A copy of instructions were sent to the patient via MyChart unless otherwise noted below.   The patient was advised to call back or seek an in-person evaluation if the symptoms worsen or if the condition fails to improve as anticipated.    Samantha Velma Lunger, PA-C

## 2023-12-12 ENCOUNTER — Encounter: Payer: Self-pay | Admitting: Nurse Practitioner

## 2023-12-12 NOTE — Telephone Encounter (Signed)
 Yes, she needs to get evaluated.

## 2023-12-12 NOTE — Telephone Encounter (Signed)
Patient said she will go to Urgent Care.

## 2023-12-12 NOTE — Telephone Encounter (Signed)
 Noted

## 2023-12-13 ENCOUNTER — Other Ambulatory Visit: Payer: Self-pay | Admitting: Nurse Practitioner

## 2023-12-13 DIAGNOSIS — D649 Anemia, unspecified: Secondary | ICD-10-CM

## 2023-12-13 DIAGNOSIS — E559 Vitamin D deficiency, unspecified: Secondary | ICD-10-CM

## 2023-12-13 DIAGNOSIS — I10 Essential (primary) hypertension: Secondary | ICD-10-CM

## 2023-12-13 NOTE — Telephone Encounter (Signed)
 It is okay for her to take nitrofurantoin. She is due for OV please schedule her for OV and labs 2 day prior.

## 2023-12-13 NOTE — Telephone Encounter (Signed)
 Patient is aware that it is ok to take the medication from UC. Patient is scheduled for lab on 01/29/23 and a follow up on 01/31/23.

## 2023-12-13 NOTE — Telephone Encounter (Signed)
 Noted! Thank you

## 2024-01-29 ENCOUNTER — Other Ambulatory Visit (INDEPENDENT_AMBULATORY_CARE_PROVIDER_SITE_OTHER)

## 2024-01-29 ENCOUNTER — Ambulatory Visit: Payer: Self-pay | Admitting: Nurse Practitioner

## 2024-01-29 DIAGNOSIS — D649 Anemia, unspecified: Secondary | ICD-10-CM | POA: Diagnosis not present

## 2024-01-29 DIAGNOSIS — E559 Vitamin D deficiency, unspecified: Secondary | ICD-10-CM | POA: Diagnosis not present

## 2024-01-29 DIAGNOSIS — I10 Essential (primary) hypertension: Secondary | ICD-10-CM | POA: Diagnosis not present

## 2024-01-29 LAB — COMPREHENSIVE METABOLIC PANEL WITH GFR
ALT: 12 U/L (ref 3–35)
AST: 13 U/L (ref 5–37)
Albumin: 4.5 g/dL (ref 3.5–5.2)
Alkaline Phosphatase: 39 U/L (ref 39–117)
BUN: 8 mg/dL (ref 6–23)
CO2: 30 meq/L (ref 19–32)
Calcium: 9.5 mg/dL (ref 8.4–10.5)
Chloride: 101 meq/L (ref 96–112)
Creatinine, Ser: 0.74 mg/dL (ref 0.40–1.20)
GFR: 106.21 mL/min
Glucose, Bld: 95 mg/dL (ref 70–99)
Potassium: 3.5 meq/L (ref 3.5–5.1)
Sodium: 137 meq/L (ref 135–145)
Total Bilirubin: 0.6 mg/dL (ref 0.2–1.2)
Total Protein: 7.6 g/dL (ref 6.0–8.3)

## 2024-01-29 LAB — CBC WITH DIFFERENTIAL/PLATELET
Basophils Absolute: 0 10*3/uL (ref 0.0–0.1)
Basophils Relative: 0.2 % (ref 0.0–3.0)
Eosinophils Absolute: 0.1 10*3/uL (ref 0.0–0.7)
Eosinophils Relative: 1.9 % (ref 0.0–5.0)
HCT: 37.7 % (ref 36.0–46.0)
Hemoglobin: 13 g/dL (ref 12.0–15.0)
Lymphocytes Relative: 25 % (ref 12.0–46.0)
Lymphs Abs: 1.7 10*3/uL (ref 0.7–4.0)
MCHC: 34.4 g/dL (ref 30.0–36.0)
MCV: 88.5 fl (ref 78.0–100.0)
Monocytes Absolute: 0.4 10*3/uL (ref 0.1–1.0)
Monocytes Relative: 5.5 % (ref 3.0–12.0)
Neutro Abs: 4.6 10*3/uL (ref 1.4–7.7)
Neutrophils Relative %: 67.4 % (ref 43.0–77.0)
Platelets: 262 10*3/uL (ref 150.0–400.0)
RBC: 4.26 Mil/uL (ref 3.87–5.11)
RDW: 12.9 % (ref 11.5–15.5)
WBC: 6.8 10*3/uL (ref 4.0–10.5)

## 2024-01-29 LAB — IRON,TIBC AND FERRITIN PANEL
%SAT: 18 % (ref 16–45)
Ferritin: 20 ng/mL (ref 16–154)
Iron: 76 ug/dL (ref 40–190)
TIBC: 411 ug/dL (ref 250–450)

## 2024-01-29 LAB — VITAMIN D 25 HYDROXY (VIT D DEFICIENCY, FRACTURES): VITD: 31.7 ng/mL (ref 30.00–100.00)

## 2024-01-31 ENCOUNTER — Ambulatory Visit: Admitting: Nurse Practitioner

## 2024-01-31 VITALS — BP 118/78 | HR 72 | Temp 98.7°F | Ht 71.0 in | Wt 225.4 lb

## 2024-01-31 DIAGNOSIS — F32A Depression, unspecified: Secondary | ICD-10-CM | POA: Diagnosis not present

## 2024-01-31 DIAGNOSIS — I10 Essential (primary) hypertension: Secondary | ICD-10-CM | POA: Diagnosis not present

## 2024-01-31 DIAGNOSIS — N920 Excessive and frequent menstruation with regular cycle: Secondary | ICD-10-CM | POA: Diagnosis not present

## 2024-01-31 MED ORDER — HYDROCHLOROTHIAZIDE 25 MG PO TABS: 25.0000 mg | ORAL_TABLET | Freq: Every day | ORAL | 2 refills | Status: AC

## 2024-01-31 NOTE — Assessment & Plan Note (Deleted)
 [name]

## 2024-01-31 NOTE — Assessment & Plan Note (Signed)
 Depression well-managed with Wellbutrin . - Continue Wellbutrin  300 mg daily

## 2024-01-31 NOTE — Progress Notes (Signed)
 "  Established Patient Office Visit  Subjective:  Patient ID: Samantha Glass, female    DOB: July 21, 1990  Age: 33 y.o. MRN: [id number]  CC:  Chief Complaint  Patient presents with   Medical Management of Chronic Issues   Discussed the use of AI scribe software for clinical note transcription with the patient, who gave verbal consent to proceed.  History of Present Illness   History of Present Illness   Samantha Glass is a 34 year old female who presents for a routine follow-up.  She manages iron  deficiency anemia with ferrous sulfate  325 mg twice daily and her iron  levels have improved. Heavy menstrual bleeding with clotting, cramping, and severe headaches persists, with six-day periods and heaviest flow mid-cycle, though slightly improved from a few months ago. She has not seen a gynecologist since delivery three years ago and uses a copper  IUD (Paragard ) for contraception. She stopped hormonal contraceptives in the past due to weight gain.  Her vitamin D  level was 31 ng/mL. She is not taking vitamin D  supplements and has limited sun exposure in colder weather.  She takes Wellbutrin  for depression. She is considering tapering or stopping it but is unsure how she would feel without it.  She has been eating healthier and exercising, with weight decrease from 245 lb before pregnancy to 225 lb currently.  She received COVID and influenza vaccines earlier this year at [facility].   She denies leg swelling, constipation, or diarrhea.  Labs reviewed    Past Medical History:  Diagnosis Date   Depression    Depression, major, single episode, complete remission 04/14/2019   Hepatitis    middle school age   Hypertension    IDA (iron  deficiency anemia) 04/14/2019   Obesity (BMI 30.0-34.9) 04/14/2019   UTI (urinary tract infection)     Past Surgical History:  Procedure Laterality Date   GALLBLADDER SURGERY  2021   TONSILLECTOMY  age 24   UMBILICAL HERNIA REPAIR N/A 11/24/2019    Procedure: HERNIA REPAIR UMBILICAL ADULT, open;  Surgeon: Desiderio Schanz, MD;  Location: [facility] ORS;  Service: General;  Laterality: N/A;   VAGINAL DELIVERY  12/23/2020   Procedure: VAGINAL DELIVERY;  Surgeon: Arloa Lamar SQUIBB, MD;  Location: [facility] ORS;  Service: Obstetrics;;  Twin Delivery in OR. Procedure 1: Baby A   VAGINAL DELIVERY  12/23/2020   Procedure: VAGINAL DELIVERY;  Surgeon: Arloa Lamar SQUIBB, MD;  Location: [facility] ORS;  Service: Obstetrics;;  Twin Delivery in OR. Procedure 2: Baby B    Family History  Problem Relation Age of Onset   Hypertension Mother    Depression Mother    Hypertension Maternal Grandmother    Arthritis Maternal Grandmother    Diabetes Paternal Grandmother    Hypertension Paternal Grandmother    Hyperlipidemia Paternal Grandmother    Hypertension Paternal Grandfather     Social History   Socioeconomic History   Marital status: Married    Spouse name: Not on file   Number of children: Not on file   Years of education: Not on file   Highest education level: Doctorate  Occupational History   Not on file  Tobacco Use   Smoking status: Never   Smokeless tobacco: Never  Vaping Use   Vaping status: Never Used  Substance and Sexual Activity   Alcohol use: Not Currently    Comment: occassional   Drug use: No   Sexual activity: Yes    Birth control/protection: None    Comment: undecided  Other Topics  Concern   Not on file  Social History Narrative   Not on file   Social Drivers of Health   Tobacco Use: Low Risk (01/31/2024)   Patient History    Smoking Tobacco Use: Never    Smokeless Tobacco Use: Never    Passive Exposure: Not on file  Financial Resource Strain: Low Risk (01/31/2024)   Overall Financial Resource Strain (CARDIA)    Difficulty of Paying Living Expenses: Not hard at all  Food Insecurity: No Food Insecurity (01/31/2024)   Epic    Worried About Programme Researcher, Broadcasting/film/video in the Last Year: Never true    Ran Out of Food in the Last Year:  Never true  Transportation Needs: No Transportation Needs (01/31/2024)   Epic    Lack of Transportation (Medical): No    Lack of Transportation (Non-Medical): No  Physical Activity: Insufficiently Active (01/31/2024)   Exercise Vital Sign    Days of Exercise per Week: 4 days    Minutes of Exercise per Session: 20 min  Stress: No Stress Concern Present (01/31/2024)   [employer] of Occupational Health - Occupational Stress Questionnaire    Feeling of Stress: Not at all  Social Connections: Socially Isolated (01/31/2024)   Social Connection and Isolation Panel    Frequency of Communication with Friends and Family: Once a week    Frequency of Social Gatherings with Friends and Family: Once a week    Attends Religious Services: Patient declined    Active Member of Clubs or Organizations: No    Attends Engineer, Structural: Not on file    Marital Status: Married  Catering Manager Violence: Not on file  Depression (PHQ2-9): Low Risk (01/31/2024)   Depression (PHQ2-9)    PHQ-2 Score: 2  Alcohol Screen: Low Risk (01/31/2024)   Alcohol Screen    Last Alcohol Screening Score (AUDIT): 1  Housing: Low Risk (01/31/2024)   Epic    Unable to Pay for Housing in the Last Year: No    Number of Times Moved in the Last Year: 0    Homeless in the Last Year: No  Utilities: Not on file  Health Literacy: Not on file     Outpatient Medications Prior to Visit  Medication Sig Dispense Refill   buPROPion  (WELLBUTRIN  XL) 300 MG 24 hr tablet TAKE 1 TABLET BY MOUTH EVERY DAY 90 tablet 1   hydrochlorothiazide  (HYDRODIURIL ) 25 MG tablet TAKE 1 TABLET (25 MG TOTAL) BY MOUTH DAILY. 90 tablet 2   No facility-administered medications prior to visit.    Allergies[1]  ROS Review of Systems Negative unless indicated in HPI.    Objective:    Physical Exam Constitutional:      Appearance: Normal appearance.  HENT:     Mouth/Throat:     Mouth: Mucous membranes are moist.  Eyes:      Conjunctiva/sclera: Conjunctivae normal.     Pupils: Pupils are equal, round, and reactive to light.  Cardiovascular:     Rate and Rhythm: Normal rate and regular rhythm.     Pulses: Normal pulses.     Heart sounds: Normal heart sounds.  Pulmonary:     Effort: Pulmonary effort is normal.     Breath sounds: Normal breath sounds.  Abdominal:     General: Bowel sounds are normal.     Palpations: Abdomen is soft. There is no mass.     Tenderness: There is no guarding.  Musculoskeletal:     Cervical back: Normal range of motion. No  tenderness.  Skin:    General: Skin is warm.     Findings: No bruising.  Neurological:     General: No focal deficit present.     Mental Status: She is alert and oriented to person, place, and time. Mental status is at baseline.  Psychiatric:        Mood and Affect: Mood normal.        Behavior: Behavior normal.        Thought Content: Thought content normal.        Judgment: Judgment normal.     BP 118/78   Pulse 72   Temp 98.7 F (37.1 C)   Ht 5' 11 (1.803 m)   Wt 225 lb 6.4 oz (102.2 kg)   SpO2 97%   BMI 31.44 kg/m  Wt Readings from Last 3 Encounters:  01/31/24 225 lb 6.4 oz (102.2 kg)  02/22/23 235 lb 6.4 oz (106.8 kg)  01/11/23 237 lb (107.5 kg)     Health Maintenance  Topic Date Due   Hepatitis B Vaccines 19-59 Average Risk (2 of 3 - 3-dose series) 04/23/2001   COVID-19 Vaccine (6 - 2025-26 season) 09/02/2023   Cervical Cancer Screening (HPV/Pap Cotest)  02/02/2026   DTaP/Tdap/Td (5 - Td or Tdap) 11/26/2030   Influenza Vaccine  Completed   HPV VACCINES  Completed   Hepatitis C Screening  Completed   HIV Screening  Completed   Pneumococcal Vaccine  Aged Out   Meningococcal B Vaccine  Aged Out       Topic Date Due   Hepatitis B Vaccines 19-59 Average Risk (2 of 3 - 3-dose series) 04/23/2001    Lab Results  Component Value Date   TSH 1.30 01/11/2023   Lab Results  Component Value Date   WBC 6.8 01/29/2024   HGB 13.0  01/29/2024   HCT 37.7 01/29/2024   MCV 88.5 01/29/2024   PLT 262.0 01/29/2024   Lab Results  Component Value Date   NA 137 01/29/2024   K 3.5 01/29/2024   CO2 30 01/29/2024   GLUCOSE 95 01/29/2024   BUN 8 01/29/2024   CREATININE 0.74 01/29/2024   BILITOT 0.6 01/29/2024   ALKPHOS 39 01/29/2024   AST 13 01/29/2024   ALT 12 01/29/2024   PROT 7.6 01/29/2024   ALBUMIN 4.5 01/29/2024   CALCIUM  9.5 01/29/2024   ANIONGAP 5 12/28/2020   EGFR 123 06/21/2020   GFR 106.21 01/29/2024   Lab Results  Component Value Date   CHOL 151 01/11/2023   Lab Results  Component Value Date   HDL 47.10 01/11/2023   Lab Results  Component Value Date   LDLCALC 84 01/11/2023   Lab Results  Component Value Date   TRIG 101.0 01/11/2023   Lab Results  Component Value Date   CHOLHDL 3 01/11/2023   Lab Results  Component Value Date   HGBA1C 5.4 06/21/2020      Assessment & Plan:   Assessment & Plan Essential hypertension Patient BP  Vitals:   01/31/24 1355  BP: 118/78  -Advised pt to follow a low sodium and heart healthy diet. - Continue hydrochlorothiazide  25 mg daily     Menorrhagia with regular cycle Persistent heavy menstrual bleeding with improved iron  levels.  - Continue iron  supplementation at 325 mg twice daily. - Referred to gynecology for further evaluation and management of heavy menstrual bleeding.    Depression, unspecified depression type Depression well-managed with Wellbutrin . - Continue Wellbutrin  300 mg daily  Assessment & Plan    General Health Maintenance Vitamin D  level low normal. COVID and flu vaccines received earlier this year. Hepatitis B vaccination status unclear. - Recommended over-the-counter vitamin D  supplementation. - Will update COVID and flu vaccine record. - Will check hepatitis B titers during next blood work to assess immunity.       Follow-up: Return in about 6 months (around 07/30/2024) for physical.   Chelsea Aurora,  NP     [1] No Known Allergies  "

## 2024-01-31 NOTE — Assessment & Plan Note (Addendum)
 Patient BP  Vitals:   01/31/24 1355  BP: 118/78  -Advised pt to follow a low sodium and heart healthy diet. - Continue hydrochlorothiazide  25 mg daily

## 2024-07-31 ENCOUNTER — Encounter: Admitting: Nurse Practitioner
